# Patient Record
Sex: Female | Born: 1985 | Race: White | Hispanic: No | Marital: Single | Smoking: Current some day smoker
Health system: Southern US, Community
[De-identification: ages and names within clinical notes are randomized; demographics above are authoritative.]

## PROBLEM LIST (undated history)

## (undated) DIAGNOSIS — J45909 Unspecified asthma, uncomplicated: Secondary | ICD-10-CM

## (undated) DIAGNOSIS — R011 Cardiac murmur, unspecified: Secondary | ICD-10-CM

## (undated) HISTORY — PX: NO PAST SURGERIES: SHX2092

---
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
## 2007-06-07 ENCOUNTER — Emergency Department: Payer: Self-pay | Admitting: Emergency Medicine

## 2007-12-30 ENCOUNTER — Ambulatory Visit: Payer: Self-pay | Admitting: Family Medicine

## 2008-01-09 ENCOUNTER — Emergency Department: Payer: Self-pay | Admitting: Emergency Medicine

## 2008-05-23 ENCOUNTER — Emergency Department: Payer: Self-pay | Admitting: Internal Medicine

## 2009-03-11 ENCOUNTER — Emergency Department: Payer: Self-pay | Admitting: Unknown Physician Specialty

## 2010-01-29 ENCOUNTER — Emergency Department: Payer: Self-pay | Admitting: Internal Medicine

## 2010-02-27 ENCOUNTER — Emergency Department: Payer: Self-pay | Admitting: Emergency Medicine

## 2010-08-21 ENCOUNTER — Emergency Department: Payer: Self-pay | Admitting: *Deleted

## 2012-02-19 ENCOUNTER — Emergency Department: Payer: Self-pay | Admitting: Emergency Medicine

## 2012-03-26 ENCOUNTER — Emergency Department: Payer: Self-pay

## 2012-03-26 LAB — URINALYSIS, COMPLETE
Glucose,UR: NEGATIVE mg/dL (ref 0–75)
Ketone: NEGATIVE
Nitrite: POSITIVE

## 2012-05-05 ENCOUNTER — Emergency Department: Payer: Self-pay | Admitting: Emergency Medicine

## 2012-05-05 LAB — URINALYSIS, COMPLETE
Bilirubin,UR: NEGATIVE
Nitrite: NEGATIVE
Ph: 8 (ref 4.5–8.0)
Protein: NEGATIVE

## 2012-05-05 LAB — CBC
HCT: 32.5 % — ABNORMAL LOW (ref 35.0–47.0)
MCHC: 35.3 g/dL (ref 32.0–36.0)
MCV: 75 fL — ABNORMAL LOW (ref 80–100)
WBC: 11.6 10*3/uL — ABNORMAL HIGH (ref 3.6–11.0)

## 2012-05-05 LAB — COMPREHENSIVE METABOLIC PANEL
Albumin: 3.4 g/dL (ref 3.4–5.0)
Alkaline Phosphatase: 84 U/L (ref 50–136)
BUN: 6 mg/dL — ABNORMAL LOW (ref 7–18)
Bilirubin,Total: 0.8 mg/dL (ref 0.2–1.0)
Calcium, Total: 9.2 mg/dL (ref 8.5–10.1)
Co2: 27 mmol/L (ref 21–32)
EGFR (African American): >60
Potassium: 3.7 mmol/L (ref 3.5–5.1)
SGOT(AST): 25 U/L (ref 15–37)
Sodium: 136 mmol/L (ref 136–145)

## 2012-08-18 ENCOUNTER — Emergency Department: Payer: Self-pay | Admitting: Emergency Medicine

## 2012-08-18 LAB — COMPREHENSIVE METABOLIC PANEL
Alkaline Phosphatase: 103 U/L (ref 50–136)
Anion Gap: 4 — ABNORMAL LOW (ref 7–16)
Calcium, Total: 9.2 mg/dL (ref 8.5–10.1)
Chloride: 102 mmol/L (ref 98–107)
Co2: 28 mmol/L (ref 21–32)
Creatinine: 0.7 mg/dL (ref 0.60–1.30)
EGFR (Non-African Amer.): >60
Glucose: 92 mg/dL (ref 65–99)
Osmolality: 265 (ref 275–301)
SGOT(AST): 17 U/L (ref 15–37)
Sodium: 134 mmol/L — ABNORMAL LOW (ref 136–145)

## 2012-08-18 LAB — CBC
MCHC: 34.2 g/dL (ref 32.0–36.0)
Platelet: 117 10*3/uL — ABNORMAL LOW (ref 150–440)
RDW: 15.1 % — ABNORMAL HIGH (ref 11.5–14.5)
WBC: 11.4 10*3/uL — ABNORMAL HIGH (ref 3.6–11.0)

## 2012-09-11 ENCOUNTER — Encounter (HOSPITAL_COMMUNITY): Payer: Self-pay | Admitting: *Deleted

## 2012-09-11 ENCOUNTER — Inpatient Hospital Stay (HOSPITAL_COMMUNITY): Payer: Medicaid Other

## 2012-09-11 ENCOUNTER — Inpatient Hospital Stay: Admit: 2012-09-11 | Payer: Medicaid Other

## 2012-09-11 ENCOUNTER — Encounter (HOSPITAL_COMMUNITY): Payer: Self-pay | Admitting: Certified Registered"

## 2012-09-11 ENCOUNTER — Inpatient Hospital Stay (HOSPITAL_COMMUNITY): Payer: Medicaid Other | Admitting: Certified Registered"

## 2012-09-11 ENCOUNTER — Encounter (HOSPITAL_COMMUNITY)
Admission: EM | Disposition: A | Discharge status: Hospice | Payer: Self-pay | Source: Other Acute Inpatient Hospital | Attending: Neurosurgery

## 2012-09-11 ENCOUNTER — Emergency Department: Payer: Self-pay | Admitting: Internal Medicine

## 2012-09-11 ENCOUNTER — Inpatient Hospital Stay (HOSPITAL_COMMUNITY)
Admission: EM | Admit: 2012-09-11 | Discharge: 2012-10-27 | DRG: 003 | Disposition: A | Discharge status: Hospice | Payer: Medicaid Other | Source: Other Acute Inpatient Hospital | Attending: Neurosurgery | Admitting: Neurosurgery

## 2012-09-11 DIAGNOSIS — E876 Hypokalemia: Secondary | ICD-10-CM

## 2012-09-11 DIAGNOSIS — R000 Tachycardia, unspecified: Secondary | ICD-10-CM | POA: Diagnosis not present

## 2012-09-11 DIAGNOSIS — I82609 Acute embolism and thrombosis of unspecified veins of unspecified upper extremity: Secondary | ICD-10-CM | POA: Diagnosis not present

## 2012-09-11 DIAGNOSIS — J9600 Acute respiratory failure, unspecified whether with hypoxia or hypercapnia: Secondary | ICD-10-CM | POA: Diagnosis not present

## 2012-09-11 DIAGNOSIS — R4182 Altered mental status, unspecified: Secondary | ICD-10-CM

## 2012-09-11 DIAGNOSIS — Z930 Tracheostomy status: Secondary | ICD-10-CM

## 2012-09-11 DIAGNOSIS — E46 Unspecified protein-calorie malnutrition: Secondary | ICD-10-CM | POA: Diagnosis not present

## 2012-09-11 DIAGNOSIS — G911 Obstructive hydrocephalus: Secondary | ICD-10-CM | POA: Diagnosis not present

## 2012-09-11 DIAGNOSIS — Q249 Congenital malformation of heart, unspecified: Secondary | ICD-10-CM

## 2012-09-11 DIAGNOSIS — N390 Urinary tract infection, site not specified: Secondary | ICD-10-CM | POA: Diagnosis not present

## 2012-09-11 DIAGNOSIS — R509 Fever, unspecified: Secondary | ICD-10-CM | POA: Diagnosis not present

## 2012-09-11 DIAGNOSIS — R1310 Dysphagia, unspecified: Secondary | ICD-10-CM | POA: Diagnosis not present

## 2012-09-11 DIAGNOSIS — I619 Nontraumatic intracerebral hemorrhage, unspecified: Secondary | ICD-10-CM | POA: Diagnosis not present

## 2012-09-11 DIAGNOSIS — L8995 Pressure ulcer of unspecified site, unstageable: Secondary | ICD-10-CM | POA: Diagnosis not present

## 2012-09-11 DIAGNOSIS — R578 Other shock: Secondary | ICD-10-CM | POA: Diagnosis not present

## 2012-09-11 DIAGNOSIS — I609 Nontraumatic subarachnoid hemorrhage, unspecified: Principal | ICD-10-CM | POA: Diagnosis present

## 2012-09-11 DIAGNOSIS — L89109 Pressure ulcer of unspecified part of back, unspecified stage: Secondary | ICD-10-CM | POA: Diagnosis not present

## 2012-09-11 DIAGNOSIS — H5702 Anisocoria: Secondary | ICD-10-CM | POA: Diagnosis not present

## 2012-09-11 DIAGNOSIS — I615 Nontraumatic intracerebral hemorrhage, intraventricular: Secondary | ICD-10-CM

## 2012-09-11 DIAGNOSIS — J189 Pneumonia, unspecified organism: Secondary | ICD-10-CM | POA: Diagnosis not present

## 2012-09-11 DIAGNOSIS — Z515 Encounter for palliative care: Secondary | ICD-10-CM

## 2012-09-11 DIAGNOSIS — D649 Anemia, unspecified: Secondary | ICD-10-CM | POA: Diagnosis not present

## 2012-09-11 DIAGNOSIS — R32 Unspecified urinary incontinence: Secondary | ICD-10-CM | POA: Diagnosis not present

## 2012-09-11 DIAGNOSIS — I5032 Chronic diastolic (congestive) heart failure: Secondary | ICD-10-CM | POA: Diagnosis present

## 2012-09-11 DIAGNOSIS — J9811 Atelectasis: Secondary | ICD-10-CM

## 2012-09-11 DIAGNOSIS — G8190 Hemiplegia, unspecified affecting unspecified side: Secondary | ICD-10-CM | POA: Diagnosis present

## 2012-09-11 DIAGNOSIS — R627 Adult failure to thrive: Secondary | ICD-10-CM

## 2012-09-11 DIAGNOSIS — IMO0002 Reserved for concepts with insufficient information to code with codable children: Secondary | ICD-10-CM | POA: Diagnosis not present

## 2012-09-11 DIAGNOSIS — J95821 Acute postprocedural respiratory failure: Secondary | ICD-10-CM | POA: Diagnosis not present

## 2012-09-11 DIAGNOSIS — G936 Cerebral edema: Secondary | ICD-10-CM | POA: Diagnosis present

## 2012-09-11 DIAGNOSIS — J690 Pneumonitis due to inhalation of food and vomit: Secondary | ICD-10-CM | POA: Diagnosis present

## 2012-09-11 DIAGNOSIS — F17200 Nicotine dependence, unspecified, uncomplicated: Secondary | ICD-10-CM | POA: Diagnosis present

## 2012-09-11 DIAGNOSIS — T17308A Unspecified foreign body in larynx causing other injury, initial encounter: Secondary | ICD-10-CM | POA: Diagnosis not present

## 2012-09-11 DIAGNOSIS — J449 Chronic obstructive pulmonary disease, unspecified: Secondary | ICD-10-CM | POA: Diagnosis present

## 2012-09-11 DIAGNOSIS — R64 Cachexia: Secondary | ICD-10-CM | POA: Diagnosis not present

## 2012-09-11 DIAGNOSIS — Z66 Do not resuscitate: Secondary | ICD-10-CM | POA: Diagnosis not present

## 2012-09-11 DIAGNOSIS — R569 Unspecified convulsions: Secondary | ICD-10-CM | POA: Diagnosis not present

## 2012-09-11 DIAGNOSIS — J4489 Other specified chronic obstructive pulmonary disease: Secondary | ICD-10-CM | POA: Diagnosis present

## 2012-09-11 DIAGNOSIS — R011 Cardiac murmur, unspecified: Secondary | ICD-10-CM | POA: Diagnosis present

## 2012-09-11 DIAGNOSIS — D696 Thrombocytopenia, unspecified: Secondary | ICD-10-CM | POA: Diagnosis not present

## 2012-09-11 DIAGNOSIS — E236 Other disorders of pituitary gland: Secondary | ICD-10-CM | POA: Diagnosis not present

## 2012-09-11 DIAGNOSIS — I10 Essential (primary) hypertension: Secondary | ICD-10-CM | POA: Diagnosis present

## 2012-09-11 HISTORY — PX: RADIOLOGY WITH ANESTHESIA: SHX6223

## 2012-09-11 HISTORY — PX: CRANIOTOMY: SHX93

## 2012-09-11 HISTORY — DX: Unspecified asthma, uncomplicated: J45.909

## 2012-09-11 HISTORY — DX: Cardiac murmur, unspecified: R01.1

## 2012-09-11 LAB — URINALYSIS, COMPLETE
Blood: NEGATIVE
Glucose,UR: >=500 mg/dL (ref 0–75)
Ketone: NEGATIVE
Leukocyte Esterase: NEGATIVE
Nitrite: NEGATIVE
Ph: 8 (ref 4.5–8.0)
Protein: 30
RBC,UR: 1 /HPF (ref 0–5)
Specific Gravity: 1.012 (ref 1.003–1.030)
Transitional Epi: <1
WBC UR: 3 /HPF (ref 0–5)

## 2012-09-11 LAB — COMPREHENSIVE METABOLIC PANEL
AST: 22 U/L (ref 0–37)
Albumin: 2.9 g/dL — ABNORMAL LOW (ref 3.4–5.0)
Alkaline Phosphatase: 129 U/L (ref 50–136)
Anion Gap: 11 (ref 7–16)
Bilirubin,Total: 0.9 mg/dL (ref 0.2–1.0)
CO2: 26 mEq/L (ref 19–32)
Calcium, Total: 8.6 mg/dL (ref 8.5–10.1)
Calcium: 9.1 mg/dL (ref 8.4–10.5)
Co2: 21 mmol/L (ref 21–32)
Creatinine, Ser: 0.5 mg/dL (ref 0.50–1.10)
Creatinine: 0.8 mg/dL (ref 0.60–1.30)
EGFR (African American): >60
EGFR (Non-African Amer.): >60
GFR calc Af Amer: >90 mL/min (ref >90)
GFR calc non Af Amer: >90 mL/min (ref >90)
Glucose, Bld: 163 mg/dL — ABNORMAL HIGH (ref 70–99)
Potassium: 4 mmol/L (ref 3.5–5.1)
Sodium: 128 mmol/L — ABNORMAL LOW (ref 136–145)
Total Protein: 9.2 g/dL — ABNORMAL HIGH (ref 6.4–8.2)
Total Protein: 8.8 g/dL — ABNORMAL HIGH (ref 6.0–8.3)

## 2012-09-11 LAB — CBC
HCT: 30.4 % — ABNORMAL LOW (ref 35.0–47.0)
MCH: 24.5 pg — ABNORMAL LOW (ref 26.0–34.0)
MCHC: 33.5 g/dL (ref 32.0–36.0)
MCV: 73 fL — ABNORMAL LOW (ref 80–100)
MCV: 70 fL — ABNORMAL LOW (ref 78.0–100.0)
Platelet: 112 10*3/uL — ABNORMAL LOW (ref 150–440)
Platelets: 100 10*3/uL — ABNORMAL LOW (ref 150–400)
RDW: 16.1 % — ABNORMAL HIGH (ref 11.5–15.5)
WBC: 15.7 10*3/uL — ABNORMAL HIGH (ref 4.0–10.5)

## 2012-09-11 LAB — PROTIME-INR: Prothrombin Time: 14.6 seconds (ref 11.6–15.2)

## 2012-09-11 LAB — URINALYSIS, ROUTINE W REFLEX MICROSCOPIC
Nitrite: NEGATIVE
Specific Gravity, Urine: 1.01 (ref 1.005–1.030)
Urobilinogen, UA: 2 mg/dL — ABNORMAL HIGH (ref 0.0–1.0)

## 2012-09-11 LAB — URINE MICROSCOPIC-ADD ON

## 2012-09-11 LAB — PREGNANCY, URINE: Preg Test, Ur: NEGATIVE

## 2012-09-11 LAB — DRUG SCREEN, URINE
Amphetamines, Ur Screen: NEGATIVE (ref ?–1000)
Barbiturates, Ur Screen: NEGATIVE (ref ?–200)
Benzodiazepine, Ur Scrn: NEGATIVE (ref ?–200)
Methadone, Ur Screen: NEGATIVE (ref ?–300)
Opiate, Ur Screen: NEGATIVE (ref ?–300)
Phencyclidine (PCP) Ur S: NEGATIVE (ref ?–25)
Tricyclic, Ur Screen: NEGATIVE (ref ?–1000)

## 2012-09-11 LAB — ETHANOL
Ethanol %: <0.003 % (ref 0.000–0.080)
Ethanol: <3 mg/dL

## 2012-09-11 LAB — APTT: aPTT: 34 seconds (ref 24–37)

## 2012-09-11 SURGERY — CRANIOTOMY INTRACRANIAL ANEURYSM FOR CAROTID
Anesthesia: General | Site: Head | Laterality: Right | Wound class: Clean

## 2012-09-11 SURGERY — RADIOLOGY WITH ANESTHESIA
Anesthesia: General

## 2012-09-11 MED ORDER — THROMBIN 20000 UNITS EX KIT
PACK | CUTANEOUS | Status: DC | PRN
  Administered 2012-09-11: 20:00:00 via TOPICAL

## 2012-09-11 MED ORDER — MANNITOL 25 % IV SOLN
INTRAVENOUS | Status: DC | PRN
  Administered 2012-09-11 (×2): 25 g via INTRAVENOUS

## 2012-09-11 MED ORDER — DEXAMETHASONE SODIUM PHOSPHATE 10 MG/ML IJ SOLN
INTRAMUSCULAR | Status: AC
  Filled 2012-09-11: qty 1

## 2012-09-11 MED ORDER — IODIXANOL 320 MG/ML IV SOLN
100.0000 mL | Freq: Once | INTRAVENOUS | Status: AC | PRN
  Administered 2012-09-11: 25 mL via INTRAVENOUS

## 2012-09-11 MED ORDER — ACETAMINOPHEN 325 MG PO TABS
650.0000 mg | ORAL_TABLET | ORAL | Status: DC | PRN
  Administered 2012-09-15 – 2012-09-26 (×13): 650 mg via ORAL
  Filled 2012-09-11 (×15): qty 2

## 2012-09-11 MED ORDER — INDOCYANINE GREEN 25 MG IV SOLR
INTRAVENOUS | Status: DC | PRN
  Administered 2012-09-11 (×2): 12.5 mg via INTRAVENOUS

## 2012-09-11 MED ORDER — PROMETHAZINE HCL 25 MG/ML IJ SOLN: 12.5000 mg | INTRAMUSCULAR | Status: DC | PRN

## 2012-09-11 MED ORDER — NIMODIPINE 60 MG/20ML PO SOLN
60.0000 mg | ORAL | Status: DC
  Administered 2012-09-12 (×2): 60 mg
  Filled 2012-09-11 (×11): qty 20

## 2012-09-11 MED ORDER — MICROFIBRILLAR COLL HEMOSTAT EX PADS
MEDICATED_PAD | CUTANEOUS | Status: DC | PRN
  Administered 2012-09-11: 1 via TOPICAL

## 2012-09-11 MED ORDER — ONDANSETRON HCL 4 MG/2ML IJ SOLN
4.0000 mg | Freq: Four times a day (QID) | INTRAMUSCULAR | Status: DC | PRN
  Administered 2012-10-13: 4 mg via INTRAVENOUS
  Filled 2012-09-11: qty 2

## 2012-09-11 MED ORDER — SODIUM CHLORIDE 0.9 % IV SOLN
INTRAVENOUS | Status: DC
  Administered 2012-09-12 – 2012-09-16 (×2): via INTRAVENOUS

## 2012-09-11 MED ORDER — PROPOFOL 10 MG/ML IV BOLUS
INTRAVENOUS | Status: DC | PRN
  Administered 2012-09-11: 20 mg via INTRAVENOUS
  Administered 2012-09-11: 60 mg via INTRAVENOUS
  Administered 2012-09-11: 20 mg via INTRAVENOUS

## 2012-09-11 MED ORDER — MIDAZOLAM HCL 5 MG/5ML IJ SOLN
INTRAMUSCULAR | Status: DC | PRN
  Administered 2012-09-11: 2 mg via INTRAVENOUS

## 2012-09-11 MED ORDER — VERAPAMIL HCL 2.5 MG/ML IV SOLN
INTRAVENOUS | Status: AC
  Filled 2012-09-11: qty 4

## 2012-09-11 MED ORDER — DIPHENHYDRAMINE HCL 50 MG/ML IJ SOLN
25.0000 mg | Freq: Once | INTRAMUSCULAR | Status: AC
  Administered 2012-09-11: 25 mg via INTRAVENOUS
  Filled 2012-09-11: qty 1

## 2012-09-11 MED ORDER — SUCCINYLCHOLINE CHLORIDE 20 MG/ML IJ SOLN
INTRAMUSCULAR | Status: DC | PRN
  Administered 2012-09-11: 90 mg via INTRAVENOUS

## 2012-09-11 MED ORDER — FENTANYL CITRATE 0.05 MG/ML IJ SOLN
INTRAMUSCULAR | Status: DC | PRN
  Administered 2012-09-11 (×2): 50 ug via INTRAVENOUS
  Administered 2012-09-11 (×2): 100 ug via INTRAVENOUS
  Administered 2012-09-11: 50 ug via INTRAVENOUS

## 2012-09-11 MED ORDER — PHENYLEPHRINE HCL 10 MG/ML IJ SOLN
10.0000 mg | INTRAVENOUS | Status: DC | PRN
  Administered 2012-09-11: 50 ug/min via INTRAVENOUS

## 2012-09-11 MED ORDER — LIDOCAINE HCL (CARDIAC) 20 MG/ML IV SOLN
INTRAVENOUS | Status: DC | PRN
  Administered 2012-09-11: 90 mg via INTRAVENOUS

## 2012-09-11 MED ORDER — METHYLPREDNISOLONE SODIUM SUCC 125 MG IJ SOLR
125.0000 mg | Freq: Once | INTRAMUSCULAR | Status: AC
  Administered 2012-09-11: 125 mg via INTRAVENOUS
  Filled 2012-09-11: qty 2

## 2012-09-11 MED ORDER — SENNOSIDES-DOCUSATE SODIUM 8.6-50 MG PO TABS
1.0000 | ORAL_TABLET | Freq: Two times a day (BID) | ORAL | Status: DC
  Administered 2012-09-12 – 2012-10-03 (×7): 1 via ORAL
  Filled 2012-09-11 (×48): qty 1

## 2012-09-11 MED ORDER — THROMBIN 5000 UNITS EX SOLR
OROMUCOSAL | Status: DC | PRN
  Administered 2012-09-11: 21:00:00 via TOPICAL

## 2012-09-11 MED ORDER — CEFAZOLIN SODIUM-DEXTROSE 2-3 GM-% IV SOLR
INTRAVENOUS | Status: AC
  Administered 2012-09-11: 2 g via INTRAVENOUS
  Administered 2012-09-11: 1 g via INTRAVENOUS
  Filled 2012-09-11: qty 50

## 2012-09-11 MED ORDER — CEFAZOLIN SODIUM 1-5 GM-% IV SOLN
INTRAVENOUS | Status: AC
  Filled 2012-09-11: qty 50

## 2012-09-11 MED ORDER — HEMOSTATIC AGENTS (NO CHARGE) OPTIME
TOPICAL | Status: DC | PRN
  Administered 2012-09-11 (×2): 1 via TOPICAL

## 2012-09-11 MED ORDER — 0.9 % SODIUM CHLORIDE (POUR BTL) OPTIME
TOPICAL | Status: DC | PRN
  Administered 2012-09-11 (×2): 1000 mL

## 2012-09-11 MED ORDER — DEXAMETHASONE SODIUM PHOSPHATE 10 MG/ML IJ SOLN
6.0000 mg | Freq: Once | INTRAMUSCULAR | Status: AC
  Administered 2012-09-11: 6 mg via INTRAVENOUS

## 2012-09-11 MED ORDER — IOHEXOL 350 MG/ML SOLN
50.0000 mL | Freq: Once | INTRAVENOUS | Status: AC | PRN
  Administered 2012-09-11: 50 mL via INTRAVENOUS

## 2012-09-11 MED ORDER — ROCURONIUM BROMIDE 100 MG/10ML IV SOLN
INTRAVENOUS | Status: DC | PRN
  Administered 2012-09-11: 30 mg via INTRAVENOUS
  Administered 2012-09-11: 20 mg via INTRAVENOUS

## 2012-09-11 MED ORDER — ROCURONIUM BROMIDE 100 MG/10ML IV SOLN
INTRAVENOUS | Status: DC | PRN
  Administered 2012-09-11: 50 mg via INTRAVENOUS

## 2012-09-11 MED ORDER — SODIUM CHLORIDE 0.9 % IR SOLN
Status: DC | PRN
  Administered 2012-09-11: 20:00:00

## 2012-09-11 MED ORDER — SODIUM CHLORIDE 0.9 % IV SOLN
INTRAVENOUS | Status: DC | PRN
  Administered 2012-09-11 (×3): via INTRAVENOUS

## 2012-09-11 MED ORDER — ACETAMINOPHEN 650 MG RE SUPP
650.0000 mg | RECTAL | Status: DC | PRN
  Administered 2012-09-17 – 2012-10-18 (×4): 650 mg via RECTAL
  Filled 2012-09-11 (×5): qty 1

## 2012-09-11 MED ORDER — INDOCYANINE GREEN 25 MG IV SOLR
25.0000 mg | Freq: Once | INTRAVENOUS | Status: DC
  Filled 2012-09-11: qty 25

## 2012-09-11 MED ORDER — PAPAVERINE HCL 30 MG/ML IJ SOLN
60.0000 mg | Freq: Once | INTRAMUSCULAR | Status: DC
  Filled 2012-09-11: qty 2

## 2012-09-11 MED ORDER — LABETALOL HCL 5 MG/ML IV SOLN: 10.0000 mg | INTRAVENOUS | Status: DC | PRN

## 2012-09-11 MED ORDER — SODIUM CHLORIDE 0.9 % IV SOLN
INTRAVENOUS | Status: DC | PRN
  Administered 2012-09-11: 23:00:00 via INTRAVENOUS

## 2012-09-11 MED ORDER — FENTANYL CITRATE 0.05 MG/ML IJ SOLN
INTRAMUSCULAR | Status: DC | PRN
  Administered 2012-09-11: 100 ug via INTRAVENOUS
  Administered 2012-09-11: 50 ug via INTRAVENOUS

## 2012-09-11 MED ORDER — VECURONIUM BROMIDE 10 MG IV SOLR
INTRAVENOUS | Status: DC | PRN
  Administered 2012-09-11: 1 mg via INTRAVENOUS
  Administered 2012-09-11: 2 mg via INTRAVENOUS

## 2012-09-11 MED ORDER — IOHEXOL 300 MG/ML  SOLN
150.0000 mL | Freq: Once | INTRAMUSCULAR | Status: AC | PRN
  Administered 2012-09-11: 60 mL via INTRA_ARTERIAL

## 2012-09-11 MED ORDER — PROPOFOL 10 MG/ML IV BOLUS
INTRAVENOUS | Status: DC | PRN
  Administered 2012-09-11: 90 mg via INTRAVENOUS

## 2012-09-11 MED ORDER — PANTOPRAZOLE SODIUM 40 MG IV SOLR
40.0000 mg | Freq: Every day | INTRAVENOUS | Status: DC
  Administered 2012-09-12 – 2012-09-20 (×9): 40 mg via INTRAVENOUS
  Filled 2012-09-11 (×10): qty 40

## 2012-09-11 MED ORDER — PROMETHAZINE HCL 25 MG PO TABS: 12.5000 mg | ORAL_TABLET | ORAL | Status: DC | PRN

## 2012-09-11 MED ORDER — NIMODIPINE 30 MG PO CAPS
60.0000 mg | ORAL_CAPSULE | ORAL | Status: DC
  Filled 2012-09-11 (×11): qty 2

## 2012-09-11 MED ORDER — LACTATED RINGERS IV SOLN: INTRAVENOUS | Status: DC | PRN

## 2012-09-11 MED ORDER — MORPHINE SULFATE 2 MG/ML IJ SOLN
1.0000 mg | INTRAMUSCULAR | Status: DC | PRN
  Administered 2012-09-12: 1 mg via INTRAVENOUS
  Administered 2012-09-12: 2 mg via INTRAVENOUS
  Administered 2012-09-13: 1 mg via INTRAVENOUS
  Filled 2012-09-11 (×3): qty 1

## 2012-09-11 MED ORDER — SODIUM CHLORIDE 0.9 % IV SOLN
500.0000 mg | Freq: Two times a day (BID) | INTRAVENOUS | Status: DC
  Administered 2012-09-11 – 2012-09-27 (×33): 500 mg via INTRAVENOUS
  Filled 2012-09-11 (×34): qty 5

## 2012-09-11 MED ORDER — VECURONIUM BROMIDE 10 MG IV SOLR
INTRAVENOUS | Status: DC | PRN
  Administered 2012-09-11: 2 mg via INTRAVENOUS
  Administered 2012-09-11: 1 mg via INTRAVENOUS
  Administered 2012-09-11 (×2): 2 mg via INTRAVENOUS
  Administered 2012-09-11: 3 mg via INTRAVENOUS

## 2012-09-11 SURGICAL SUPPLY — 101 items
APL SKNCLS STERI-STRIP NONHPOA (GAUZE/BANDAGES/DRESSINGS)
BANDAGE GAUZE 4  KLING STR (GAUZE/BANDAGES/DRESSINGS) ×2 IMPLANT
BANDAGE GAUZE ELAST BULKY 4 IN (GAUZE/BANDAGES/DRESSINGS) ×2 IMPLANT
BENZOIN TINCTURE PRP APPL 2/3 (GAUZE/BANDAGES/DRESSINGS) IMPLANT
BIT DRILL WIRE PASS 1.3MM (BIT) IMPLANT
BLADE SAW GIGLI 16 STRL (MISCELLANEOUS) IMPLANT
BLADE SURG 11 STRL SS (BLADE) ×1 IMPLANT
BLADE SURG 15 STRL LF DISP TIS (BLADE) ×1 IMPLANT
BLADE SURG 15 STRL SS (BLADE) ×2
BLADE ULTRA TIP 2M (BLADE) IMPLANT
BRUSH SCRUB EZ 1% IODOPHOR (MISCELLANEOUS) ×1 IMPLANT
BRUSH SCRUB EZ PLAIN DRY (MISCELLANEOUS) ×2 IMPLANT
BUR ACORN 6.0 PRECISION (BURR) ×2 IMPLANT
BUR ADDG 1.1 (BURR) IMPLANT
BUR MATCHSTICK NEURO 3.0 LAGG (BURR) ×1 IMPLANT
BUR ROUND FLUTED 4 SOFT TCH (BURR) ×1 IMPLANT
BUR ROUTER D-58 CRANI (BURR) ×1 IMPLANT
CANISTER SUCTION 2500CC (MISCELLANEOUS) ×4 IMPLANT
CATH VENTRIC 35X38 W/TROCAR LG (CATHETERS) ×1 IMPLANT
CLIP ANEURY YASARGIL (Clip) ×2 IMPLANT
CLIP TI MEDIUM 6 (CLIP) IMPLANT
CLOTH BEACON ORANGE TIMEOUT ST (SAFETY) ×2 IMPLANT
CONT SPEC 4OZ CLIKSEAL STRL BL (MISCELLANEOUS) ×2 IMPLANT
CORDS BIPOLAR (ELECTRODE) ×2 IMPLANT
COVER MAYO STAND STRL (DRAPES) ×2 IMPLANT
DECANTER SPIKE VIAL GLASS SM (MISCELLANEOUS) ×2 IMPLANT
DRAIN SNY WOU 7FLT (WOUND CARE) IMPLANT
DRAPE MICROSCOPE LEICA (MISCELLANEOUS) ×2 IMPLANT
DRAPE NEUROLOGICAL W/INCISE (DRAPES) ×2 IMPLANT
DRAPE WARM FLUID 44X44 (DRAPE) ×2 IMPLANT
DRESSING TELFA 8X3 (GAUZE/BANDAGES/DRESSINGS) IMPLANT
DRILL WIRE PASS 1.3MM (BIT)
DRSG ADAPTIC 3X8 NADH LF (GAUZE/BANDAGES/DRESSINGS) ×2 IMPLANT
DURAMATRIX ONLAY 3X3 (Plate) ×1 IMPLANT
DURAPREP 6ML APPLICATOR 50/CS (WOUND CARE) ×2 IMPLANT
ELECT CAUTERY BLADE 6.4 (BLADE) ×2 IMPLANT
ELECT REM PT RETURN 9FT ADLT (ELECTROSURGICAL) ×2
ELECTRODE REM PT RTRN 9FT ADLT (ELECTROSURGICAL) ×1 IMPLANT
EVACUATOR SILICONE 100CC (DRAIN) IMPLANT
FORCEPS BIPOLAR SPETZLER 8 1.0 (NEUROSURGERY SUPPLIES) ×1 IMPLANT
GAUZE SPONGE 4X4 16PLY XRAY LF (GAUZE/BANDAGES/DRESSINGS) IMPLANT
GLOVE BIOGEL M 7.0 STRL (GLOVE) ×2 IMPLANT
GLOVE BIOGEL PI IND STRL 7.5 (GLOVE) ×1 IMPLANT
GLOVE BIOGEL PI INDICATOR 7.5 (GLOVE) ×2
GLOVE ECLIPSE 7.0 STRL STRAW (GLOVE) ×4 IMPLANT
GLOVE ECLIPSE 7.5 STRL STRAW (GLOVE) ×1 IMPLANT
GLOVE EXAM NITRILE LRG STRL (GLOVE) IMPLANT
GLOVE EXAM NITRILE MD LF STRL (GLOVE) IMPLANT
GLOVE EXAM NITRILE XL STR (GLOVE) IMPLANT
GLOVE EXAM NITRILE XS STR PU (GLOVE) IMPLANT
GOWN BRE IMP SLV AUR LG STRL (GOWN DISPOSABLE) ×5 IMPLANT
GOWN BRE IMP SLV AUR XL STRL (GOWN DISPOSABLE) ×1 IMPLANT
GOWN STRL REIN 2XL LVL4 (GOWN DISPOSABLE) IMPLANT
HEMOSTAT SURGICEL 2X14 (HEMOSTASIS) ×2 IMPLANT
HOOK DURA (MISCELLANEOUS) ×2 IMPLANT
KIT BASIN OR (CUSTOM PROCEDURE TRAY) ×2 IMPLANT
KIT DRAIN CSF ACCUDRAIN (MISCELLANEOUS) ×1 IMPLANT
KIT ROOM TURNOVER OR (KITS) ×2 IMPLANT
KNIFE ARACHNOID DISP AM-24-S (MISCELLANEOUS) ×1 IMPLANT
NDL BLUNT 18X1 FOR OR ONLY (NEEDLE) IMPLANT
NDL HYPO 25X1 1.5 SAFETY (NEEDLE) ×1 IMPLANT
NEEDLE BLUNT 18X1 FOR OR ONLY (NEEDLE) ×2 IMPLANT
NEEDLE HYPO 25X1 1.5 SAFETY (NEEDLE) ×4 IMPLANT
NS IRRIG 1000ML POUR BTL (IV SOLUTION) ×4 IMPLANT
PACK CRANIOTOMY (CUSTOM PROCEDURE TRAY) ×2 IMPLANT
PAD ARMBOARD 7.5X6 YLW CONV (MISCELLANEOUS) ×4 IMPLANT
PATTIES SURGICAL .25X.25 (GAUZE/BANDAGES/DRESSINGS) IMPLANT
PATTIES SURGICAL .5 X.5 (GAUZE/BANDAGES/DRESSINGS) IMPLANT
PATTIES SURGICAL .5 X3 (DISPOSABLE) IMPLANT
PATTIES SURGICAL 1/4 X 3 (GAUZE/BANDAGES/DRESSINGS) IMPLANT
PATTIES SURGICAL 1X1 (DISPOSABLE) IMPLANT
PIN MAYFIELD SKULL DISP (PIN) IMPLANT
PLATE 1.5  2HOLE LNG NEURO (Plate) ×2 IMPLANT
PLATE 1.5 2HOLE LNG NEURO (Plate) IMPLANT
PLATE 1.5 5HOLE XLONG Y (Plate) ×1 IMPLANT
RUBBERBAND STERILE (MISCELLANEOUS) ×4 IMPLANT
SCREW SELF DRILL HT 1.5/4MM (Screw) ×9 IMPLANT
SPONGE GAUZE 4X4 12PLY (GAUZE/BANDAGES/DRESSINGS) ×1 IMPLANT
SPONGE NEURO XRAY DETECT 1X3 (DISPOSABLE) IMPLANT
SPONGE SURGIFOAM ABS GEL 100 (HEMOSTASIS) ×2 IMPLANT
SPONGE SURGIFOAM ABS GEL 100C (HEMOSTASIS) IMPLANT
STAPLER VISISTAT 35W (STAPLE) ×3 IMPLANT
STOCKINETTE 6  STRL (DRAPES) ×1
STOCKINETTE 6 STRL (DRAPES) ×1 IMPLANT
SUT ETHILON 3 0 FSL (SUTURE) IMPLANT
SUT NURALON 4 0 TR CR/8 (SUTURE) ×5 IMPLANT
SUT VIC AB 0 CT1 18XCR BRD8 (SUTURE) IMPLANT
SUT VIC AB 0 CT1 8-18 (SUTURE) ×4
SUT VIC AB 2-0 CT2 18 VCP726D (SUTURE) ×4 IMPLANT
SUT VIC AB 3-0 SH 8-18 (SUTURE) ×2 IMPLANT
SYR 20ML ECCENTRIC (SYRINGE) ×2 IMPLANT
SYR 3ML LL SCALE MARK (SYRINGE) ×1 IMPLANT
SYR CONTROL 10ML LL (SYRINGE) ×2 IMPLANT
TAPE CLOTH 1X10 TAN NS (GAUZE/BANDAGES/DRESSINGS) ×1 IMPLANT
TIP NONSTICK .5MMX23CM (INSTRUMENTS) ×2
TIP NONSTICK .5X23 (INSTRUMENTS) IMPLANT
TOWEL OR 17X24 6PK STRL BLUE (TOWEL DISPOSABLE) ×2 IMPLANT
TOWEL OR 17X26 10 PK STRL BLUE (TOWEL DISPOSABLE) ×2 IMPLANT
TRAY FOLEY CATH 14FRSI W/METER (CATHETERS) IMPLANT
UNDERPAD 30X30 INCONTINENT (UNDERPADS AND DIAPERS) ×1 IMPLANT
WATER STERILE IRR 1000ML POUR (IV SOLUTION) ×2 IMPLANT

## 2012-09-11 NOTE — Anesthesia Procedure Notes (Signed)
Procedure Name: Intubation Date/Time: 09/11/2012 5:22 PM Performed by: Charm Barges, Ramsey Midgett R Pre-anesthesia Checklist: Patient identified, Emergency Drugs available, Suction available, Patient being monitored and Timeout performed Patient Re-evaluated:Patient Re-evaluated prior to inductionOxygen Delivery Method: Circle system utilized Preoxygenation: Pre-oxygenation with 100% oxygen Intubation Type: IV induction, Rapid sequence and Cricoid Pressure applied Laryngoscope Size: Mac and 3 Grade View: Grade I Tube type: Subglottic suction tube Number of attempts: 1 Airway Equipment and Method: Stylet Placement Confirmation: ETT inserted through vocal cords under direct vision,  positive ETCO2 and breath sounds checked- equal and bilateral Secured at: 22 cm Tube secured with: Tape Dental Injury: Teeth and Oropharynx as per pre-operative assessment

## 2012-09-11 NOTE — Anesthesia Postprocedure Evaluation (Signed)
  Anesthesia Post-op Note  Patient: Sally Nelson  Procedure(s) Performed: Procedure(s): RADIOLOGY WITH ANESTHESIA-CEREBRAL ANEURYSM COILING (N/A)  Patient Location: OR 34  Anesthesia Type:General  Level of Consciousness: unresponsive and Patient remains intubated per anesthesia plan  Airway and Oxygen Therapy: Patient remains intubated per anesthesia plan  Post-op Pain: none  Post-op Assessment: Post-op Vital signs reviewed, Patient's Cardiovascular Status Stable and Respiratory Function Stable  Post-op Vital Signs: Reviewed and stable  Complications: No apparent anesthesia complications

## 2012-09-11 NOTE — Transfer of Care (Signed)
Immediate Anesthesia Transfer of Care Note  Patient: Sally Nelson  Procedure(s) Performed: Procedure(s): RADIOLOGY WITH ANESTHESIA-CEREBRAL ANEURYSM COILING (N/A)  Patient Location: OR 34  Anesthesia Type:General  Level of Consciousness: unresponsive and Patient remains intubated per anesthesia plan  Airway & Oxygen Therapy: Patient remains intubated per anesthesia plan  Post-op Assessment: Post -op Vital signs reviewed and stable  Post vital signs: Reviewed and stable  Complications: No apparent anesthesia complications

## 2012-09-11 NOTE — Anesthesia Preprocedure Evaluation (Addendum)
Anesthesia Evaluation  Patient identified by MRN, date of birth, ID band Patient unresponsive    Reviewed: Allergy & Precautions, H&P , NPO status , Patient's Chart, lab work & pertinent test results, Unable to perform ROS - Chart review only  History of Anesthesia Complications Negative for: history of anesthetic complications  Airway Mallampati: II TM Distance: >3 FB Neck ROM: Full    Dental  (+) Teeth Intact   Pulmonary asthma , Current Smoker,    Pulmonary exam normal       Cardiovascular negative cardio ROS  Rhythm:Regular Rate:Normal + Systolic murmurs History of Thoracic Surgery as a child, unknown procedure.   Neuro/Psych negative psych ROS   GI/Hepatic negative GI ROS, Neg liver ROS,   Endo/Other  negative endocrine ROS  Renal/GU negative Renal ROS     Musculoskeletal   Abdominal   Peds  Hematology   Anesthesia Other Findings Hx of Heart surgery as an infant per father  Reproductive/Obstetrics                         Anesthesia Physical Anesthesia Plan  ASA: III and emergent  Anesthesia Plan: General   Post-op Pain Management:    Induction: Intravenous  Airway Management Planned: Oral ETT  Additional Equipment: Arterial line  Intra-op Plan:   Post-operative Plan: Possible Post-op intubation/ventilation  Informed Consent: I have reviewed the patients History and Physical, chart, labs and discussed the procedure including the risks, benefits and alternatives for the proposed anesthesia with the patient or authorized representative who has indicated his/her understanding and acceptance.     Plan Discussed with: CRNA, Anesthesiologist and Surgeon  Anesthesia Plan Comments:        Anesthesia Quick Evaluation

## 2012-09-11 NOTE — Progress Notes (Signed)
Patient ID: Sally Nelson, female   DOB: [birth year], 27 y.o.   MRN: [id number]  CC:  Altered mental status  HPI: Sally Nelson is a 27 y.o. female admitted to the NICU after being found by family this morning difficult to arouse, confused, and appeared weak on the left side. She was taken to [facility] hospital and transferred to [facility]. Per the pts father, she was normal last night, but was complaining of a HA and went to bed early.  PMH: Past Medical History  Diagnosis Date  . Heart murmur   . Asthma     PSH: Past Surgical History  Procedure Laterality Date  . No past surgeries      SH: History  Substance Use Topics  . Smoking status: Current Some Day Smoker    Types: Cigarettes  . Smokeless tobacco: Not on file  . Alcohol Use: Not on file    MEDS: Prior to Admission medications   Not on File  Father states she was prescribed a medication (poss for depression) but has not taken it.  ALLERGY: Allergies  Allergen Reactions  . Contrast Media [Iodinated Diagnostic Agents] Hives    NEUROLOGIC EXAM: Awake, intermittently responsive When responsive, oriented to person, year, place CN grossly intact Motor exam: Good strength RUE./RLE Moves LUE/LLE intermittently to command ? Slightly weaker than right  IMGAING: CT and CTA reviewed demonstrating diffuse basal SAH with R>L sylvian clot. Temporal horns visible. Possible small right MCA aneurysm seen, however CTA is of suboptimal quality.  IMPRESSION: - 27 y.o. 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  The pts CT findings and likely etiology were discussed with the pts father. The treatment plan above was reviewed including the risks and benefits of these procedures which include but are not limited to stroke, aneurysm rupture  leading to coma or death, infection. The possible need for ventriculostomy was also discussed. He understood our discussion and provided informed consent for the above procedures.

## 2012-09-11 NOTE — Anesthesia Procedure Notes (Signed)
Procedure Name: Intubation Date/Time: 09/11/2012 5:22 PM Performed by: Nicholos Johns Pre-anesthesia Checklist: Patient identified, Timeout performed, Emergency Drugs available, Suction available and Patient being monitored Patient Re-evaluated:Patient Re-evaluated prior to inductionOxygen Delivery Method: Circle system utilized Preoxygenation: Pre-oxygenation with 100% oxygen Intubation Type: IV induction Ventilation: Mask ventilation without difficulty Laryngoscope Size: Mac and 3 Grade View: Grade I Tube type: Subglottic suction tube Tube size: 7.0 mm Number of attempts: 1 Airway Equipment and Method: Stylet Placement Confirmation: ETT inserted through vocal cords under direct vision,  positive ETCO2 and breath sounds checked- equal and bilateral Secured at: 22 cm Tube secured with: Tape Dental Injury: Teeth and Oropharynx as per pre-operative assessment

## 2012-09-11 NOTE — Progress Notes (Signed)
Nursing 1430 Patient taken to CT angio per MD.  No known allergies per patient's father.  CT angio completed and contrast dye given by CT personnel.  Patient c/o facial itching while in CT.  Once back to room, RN noticed that patient had hives on her face and reported itching.  Dr. Phoebe Perch made aware.  Decadron 6mg  IVP and Benadryl 25mg  IVP given per MD order.  Will continue to assess.

## 2012-09-11 NOTE — H&P (Signed)
Subjective: Pt found to be hard to arouse this am , taken to [facility] ER, found to have SAH and pt tranfered to [facility].   Pt c/o HA.   Slurred speach  There are no active problems to display for this patient.  PMH  - HTN, COPD, ashma  No past surgical history on file.  No prescriptions prior to admission   Allergies not on file  History  Substance Use Topics  . Smoking status: Not on file  . Smokeless tobacco: Not on file  . Alcohol Use: Not on file    No family history on file.  Review of Systems +HA  - sluured speech  - otherwise unable to obtain  Objective: Vital signs in last 24 hours:   PE  - easily arouses  - OX2++  , FC all 4, slight left hemiparesis  CN II-XII intact except sl. Left facial weakness,  +neck stiffnes     Data Review CT  -cisternal  SAH, with more blood on the right , prominent ventricles \  Assessment/Plan: Pt with SAH , suspect aneurysm  - admit to ICU  - will get CTA and further plan after this  - close neuro checks  -    Clydene Fake, MD 09/11/2012 1:33 PM

## 2012-09-12 ENCOUNTER — Encounter (HOSPITAL_COMMUNITY)
Admission: EM | Disposition: A | Discharge status: Hospice | Payer: Self-pay | Source: Other Acute Inpatient Hospital | Attending: Neurosurgery

## 2012-09-12 ENCOUNTER — Inpatient Hospital Stay (HOSPITAL_COMMUNITY): Payer: Medicaid Other | Admitting: Anesthesiology

## 2012-09-12 ENCOUNTER — Inpatient Hospital Stay (HOSPITAL_COMMUNITY): Payer: Medicaid Other

## 2012-09-12 ENCOUNTER — Encounter (HOSPITAL_COMMUNITY): Payer: Self-pay | Admitting: Radiology

## 2012-09-12 ENCOUNTER — Encounter (HOSPITAL_COMMUNITY): Payer: Self-pay | Admitting: Anesthesiology

## 2012-09-12 DIAGNOSIS — J690 Pneumonitis due to inhalation of food and vomit: Secondary | ICD-10-CM | POA: Diagnosis present

## 2012-09-12 DIAGNOSIS — I609 Nontraumatic subarachnoid hemorrhage, unspecified: Secondary | ICD-10-CM | POA: Diagnosis present

## 2012-09-12 DIAGNOSIS — J9600 Acute respiratory failure, unspecified whether with hypoxia or hypercapnia: Secondary | ICD-10-CM | POA: Diagnosis not present

## 2012-09-12 HISTORY — PX: CRANIOTOMY: SHX93

## 2012-09-12 LAB — BLOOD GAS, ARTERIAL
Bicarbonate: 23 mEq/L (ref 20.0–24.0)
Drawn by: 252031
O2 Saturation: 99.5 %
PEEP: 5 cmH2O
RATE: 14 resp/min
pCO2 arterial: 35.9 mmHg (ref 35.0–45.0)
pH, Arterial: 7.423 (ref 7.350–7.450)
pO2, Arterial: 184 mmHg — ABNORMAL HIGH (ref 80.0–100.0)

## 2012-09-12 LAB — POCT I-STAT 4, (NA,K, GLUC, HGB,HCT)
Glucose, Bld: 158 mg/dL — ABNORMAL HIGH (ref 70–99)
HCT: 24 % — ABNORMAL LOW (ref 36.0–46.0)

## 2012-09-12 LAB — BASIC METABOLIC PANEL
BUN: 10 mg/dL (ref 6–23)
Chloride: 105 mEq/L (ref 96–112)
Creatinine, Ser: 0.59 mg/dL (ref 0.50–1.10)
GFR calc non Af Amer: >90 mL/min (ref >90)
Glucose, Bld: 187 mg/dL — ABNORMAL HIGH (ref 70–99)
Potassium: 4 mEq/L (ref 3.5–5.1)

## 2012-09-12 LAB — SODIUM: Sodium: 145 mEq/L (ref 135–145)

## 2012-09-12 SURGERY — CRANIOTOMY HEMATOMA EVACUATION SUBDURAL
Anesthesia: General | Site: Head | Laterality: Right | Wound class: Clean

## 2012-09-12 MED ORDER — BACITRACIN ZINC 500 UNIT/GM EX OINT
TOPICAL_OINTMENT | CUTANEOUS | Status: DC | PRN
  Administered 2012-09-12: 1 via TOPICAL

## 2012-09-12 MED ORDER — OXYCODONE HCL 5 MG/5ML PO SOLN: 5.0000 mg | Freq: Once | ORAL | Status: DC | PRN

## 2012-09-12 MED ORDER — PROPOFOL 10 MG/ML IV EMUL
5.0000 ug/kg/min | INTRAVENOUS | Status: DC
  Administered 2012-09-12: 5 ug/kg/min via INTRAVENOUS
  Filled 2012-09-12: qty 100

## 2012-09-12 MED ORDER — PROMETHAZINE HCL 25 MG/ML IJ SOLN: 6.2500 mg | INTRAMUSCULAR | Status: DC | PRN

## 2012-09-12 MED ORDER — PAPAVERINE HCL 30 MG/ML IJ SOLN
INTRAMUSCULAR | Status: DC | PRN
  Administered 2012-09-11: .5 mL via INTRAVENOUS

## 2012-09-12 MED ORDER — SODIUM CHLORIDE 4 MEQ/ML IV SOLN
30.0000 mL | INTRAVENOUS | Status: AC
  Administered 2012-09-12: 120 meq via INTRAVENOUS
  Filled 2012-09-12 (×2): qty 30

## 2012-09-12 MED ORDER — ROCURONIUM BROMIDE 100 MG/10ML IV SOLN
INTRAVENOUS | Status: DC | PRN
  Administered 2012-09-12: 30 mg via INTRAVENOUS
  Administered 2012-09-12: 20 mg via INTRAVENOUS

## 2012-09-12 MED ORDER — FENTANYL CITRATE 0.05 MG/ML IJ SOLN
INTRAMUSCULAR | Status: DC | PRN
  Administered 2012-09-12: 100 ug via INTRAVENOUS

## 2012-09-12 MED ORDER — SODIUM CHLORIDE 0.9 % IV SOLN
3.0000 g | Freq: Three times a day (TID) | INTRAVENOUS | Status: DC
  Filled 2012-09-12: qty 3

## 2012-09-12 MED ORDER — NIMODIPINE 30 MG PO CAPS
60.0000 mg | ORAL_CAPSULE | ORAL | Status: AC
  Filled 2012-09-12 (×119): qty 2

## 2012-09-12 MED ORDER — SODIUM CHLORIDE 0.9 % IV SOLN
3.0000 g | Freq: Four times a day (QID) | INTRAVENOUS | Status: DC
  Administered 2012-09-12 – 2012-09-21 (×37): 3 g via INTRAVENOUS
  Filled 2012-09-12 (×39): qty 3

## 2012-09-12 MED ORDER — HYDROMORPHONE HCL PF 1 MG/ML IJ SOLN: 0.2500 mg | INTRAMUSCULAR | Status: DC | PRN

## 2012-09-12 MED ORDER — SODIUM CHLORIDE 3 % IV SOLN
INTRAVENOUS | Status: DC
  Administered 2012-09-12: 75 mL/h via INTRAVENOUS
  Administered 2012-09-12: 20:00:00 via INTRAVENOUS
  Administered 2012-09-13: 75 mL/h via INTRAVENOUS
  Administered 2012-09-13 – 2012-09-14 (×3): via INTRAVENOUS
  Administered 2012-09-14: 75 mL/h via INTRAVENOUS
  Administered 2012-09-14 – 2012-09-18 (×10): via INTRAVENOUS
  Filled 2012-09-12 (×28): qty 500

## 2012-09-12 MED ORDER — FENTANYL CITRATE 0.05 MG/ML IJ SOLN
25.0000 ug | INTRAMUSCULAR | Status: DC | PRN
  Administered 2012-09-12 – 2012-09-13 (×6): 50 ug via INTRAVENOUS
  Administered 2012-09-13: 100 ug via INTRAVENOUS
  Administered 2012-09-14 (×3): 50 ug via INTRAVENOUS
  Administered 2012-09-14 (×3): 100 ug via INTRAVENOUS
  Administered 2012-09-15 (×3): 50 ug via INTRAVENOUS
  Administered 2012-09-15: 100 ug via INTRAVENOUS
  Administered 2012-09-16 (×2): 50 ug via INTRAVENOUS
  Administered 2012-09-18: 100 ug via INTRAVENOUS
  Administered 2012-09-18 (×4): 50 ug via INTRAVENOUS
  Administered 2012-09-19: 100 ug via INTRAVENOUS
  Administered 2012-09-19 (×2): 50 ug via INTRAVENOUS
  Administered 2012-09-19 (×2): 100 ug via INTRAVENOUS
  Administered 2012-09-21: 25 ug via INTRAVENOUS
  Administered 2012-09-24: 50 ug via INTRAVENOUS
  Administered 2012-09-25: 25 ug via INTRAVENOUS
  Administered 2012-09-26: 50 ug via INTRAVENOUS
  Administered 2012-09-26: 25 ug via INTRAVENOUS
  Filled 2012-09-12 (×38): qty 2

## 2012-09-12 MED ORDER — SODIUM CHLORIDE 0.9 % IV SOLN
INTRAVENOUS | Status: DC | PRN
  Administered 2012-09-12: 10:00:00 via INTRAVENOUS

## 2012-09-12 MED ORDER — PROPOFOL INFUSION 10 MG/ML OPTIME
INTRAVENOUS | Status: DC | PRN
  Administered 2012-09-12: 20 ug/kg/min via INTRAVENOUS

## 2012-09-12 MED ORDER — CHLORHEXIDINE GLUCONATE 0.12 % MT SOLN
15.0000 mL | Freq: Two times a day (BID) | OROMUCOSAL | Status: DC
  Administered 2012-09-12 – 2012-10-23 (×84): 15 mL via OROMUCOSAL
  Filled 2012-09-12 (×87): qty 15

## 2012-09-12 MED ORDER — THROMBIN 5000 UNITS EX SOLR
OROMUCOSAL | Status: DC | PRN
  Administered 2012-09-12: 11:00:00 via TOPICAL

## 2012-09-12 MED ORDER — BUPIVACAINE HCL (PF) 0.5 % IJ SOLN
INTRAMUSCULAR | Status: DC | PRN
  Administered 2012-09-12: 3 mL

## 2012-09-12 MED ORDER — SODIUM CHLORIDE 0.9 % IR SOLN
Status: DC | PRN
  Administered 2012-09-12: 11:00:00

## 2012-09-12 MED ORDER — BIOTENE DRY MOUTH MT LIQD
15.0000 mL | Freq: Four times a day (QID) | OROMUCOSAL | Status: DC
  Administered 2012-09-12 – 2012-10-15 (×130): 15 mL via OROMUCOSAL

## 2012-09-12 MED ORDER — LIDOCAINE-EPINEPHRINE 1 %-1:100000 IJ SOLN
INTRAMUSCULAR | Status: DC | PRN
  Administered 2012-09-12: 3 mL

## 2012-09-12 MED ORDER — PNEUMOCOCCAL VAC POLYVALENT 25 MCG/0.5ML IJ INJ
0.5000 mL | INJECTION | INTRAMUSCULAR | Status: AC
  Administered 2012-09-13: 0.5 mL via INTRAMUSCULAR
  Filled 2012-09-12: qty 0.5

## 2012-09-12 MED ORDER — INFLUENZA VAC SPLIT QUAD 0.5 ML IM SUSP
0.5000 mL | INTRAMUSCULAR | Status: AC
  Administered 2012-09-13: 0.5 mL via INTRAMUSCULAR
  Filled 2012-09-12: qty 0.5

## 2012-09-12 MED ORDER — SODIUM CHLORIDE 0.9 % IV SOLN
3.0000 g | Freq: Once | INTRAVENOUS | Status: AC
  Administered 2012-09-12: 3 g via INTRAVENOUS
  Filled 2012-09-12: qty 3

## 2012-09-12 MED ORDER — HEMOSTATIC AGENTS (NO CHARGE) OPTIME
TOPICAL | Status: DC | PRN
  Administered 2012-09-12: 1 via TOPICAL

## 2012-09-12 MED ORDER — PROPOFOL 10 MG/ML IV EMUL: 5.0000 ug/kg/min | INTRAVENOUS | Status: DC

## 2012-09-12 MED ORDER — MIDAZOLAM HCL 5 MG/5ML IJ SOLN
INTRAMUSCULAR | Status: DC | PRN
  Administered 2012-09-12: 2 mg via INTRAVENOUS

## 2012-09-12 MED ORDER — OXYCODONE HCL 5 MG PO TABS: 5.0000 mg | ORAL_TABLET | Freq: Once | ORAL | Status: DC | PRN

## 2012-09-12 MED ORDER — THROMBIN 20000 UNITS EX SOLR
CUTANEOUS | Status: DC | PRN
  Administered 2012-09-12: 11:00:00 via TOPICAL

## 2012-09-12 MED ORDER — NIMODIPINE 60 MG/20ML PO SOLN
60.0000 mg | ORAL | Status: AC
  Administered 2012-09-12 – 2012-10-02 (×119): 60 mg
  Filled 2012-09-12 (×119): qty 20

## 2012-09-12 MED ORDER — SODIUM CHLORIDE 0.9 % IV SOLN
10.0000 mg | INTRAVENOUS | Status: DC | PRN
  Administered 2012-09-12: 40 ug/min via INTRAVENOUS

## 2012-09-12 MED ORDER — 0.9 % SODIUM CHLORIDE (POUR BTL) OPTIME
TOPICAL | Status: DC | PRN
  Administered 2012-09-12 (×3): 1000 mL

## 2012-09-12 SURGICAL SUPPLY — 85 items
APL SKNCLS STERI-STRIP NONHPOA (GAUZE/BANDAGES/DRESSINGS)
BANDAGE GAUZE ELAST BULKY 4 IN (GAUZE/BANDAGES/DRESSINGS) IMPLANT
BENZOIN TINCTURE PRP APPL 2/3 (GAUZE/BANDAGES/DRESSINGS) IMPLANT
BLADE SURG ROTATE 9660 (MISCELLANEOUS) ×2 IMPLANT
BLADE ULTRA TIP 2M (BLADE) ×2 IMPLANT
BRUSH SCRUB EZ 1% IODOPHOR (MISCELLANEOUS) ×1 IMPLANT
BUR ACORN 6.0 PRECISION (BURR) ×1 IMPLANT
BUR ADDG 1.1 (BURR) IMPLANT
BUR MATCHSTICK NEURO 3.0 LAGG (BURR) IMPLANT
BUR ROUTER D-58 CRANI (BURR) ×1 IMPLANT
CANISTER SUCTION 2500CC (MISCELLANEOUS) ×2 IMPLANT
CLIP TI MEDIUM 6 (CLIP) IMPLANT
CLOTH BEACON ORANGE TIMEOUT ST (SAFETY) ×2 IMPLANT
CONT SPEC 4OZ CLIKSEAL STRL BL (MISCELLANEOUS) ×3 IMPLANT
CORDS BIPOLAR (ELECTRODE) ×2 IMPLANT
DRAIN SNY WOU 7FLT (WOUND CARE) IMPLANT
DRAPE NEUROLOGICAL W/INCISE (DRAPES) ×2 IMPLANT
DRAPE SURG 17X23 STRL (DRAPES) IMPLANT
DRAPE WARM FLUID 44X44 (DRAPE) ×2 IMPLANT
DRESSING TELFA 8X3 (GAUZE/BANDAGES/DRESSINGS) ×2 IMPLANT
DRSG OPSITE POSTOP 4X6 (GAUZE/BANDAGES/DRESSINGS) ×1 IMPLANT
DRSG OPSITE POSTOP 4X8 (GAUZE/BANDAGES/DRESSINGS) ×1 IMPLANT
DURAMATRIX ONLAY 3X3 (Plate) ×2 IMPLANT
DURAPREP 26ML APPLICATOR (WOUND CARE) ×1 IMPLANT
DURAPREP 6ML APPLICATOR 50/CS (WOUND CARE) ×2 IMPLANT
ELECT CAUTERY BLADE 6.4 (BLADE) ×2 IMPLANT
ELECT REM PT RETURN 9FT ADLT (ELECTROSURGICAL) ×2
ELECTRODE REM PT RTRN 9FT ADLT (ELECTROSURGICAL) ×1 IMPLANT
EVACUATOR 1/8 PVC DRAIN (DRAIN) IMPLANT
EVACUATOR SILICONE 100CC (DRAIN) IMPLANT
GAUZE SPONGE 4X4 16PLY XRAY LF (GAUZE/BANDAGES/DRESSINGS) IMPLANT
GLOVE BIO SURGEON STRL SZ8.5 (GLOVE) ×1 IMPLANT
GLOVE BIOGEL PI IND STRL 7.5 (GLOVE) ×1 IMPLANT
GLOVE BIOGEL PI IND STRL 8 (GLOVE) IMPLANT
GLOVE BIOGEL PI IND STRL 8.5 (GLOVE) IMPLANT
GLOVE BIOGEL PI INDICATOR 7.5 (GLOVE) ×1
GLOVE BIOGEL PI INDICATOR 8 (GLOVE) ×1
GLOVE BIOGEL PI INDICATOR 8.5 (GLOVE) ×2
GLOVE ECLIPSE 7.0 STRL STRAW (GLOVE) ×2 IMPLANT
GLOVE ECLIPSE 7.5 STRL STRAW (GLOVE) ×1 IMPLANT
GLOVE EXAM NITRILE LRG STRL (GLOVE) IMPLANT
GLOVE EXAM NITRILE MD LF STRL (GLOVE) IMPLANT
GLOVE EXAM NITRILE XL STR (GLOVE) IMPLANT
GLOVE EXAM NITRILE XS STR PU (GLOVE) IMPLANT
GLOVE SURG SS PI 8.0 STRL IVOR (GLOVE) ×3 IMPLANT
GOWN BRE IMP SLV AUR LG STRL (GOWN DISPOSABLE) ×4 IMPLANT
GOWN BRE IMP SLV AUR XL STRL (GOWN DISPOSABLE) IMPLANT
GOWN STRL REIN 2XL LVL4 (GOWN DISPOSABLE) ×1 IMPLANT
HEMOSTAT SURGICEL 2X14 (HEMOSTASIS) ×1 IMPLANT
KIT BASIN OR (CUSTOM PROCEDURE TRAY) ×2 IMPLANT
KIT ROOM TURNOVER OR (KITS) ×2 IMPLANT
MARKER SKIN DUAL TIP RULER LAB (MISCELLANEOUS) ×1 IMPLANT
NDL HYPO 25X1 1.5 SAFETY (NEEDLE) ×1 IMPLANT
NEEDLE HYPO 25X1 1.5 SAFETY (NEEDLE) ×4 IMPLANT
NS IRRIG 1000ML POUR BTL (IV SOLUTION) ×2 IMPLANT
PACK CRANIOTOMY (CUSTOM PROCEDURE TRAY) ×2 IMPLANT
PATTIES SURGICAL .5 X.5 (GAUZE/BANDAGES/DRESSINGS) ×1 IMPLANT
PATTIES SURGICAL .5 X3 (DISPOSABLE) ×1 IMPLANT
PATTIES SURGICAL 1X1 (DISPOSABLE) ×1 IMPLANT
SPONGE GAUZE 4X4 12PLY (GAUZE/BANDAGES/DRESSINGS) ×2 IMPLANT
SPONGE NEURO XRAY DETECT 1X3 (DISPOSABLE) ×1 IMPLANT
SPONGE SURGIFOAM ABS GEL 100 (HEMOSTASIS) ×2 IMPLANT
STAPLER SKIN PROX WIDE 3.9 (STAPLE) ×1 IMPLANT
STAPLER VISISTAT 35W (STAPLE) ×3 IMPLANT
SUT ETHILON 3 0 FSL (SUTURE) ×1 IMPLANT
SUT ETHILON 3 0 PS 1 (SUTURE) IMPLANT
SUT NURALON 4 0 TR CR/8 (SUTURE) ×5 IMPLANT
SUT PL GUT 3 0 FS 1 (SUTURE) IMPLANT
SUT SILK 3 0 SH 30 (SUTURE) ×1 IMPLANT
SUT STEEL 0 (SUTURE)
SUT STEEL 0 18XMFL TIE 17 (SUTURE) IMPLANT
SUT VIC AB 0 CT1 18XCR BRD8 (SUTURE) IMPLANT
SUT VIC AB 0 CT1 8-18 (SUTURE) ×4
SUT VIC AB 2-0 CP2 18 (SUTURE) ×2 IMPLANT
SUT VIC AB 2-0 CT2 18 VCP726D (SUTURE) ×4 IMPLANT
SUT VIC AB 3-0 SH 8-18 (SUTURE) ×5 IMPLANT
SYR 20ML ECCENTRIC (SYRINGE) ×2 IMPLANT
SYR CONTROL 10ML LL (SYRINGE) ×2 IMPLANT
TAPE CLOTH SURG 4X10 WHT LF (GAUZE/BANDAGES/DRESSINGS) ×1 IMPLANT
TOWEL OR 17X24 6PK STRL BLUE (TOWEL DISPOSABLE) ×2 IMPLANT
TOWEL OR 17X26 10 PK STRL BLUE (TOWEL DISPOSABLE) ×2 IMPLANT
TRAY FOLEY CATH 14FRSI W/METER (CATHETERS) ×1 IMPLANT
TUBE CONNECTING 12X1/4 (SUCTIONS) ×2 IMPLANT
UNDERPAD 30X30 INCONTINENT (UNDERPADS AND DIAPERS) ×1 IMPLANT
WATER STERILE IRR 1000ML POUR (IV SOLUTION) ×2 IMPLANT

## 2012-09-12 NOTE — OR Nursing (Signed)
Dr. Chestine Spore, radiologist, called back to verify placement of the central line. Communicated the information to Dr. Sampson Goon.

## 2012-09-12 NOTE — Progress Notes (Signed)
Called by RN this am for change in neurologic exam. Pt early this am was noted to have anisocoric pupils, with right 6mm and NR, left also ~59mm and NR. CTH was done.  EXAM: Temp:  [96.3 F (35.7 C)-98.4 F (36.9 C)] 98.4 F (36.9 C) (09/12 0400) Pulse Rate:  [60-92] 85 (09/12 0816) Resp:  [14-31] 17 (09/12 0816) BP: (112-143)/(51-92) 127/68 mmHg (09/12 0816) SpO2:  [95 %-100 %] 100 % (09/12 0816) Arterial Line BP: (111-162)/(56-89) 111/66 mmHg (09/12 0700) FiO2 (%):  [40 %] 40 % (09/12 0816) Weight:  [53.6 kg (118 lb 2.7 oz)] 53.6 kg (118 lb 2.7 oz) (09/11 1337)  Off sedation: Pupils 6mm NR OU (+) corneal, cough, gag Localizes with RUE, withdraws RLE, LUE/LLE  LABS: Lab Results  Component Value Date   CREATININE 0.59 09/12/2012   BUN 10 09/12/2012   NA 138 09/12/2012   K 4.0 09/12/2012   CL 105 09/12/2012   CO2 22 09/12/2012   Lab Results  Component Value Date   WBC 15.7* 09/11/2012   HGB 8.2* 09/11/2012   HCT 24.0* 09/11/2012   MCV 70.0* 09/11/2012   PLT 100* 09/11/2012    IMAGING: CTH demonstrates area of right perisylvian edema, possible infarction with effacement of right lat vent and subfalcine herniation with effacement of basal cisterns. Left lat vent may be trapped  IMPRESSION: - 27 y.o. female presented as H&H 3/4 s/p right pterional craniotomy for clipping of RMCA aneurysm with decline in exam sec to perioperative stroke/edema.  PLAN: - emergent craniectomy for decompression - 23.4% Na bolus now, 3% hypertonic saline gtt @75cc /hr  The change in neurologic exam as well as the plan above was discussed with the pts father who understood the situation. He provided consent for the above procedure.

## 2012-09-12 NOTE — Progress Notes (Signed)
UR completed 

## 2012-09-12 NOTE — Anesthesia Postprocedure Evaluation (Signed)
  Anesthesia Post-op Note  Patient: Sally Nelson  Procedure(s) Performed: Procedure(s): Right decompressive craniectomy and placement of boneflap in right lower quadrant (Right)  Patient Location: ICU  Anesthesia Type:General  Level of Consciousness: sedated and unresponsive  Airway and Oxygen Therapy: Patient remains intubated per anesthesia plan and Patient placed on Ventilator (see vital sign flow sheet for setting)  Post-op Pain: none  Post-op Assessment: Post-op Vital signs reviewed, Patient's Cardiovascular Status Stable and Respiratory Function Stable  Post-op Vital Signs: Reviewed and stable  Complications: No apparent anesthesia complications

## 2012-09-12 NOTE — Progress Notes (Signed)
eLink Physician-Brief Progress Note Patient Name: Sally Nelson DOB: 06-30-1985 MRN: [id number]  Date of Service  09/12/2012   HPI/Events of Note     eICU Interventions  Unasyn empiric for aspn pna   Intervention Category Intermediate Interventions: Diagnostic test evaluation  Juanjose Mojica V. 09/12/2012, 1:48 AM

## 2012-09-12 NOTE — Transfer of Care (Signed)
Immediate Anesthesia Transfer of Care Note  Patient: Sally Nelson  Procedure(s) Performed: Procedure(s): Right decompressive craniectomy and placement of boneflap in right lower quadrant (Right)  Patient Location: ICU  Anesthesia Type:General  Level of Consciousness: sedated, unresponsive and Patient remains intubated per anesthesia plan  Airway & Oxygen Therapy: Patient remains intubated per anesthesia plan and Patient placed on Ventilator (see vital sign flow sheet for setting)  Post-op Assessment: Post -op Vital signs reviewed and stable and Report given to [place], RN  Post vital signs: Reviewed and stable  Complications: No apparent anesthesia complications

## 2012-09-12 NOTE — Progress Notes (Signed)
IVC not draining. No fluid noted in tubing. MD notified. No new orders given. Will continue to monitor closely. [place], [place] M

## 2012-09-12 NOTE — Progress Notes (Signed)
PULMONARY  / CRITICAL CARE MEDICINE  Name: Sally Nelson MRN: [id number] DOB: [birth year]    ADMISSION DATE:  09/11/2012 CONSULTATION DATE:  09/11/2012  REFERRING MD :  Conchita Paris PRIMARY SERVICE: PCCM  CHIEF COMPLAINT:  Post op vent management  BRIEF PATIENT DESCRIPTION: 27 y/o female underwent a craniotomy, clipping R MCA aneurysm for a SAH on 9/12 and PCCM was consulted for post op vent management.  SIGNIFICANT EVENTS / STUDIES:  9/12 craniotomy, clipping R MCA aneurysm for a SAH Head CT 9/12 8:45 > interval development R SDH, effacement basilar cisterns, evolving R MCA CVA, midline shift 14mm  LINES / TUBES: 9/12 ETT >>  CULTURES: 9/12 respiratory culture >>  ANTIBIOTICS: 9/12 unasyn >>  HISTORY OF PRESENT ILLNESS:  27 y/o female underwent a craniotomy, clipping R MCA aneurysm for a SAH on 9/12 and PCCM was consulted for post op vent management.  History is obtained from chart review as the patient was intubated.  Apparently she complained on 9/10 of a headache and went to the [facility] ED on 9/11 where she was found to have an [facility] and was transferred to [facility].    SUBJECTIVE:  Progressive cerebral edema, blown R pupil around 3:00 Head CT 8:45 > interval development R SDH, effacement basilar cisterns, evolving R MCA CVA, midline shift 14mm ventric non-functioning   VITAL SIGNS: Temp:  [96.3 F (35.7 C)-98.4 F (36.9 C)] 98.4 F (36.9 C) (09/12 0400) Pulse Rate:  [60-92] 85 (09/12 0816) Resp:  [14-31] 17 (09/12 0816) BP: (112-143)/(51-92) 127/68 mmHg (09/12 0816) SpO2:  [95 %-100 %] 100 % (09/12 0816) Arterial Line BP: (111-162)/(56-89) 111/66 mmHg (09/12 0700) FiO2 (%):  [40 %] 40 % (09/12 0816) Weight:  [53.6 kg (118 lb 2.7 oz)] 53.6 kg (118 lb 2.7 oz) (09/11 1337) HEMODYNAMICS:   VENTILATOR SETTINGS: Vent Mode:  [-] PRVC FiO2 (%):  [40 %] 40 % Set Rate:  [14 bmp] 14 bmp Vt Set:  [500 mL] 500 mL PEEP:  [5 cmH20] 5 cmH20 INTAKE / OUTPUT: Intake/Output     09/11  0701 - 09/12 0700 09/12 0701 - 09/13 0700   I.V. (mL/kg) 4765.2 (88.9)    Blood 350    IV Piggyback 310    Total Intake(mL/kg) 5425.2 (101.2)    Urine (mL/kg/hr) 3330 100 (0.8)   Blood 350    Total Output 3680 100   Net +1745.2 -100          PHYSICAL EXAMINATION: Gen: sedated on vent HEENT: scalp dressing in place, drain in place, ETT in place PULM: few rhonchi, good air entry CV: RRR, loud diastolic/systolic murmur, no JVD AB: BS+, soft, nontender, no hsm Ext: warm, no edema, no clubbing, no cyanosis Derm: no rash or skin breakdown Neuro: comatose, R pupil blown, L 4mm and non-reactive   LABS:  CBC Recent Labs     09/11/12  1328  09/11/12  2230  WBC  15.7*   --   HGB  9.9*  8.2*  HCT  27.8*  24.0*  PLT  100*   --    Coag's Recent Labs     09/11/12  1328  APTT  34  INR  1.16   BMET Recent Labs     09/11/12  1328  09/11/12  2230  09/12/12  0500  NA  127*  134*  138  K  4.0  4.6  4.0  CL  90*   --   105  CO2  26   --  22  BUN  7   --   10  CREATININE  0.50   --   0.59  GLUCOSE  163*  158*  187*   Electrolytes Recent Labs     09/11/12  1328  09/12/12  0500  CALCIUM  9.1  8.7   Sepsis Markers No results found for this basename: LACTICACIDVEN, PROCALCITON, O2SATVEN,  in the last 72 hours ABG Recent Labs     09/12/12  0150  PHART  7.423  PCO2ART  35.9  PO2ART  184.0*   Liver Enzymes Recent Labs     09/11/12  1328  AST  22  ALT  10  ALKPHOS  99  BILITOT  0.9  ALBUMIN  3.1*   Cardiac Enzymes No results found for this basename: TROPONINI, PROBNP,  in the last 72 hours Glucose No results found for this basename: GLUCAP,  in the last 72 hours   CXR:  9/12 ETT in place, RLL infiltrate  ASSESSMENT / PLAN:  NEUROLOGIC A:  SAH, s/p R MCA clipping c/b R MCA territory CVA, edema and effacement P:   -to OR 9/12 for craniectomy -concentrated saline given 9/12, 3% saline to start 9/12 after OR  PULMONARY A: Post op respiratory failure  > good vent mechanics/oxygenation CAP vs aspiration pneumonia (favor aspiration) Asthma> not in exacerbation P:   -see ID section -full vent support, limiting factor will be mental status  CARDIOVASCULAR A: Loud heart murmur > 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 HD stable P:  -tele -clarify history with father/obtain records  RENAL A:  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  CRITICAL CARE: The patient is critically ill with multiple organ systems failure and requires high complexity decision making for assessment and support, frequent evaluation and titration of therapies, application of advanced monitoring technologies and extensive interpretation of multiple databases. Critical Care Time devoted to patient care services described in this note is 45 minutes.   Levy Pupa, MD, PhD 09/12/2012, 10:00 AM [facility] [phone] or if no answer [phone]

## 2012-09-12 NOTE — Progress Notes (Signed)
Patient's pupils have become unequal. Right pupil is a 4 and nonreactive. Left pupil is a 2 and sluggish. Flicker in all extremities to pain. Cough and gag present. MD notified. No new orders given. Will continue to monitor closely. Sally Nelson, [place] M

## 2012-09-12 NOTE — Addendum Note (Signed)
Addendum created 09/12/12 0100 by Nicholos Johns, CRNA   Modules edited: Anesthesia Events

## 2012-09-12 NOTE — Progress Notes (Signed)
27yo female admitted for right MCA aneurysm, now s/p clipping, to begin IV ABX for possible aspiration PNA d/t consolidation within right lung base on CXR.  Will start Unasyn 3g IV Q8H for CrCl ~80 ml/min and monitor.  Vernard Gambles, PharmD, BCPS 09/12/2012 1:54 AM

## 2012-09-12 NOTE — Progress Notes (Addendum)
SLP Cancellation Note  Patient Details Name: Sally Nelson MRN: [id number] DOB: June 18, 1985   Cancelled treatment:       Reason Eval/Treat Not Completed: Patient not medically ready. Still on ventilator, medical complications. Will follow chart for changes.    Adil Tugwell, Riley Nearing 09/12/2012, 8:16 AM

## 2012-09-12 NOTE — Preoperative (Signed)
Beta Blockers   Reason not to administer Beta Blockers:Not Applicable 

## 2012-09-12 NOTE — Progress Notes (Signed)
The IVC drain has not had output for last 2 hours. Notified MD who is currently in surgery and will come assess the drain once out of surgery. No new orders at this time.

## 2012-09-12 NOTE — Progress Notes (Signed)
INITIAL NUTRITION ASSESSMENT  DOCUMENTATION CODES Per approved criteria  -Not Applicable   INTERVENTION:  If unable to extubate patient today, recommend initiate TF via OGT with Vital AF 1.2 at 20 ml/h, increase by 10 ml every 4 hours to goal rate of 50 ml/h to provide 1440 kcals, 90 gm protein, 973 ml free water daily.  NUTRITION DIAGNOSIS: Inadequate oral intake related to inability to eat as evidenced by NPO status.   Goal: Intake to meet >90% of estimated nutrition needs.  Monitor:  TF initiation/tolerance/adequacy, weight trend, labs, vent status.  Reason for Assessment: VDRF  27 y.o. female  Admitting Dx: SAH (subarachnoid hemorrhage)  ASSESSMENT: Patient S/P craniotomy, clipping R MCA aneurysm for a SAH last night. Head CT this AM showed development of R SDH, evolving R MCA CVA, and increasing midline shift. Patient is currently in the OR for R decompressing craniectomy. Discussed patient in ICU rounds today.  Patient is currently intubated on ventilator support.  MV: 7.3 L/min Temp:Temp (24hrs), Avg:98.1 F (36.7 C), Min:96.3 F (35.7 C), Max:99.4 F (37.4 C)  Propofol: off  Height: Ht Readings from Last 1 Encounters:  09/11/12 5\' 1"  (1.549 m)    Weight: Wt Readings from Last 1 Encounters:  09/11/12 118 lb 2.7 oz (53.6 kg)    Ideal Body Weight: 47.7 kg  % Ideal Body Weight: 112%  Wt Readings from Last 10 Encounters:  09/11/12 118 lb 2.7 oz (53.6 kg)  09/11/12 118 lb 2.7 oz (53.6 kg)  09/11/12 118 lb 2.7 oz (53.6 kg)  09/11/12 118 lb 2.7 oz (53.6 kg)    Usual Body Weight: unknown  BMI:  Body mass index is 22.34 kg/(m^2).  Estimated Nutritional Needs: Kcal: 1450 Protein: 75-85 gm Fluid: 1.5-1.6 L  Skin: surgical incisions  Diet Order: NPO  EDUCATION NEEDS: -Education not appropriate at this time   Intake/Output Summary (Last 24 hours) at 09/12/12 1128 Last data filed at 09/12/12 1100  Gross per 24 hour  Intake 5625.17 ml  Output    3990 ml  Net 1635.17 ml    Last BM: none documented   Labs:   Recent Labs Lab 09/11/12 1328 09/11/12 2230 09/12/12 0500  NA 127* 134* 138  K 4.0 4.6 4.0  CL 90*  --  105  CO2 26  --  22  BUN 7  --  10  CREATININE 0.50  --  0.59  CALCIUM 9.1  --  8.7  GLUCOSE 163* 158* 187*    CBG (last 3)  No results found for this basename: GLUCAP,  in the last 72 hours  Scheduled Meds: . [MAR HOLD] ampicillin-sulbactam (UNASYN) IV  3 g Intravenous Q6H  . [facility] HOLD] antiseptic oral rinse  15 mL Mouth Rinse QID  . [facility] HOLD] chlorhexidine  15 mL Mouth Rinse BID  . [facility] HOLD] indocyanine green  25 mg Intravenous Once  . [MAR HOLD] levETIRAcetam  500 mg Intravenous Q12H  . [facility] HOLD] niMODipine  60 mg Oral Q4H   Or  . [MAR HOLD] NiMODipine  60 mg Per Tube Q4H  . [MAR HOLD] pantoprazole (PROTONIX) IV  40 mg Intravenous QHS  . [facility] HOLD] papaverine  60 mg Intravenous Once  . [facility] HOLD] senna-docusate  1 tablet Oral BID    Continuous Infusions: . sodium chloride 100 mL/hr at 09/12/12 0900  . [facility] HOLD] propofol 20 mcg/kg/min (09/12/12 0930)  . sodium chloride (hypertonic)      Past Medical History  Diagnosis Date  . Heart  murmur   . Asthma     Past Surgical History  Procedure Laterality Date  . No past surgeries      Joaquin Courts, RD, LDN, CNSC Pager [phone] After Hours Pager [phone]

## 2012-09-12 NOTE — Consult Note (Signed)
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  History is obtained from chart review as the patient was intubated.  Apparently she complained on 9/10 of a headache and went to the [facility] ED on 9/11 where she was found to have an [facility] and was transferred to [facility].     PAST MEDICAL HISTORY :  Past Medical History  Diagnosis Date  . Heart murmur   . Asthma    Past Surgical History  Procedure Laterality Date  . No past surgeries     Prior to Admission medications   Not on File   Allergies  Allergen Reactions  . Contrast Media [Iodinated Diagnostic Agents] Hives    FAMILY HISTORY:SOCIAL HISTORY:REVIEW OF SYSTEMS:  Cannot obtain due to intubation   SUBJECTIVE:   VITAL SIGNS: Temp:  [96.3 F (35.7 C)-98.1 F (36.7 C)] 98.1 F (36.7 C) (09/12 0100) Pulse Rate:  [69-92] 74 (09/12 0100) Resp:  [14-31] 14 (09/12 0100) BP: (118-143)/(77-92) 126/92 mmHg (09/12 0100) SpO2:  [95 %-100 %] 100 % (09/12 0100) Arterial Line BP: (152)/(88) 152/88 mmHg (09/12 0100) FiO2 (%):  [40 %] 40 % (09/12 0100) Weight:  [53.6 kg (118 lb 2.7 oz)] 53.6 kg (118 lb 2.7 oz) (09/11 1337) HEMODYNAMICS:   VENTILATOR SETTINGS: Vent Mode:  [-] PRVC FiO2 (%):  [40 %] 40 % Set Rate:  [14 bmp] 14 bmp Vt Set:   [500 mL] 500 mL PEEP:  [5 cmH20] 5 cmH20 INTAKE / OUTPUT: Intake/Output     09/11 0701 - 09/12 0700   I.V. (mL/kg) 4000 (74.6)   Blood 350   IV Piggyback 105   Total Intake(mL/kg) 4455 (83.1)   Urine (mL/kg/hr) 3050   Blood 350   Total Output 3400   Net +1055         PHYSICAL EXAMINATION: Gen: sedated on vent HEENT: scalp dressing in place, drain in place, ETT in place PULM: few rhonchi, good air entry CV: RRR, loud diastolic/systolic murmur, no JVD AB: BS+, soft, nontender, no hsm Ext: warm, no edema, no clubbing, no cyanosis Derm: no rash or skin breakdown Neuro: sedated, paralyzed on vent   LABS:  CBC Recent Labs     09/11/12  1328  WBC  15.7*  HGB  9.9*  HCT  27.8*  PLT  100*   Coag's Recent Labs     09/11/12  1328  APTT  34  INR  1.16   BMET Recent Labs     09/11/12  1328  NA  127*  K  4.0  CL  90*  CO2  26  BUN  7  CREATININE  0.50  GLUCOSE  163*   Electrolytes Recent Labs     09/11/12  1328  CALCIUM  9.1   Sepsis Markers  No results found for this basename: LACTICACIDVEN, PROCALCITON, O2SATVEN,  in the last 72 hours ABG No results found for this basename: PHART, PCO2ART, PO2ART,  in the last 72 hours Liver Enzymes Recent Labs     09/11/12  1328  AST  22  ALT  10  ALKPHOS  99  BILITOT  0.9  ALBUMIN  3.1*   Cardiac Enzymes No results found for this basename: TROPONINI, PROBNP,  in the last 72 hours Glucose No results found for this basename: GLUCAP,  in the last 72 hours   CXR: ETT in place, RLL consolidation  ASSESSMENT / PLAN:  NEUROLOGIC A:  SAH, s/p R MCA clipping P:   -per NSGY -fentanyl/propfol for vent synchrony  PULMONARY A: Post op respiratory failure > good vent mechanics/oxygenation CAP vs aspiration pneumonia (favor aspiration) Asthma> not in exacerbation P:   -see ID -full vent support -SBT/WUA when OK by NSGY  CARDIOVASCULAR A: Loud heart murmur > 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 HD stable P:   -tele -clarify history with father/obtain records  RENAL A:  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 CRITICAL CARE: The patient is critically ill with multiple organ systems failure and requires high complexity decision making for assessment and support, frequent evaluation and titration of therapies, application of advanced monitoring technologies and extensive interpretation of multiple databases. Critical Care Time devoted to patient care services described in this note is 45 minutes.   Fonnie Jarvis Pulmonary and Critical Care Medicine [facility] Pager: [phone]  09/12/2012, 1:28 AM

## 2012-09-12 NOTE — Anesthesia Postprocedure Evaluation (Signed)
  Anesthesia Post-op Note  Patient: Sally Nelson  Procedure(s) Performed: Procedure(s): CRANIOTOMY FOR CLIPPING OF  ANEURYSM  (Right)  Patient Location: NICU  Anesthesia Type:General  Level of Consciousness: sedated and unresponsive  Airway and Oxygen Therapy: Patient remains intubated per anesthesia plan and Patient placed on Ventilator (see vital sign flow sheet for setting)  Post-op Pain: none  Post-op Assessment: Post-op Vital signs reviewed  Post-op Vital Signs: Reviewed  Complications: No apparent anesthesia complications

## 2012-09-12 NOTE — OR Nursing (Signed)
Dr. Conchita Paris removed femoral line on right side with Mynx vascular closure device. Lot # [id number]

## 2012-09-12 NOTE — Anesthesia Preprocedure Evaluation (Addendum)
Anesthesia Evaluation  Patient identified by MRN, date of birth, ID band Patient unresponsive  General Assessment Comment:Sedated on vent  Reviewed: Allergy & Precautions, H&P , NPO status , Patient's Chart, lab work & pertinent test results  Airway       Dental no notable dental hx.    Pulmonary asthma ,  intubated   Pulmonary exam normal       Cardiovascular negative cardio ROS      Neuro/Psych S/p craniotomy for aneurysm negative psych ROS   GI/Hepatic negative GI ROS, Neg liver ROS,   Endo/Other  negative endocrine ROS  Renal/GU negative Renal ROS  negative genitourinary   Musculoskeletal   Abdominal   Peds  Hematology negative hematology ROS (+)   Anesthesia Other Findings   Reproductive/Obstetrics negative OB ROS                           Anesthesia Physical Anesthesia Plan  ASA: IV and emergent  Anesthesia Plan: General   Post-op Pain Management:    Induction: Intravenous  Airway Management Planned: Oral ETT  Additional Equipment: CVP  Intra-op Plan:   Post-operative Plan: Post-operative intubation/ventilation  Informed Consent: I have reviewed the patients History and Physical, chart, labs and discussed the procedure including the risks, benefits and alternatives for the proposed anesthesia with the patient or authorized representative who has indicated his/her understanding and acceptance.   Dental advisory given  Plan Discussed with: CRNA  Anesthesia Plan Comments:        Anesthesia Quick Evaluation

## 2012-09-12 NOTE — Transfer of Care (Signed)
Immediate Anesthesia Transfer of Care Note  Patient: Sally Nelson  Procedure(s) Performed: Procedure(s): CRANIOTOMY FOR CLIPPING OF  ANEURYSM  (Right)  Patient Location: PACU and ICU  Anesthesia Type:General  Level of Consciousness: Patient remains intubated per anesthesia plan  Airway & Oxygen Therapy: Patient remains intubated per anesthesia plan and Patient placed on Ventilator (see vital sign flow sheet for setting)  Post-op Assessment: Report given to PACU RN and Post -op Vital signs reviewed and stable  Post vital signs: Reviewed and stable  Complications: No apparent anesthesia complications

## 2012-09-12 NOTE — Op Note (Signed)
PREOP DIAGNOSIS: Intracranial Hypertension  POSTOP DIAGNOSIS: Same  PROCEDURE: 1. Right decompressive hemicraniectomy 2. Implantation of bone flap in abdomen  SURGEON: Dr. Lisbeth Renshaw, MD  ASSISTANT: Dr. Maeola Harman, MD  ANESTHESIA: General Endotracheal  EBL: 150cc  SPECIMENS: None  DRAINS: None  COMPLICATIONS: None immediate  CONDITION: Hemodynamically stable to NICU  HISTORY: Sally Nelson is a 27 y.o. 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 Overnight, neurologic change was noted and CT scan was obtained demonstrating a right hemispheric edema and possible infarction with subfalcine and transtentorial herniation. The patient was therefore emergently scheduled for decompressive hemicraniectomy.  PROCEDURE IN DETAIL: After informed consent was obtained and witnessed from the patients father, the patient was brought to the operating room. After induction of general anesthesia, the patient was positioned on the operative table in the supine position with a right shoulder roll. All pressure points were meticulously padded. Skin incision was then marked out and prepped and draped in the usual sterile fashion on both the right scalp and the right lower quadrant.  After time-out was conducted, the previous incision was opened with scissors and the one-piece myocutaneous flap was retracted anteriorly. This incision was then teed posteriorly and Bovie electrocautery was used to dissect down through the periosteum and self-retaining retractor was placed. The previous bone flap was then removed and a high-speed drill was used to extend the craniectomy over the posterior temporal lobe and parietal lobe. The previous dural incision was then opened and the dural incision was extended posteriorly. Several relaxing incisions were made inferiorly and superiorly. The underlying brain was noted to be edematous but pulsatile. Subtemporal decompression  was then completed using a rongeur. Good hemostasis was then obtained using a combination of bipolar electrocautery and passive hemostatics.  Incision was then made on the abdomen in the right lower quadrant. Dissection was carried down to the fascial layer and a subcutaneous pocket was created. The removed bone flap pieces were then placed in the subcutaneous space pocket. The wound is irrigated with copious amounts of him back saline, and the wound closed in layers using interrupted 30 and 0 Vicryl stitches. Skin was then closed using standard surgical skin staples. In a similar fashion, the cranial incision was closed in layers using interrupted 01 3-0 Vicryl stitches and the skin with staples. At the end of the case all sponge needle instrument and cottonoid counts were correct.  The previously placed right femoral sheath was then prepped, and the sheath was removed using a 5 French Mynx closure device with good hemostasis.  The patient was then taken to the neurointensive care unit in stable hemodynamic condition.

## 2012-09-12 NOTE — Anesthesia Preprocedure Evaluation (Signed)
Anesthesia Evaluation    Airway       Dental   Pulmonary asthma ,          Cardiovascular     Neuro/Psych    GI/Hepatic   Endo/Other    Renal/GU      Musculoskeletal   Abdominal   Peds  Hematology   Anesthesia Other Findings Pt intubated when I assumed care.Cerebral aneurysm.  Reproductive/Obstetrics                           Anesthesia Physical Anesthesia Plan  ASA: IV and emergent  Anesthesia Plan: General   Post-op Pain Management:    Induction: Intravenous  Airway Management Planned: Oral ETT  Additional Equipment:   Intra-op Plan:   Post-operative Plan: Post-operative intubation/ventilation  Informed Consent:   History available from chart only  Plan Discussed with: CRNA, Anesthesiologist and Surgeon  Anesthesia Plan Comments:         Anesthesia Quick Evaluation

## 2012-09-12 NOTE — Progress Notes (Signed)
MD assessed the IVC drain and since pt neuro status has improved he doesn't feel the need to make any changes or new CT scan at this time. If neuro status declines then notify MD

## 2012-09-12 NOTE — Procedures (Signed)
PREOP DIAGNOSIS: Right MCA Aneurysm  POSTOP DIAGNOSIS: Same  PROCEDURE: 1. Right pterional craniotomy for clipping of complex right MCA aneurysm 2. Use of operating microscope for microdissection 3. Use of intraoperative ICG videoangiography   SURGEON: Dr. Lisbeth Renshaw, MD  ASSISTANT: Dr. Colon Branch, MD  ANESTHESIA: General Endotracheal  EBL: 350cc  SPECIMENS: None  DRAINS: right frontal External ventricular drain  COMPLICATIONS: None immediate  CONDITION: Hemodynamically stable to ICU  HISTORY: Sally Nelson is a 27 y.o. female who presented to the emergency department as a transfer after waking up at home obtunded. She was brought to the hospital by family and transferred to [facility] where she was found to have subarachnoid hemorrhage. Diagnostics to cholangiogram was therefore done which demonstrated a right middle cerebral artery aneurysm not amenable to coil embolization. She was therefore a candidate for surgical clipping.  PROCEDURE IN DETAIL: After informed consent was obtained and witnessed, the patient was brought to the operating room. After induction of general anesthesia, the patient was positioned on the operative table in the supine position. All pressure points were meticulously padded. Skin incision was then marked out and prepped and draped in the usual sterile fashion.  After time-out was conducted, skin incision was made sharply and Bovie electrocautery was used to dissect the subcutaneous tissue and galea. Raney clips were then used to secure hemostasis on the skin edges. The superficial temporal artery was dissected free and retracted with the skin flap. Bovie electrocautery was used to dissect through the pericranium as well as the temporalis fascia and muscle. A combination of electrocautery and periosteal elevators was used to elevate a standard one-piece myocutaneous frontotemporal flap. Fishhooks were then used for retraction. Bur holes  were then created in the pterion, above the root of the zygoma, and the superior temporal line. These are then connected with the craniotome and a standard pterional craniotomy flap was elevated. Hemostasis was achieved on the bone edges, and a high-speed drill was used to drill down the lesser wing of the sphenoid.  The dura was then opened in cruciate fashion and good hemostasis was achieved on the dural edges. At this point the microscope was draped and brought into the field and the remainder of the case was done under the microscope using microdissection.  The superficial sylvian fissure was opened sharply and a significant amount of sylvian clot was identified with difficulty in identifying the sylvian fissure and the MCA vessels do to significant adhesions. We were able to identify the distal M2 segments and these are trace to identify the proximal M2 segment. As we were dissecting around the M2 to identify the aneurysm, we encountered brisk arterial bleeding which we believe to be aneurysmal in nature. A straight temporary clip was therefore selected and the M2 branch was temporarily occluded. Anesthesia service was asked to administer a bolus of propofol to reduce the CMRO2. We then quickly continued with the dissection of the middle cerebral artery and the aneurysm neck was identified. Bipolar electrocautery was used to coagulate the pia on the temporal surface and suction was used to dissect the aneurysm away from the temporal lobe. At this point we identify the source of the prior bleeding to be a small arterial segment from the frontal surface and this was coagulated and bleeding was controlled. Temporary clip was then removed.  We then proceeded with dissection of the neck and dome of the aneurysm. Intraoperative ICG was used to identify flow in the parent and distal middle cerebral  artery as well as the aneurysm, without significant flow seen in a branch which was inferior and lateral to the  aneurysm.  This correlated with the angiographic findings. During dissection of the aneurysm, a small hole was created in the aneurysm dome and therefore temporary clip was again placed on the parent vessel. A curved standard Aesculap titanium clip was then selected, and placed across the neck of the aneurysm. The temporary clip was then removed. ICG video angiogram was again conducted which demonstrated good filling of the parent middle she will artery as well as the distal M2 segment. There did appear to be some filling of the aneurysm on the video angiogram and therefore TB syringe was used to puncture the dome of the aneurysm. No active bleeding was identified and therefore the aneurysm appeared completely secured.  At this point the wound is irrigated with copious amounts of normal saline irrigation. Good hemostasis was confirmed on the brain surface. The dura was then closed using a combination of interrupted and continuous 4-0 Nurolon stitches. A small piece of DuraGen was then placed over the dural surface suture line. Muscle was then closed using interrupted 0 Vicryl stitches, and the galea was closed using interrupted 3-0 Vicryl sutures. The skin was closed using standard surgical skin staples. Sterile dressing was then applied after the [place] head holder was removed. The patient was then transferred to the stretcher and taken to the ICU in stable hemodynamic condition.  At the end of the case all sponge, needle, and instrument counts were correct.

## 2012-09-12 NOTE — Progress Notes (Signed)
**Note De-Identified Sally Nelson Obfuscation** Rt note: sputum collected and sent to lab;  Transport for STAT head CT; tolerated well

## 2012-09-12 NOTE — Progress Notes (Signed)
During nurse change of shift while assessing the patient together noticed that now both pupils are unequal Rt pupil 7mm and the Left pupil 6 mm NR. IVC drain is still not draining. Notifed MD oncall who currently is in surgery and was told to let primary physician address with rounding. No new orders at this time.

## 2012-09-13 ENCOUNTER — Inpatient Hospital Stay (HOSPITAL_COMMUNITY): Payer: Medicaid Other

## 2012-09-13 DIAGNOSIS — J690 Pneumonitis due to inhalation of food and vomit: Secondary | ICD-10-CM

## 2012-09-13 DIAGNOSIS — I609 Nontraumatic subarachnoid hemorrhage, unspecified: Principal | ICD-10-CM

## 2012-09-13 DIAGNOSIS — J95821 Acute postprocedural respiratory failure: Secondary | ICD-10-CM

## 2012-09-13 LAB — BASIC METABOLIC PANEL
BUN: 8 mg/dL (ref 6–23)
CO2: 23 mEq/L (ref 19–32)
Chloride: 114 mEq/L — ABNORMAL HIGH (ref 96–112)
GFR calc non Af Amer: >90 mL/min (ref >90)
Glucose, Bld: 138 mg/dL — ABNORMAL HIGH (ref 70–99)
Potassium: 3.4 mEq/L — ABNORMAL LOW (ref 3.5–5.1)

## 2012-09-13 LAB — SODIUM: Sodium: 146 mEq/L — ABNORMAL HIGH (ref 135–145)

## 2012-09-13 LAB — CBC
HCT: 24 % — ABNORMAL LOW (ref 36.0–46.0)
Hemoglobin: 8.3 g/dL — ABNORMAL LOW (ref 12.0–15.0)
MCV: 71.9 fL — ABNORMAL LOW (ref 78.0–100.0)
RBC: 3.34 MIL/uL — ABNORMAL LOW (ref 3.87–5.11)
WBC: 16.1 10*3/uL — ABNORMAL HIGH (ref 4.0–10.5)

## 2012-09-13 LAB — GLUCOSE, CAPILLARY
Glucose-Capillary: 120 mg/dL — ABNORMAL HIGH (ref 70–99)
Glucose-Capillary: 140 mg/dL — ABNORMAL HIGH (ref 70–99)

## 2012-09-13 MED ORDER — VITAL AF 1.2 CAL PO LIQD
1000.0000 mL | ORAL | Status: DC
  Filled 2012-09-13 (×2): qty 1000

## 2012-09-13 MED ORDER — POTASSIUM PHOSPHATE DIBASIC 3 MMOLE/ML IV SOLN
40.0000 meq | Freq: Once | INTRAVENOUS | Status: AC
  Administered 2012-09-13: 40 meq via INTRAVENOUS
  Filled 2012-09-13: qty 9.09

## 2012-09-13 MED ORDER — VITAL AF 1.2 CAL PO LIQD
1000.0000 mL | ORAL | Status: DC
  Administered 2012-09-13 – 2012-09-22 (×10): 1000 mL
  Filled 2012-09-13 (×15): qty 1000

## 2012-09-13 MED ORDER — ARTIFICIAL TEARS OP OINT
TOPICAL_OINTMENT | OPHTHALMIC | Status: DC | PRN
  Filled 2012-09-13: qty 3.5

## 2012-09-13 NOTE — Progress Notes (Signed)
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  Follows some commands on right per RN  VITAL SIGNS: Temp:  [97.2 F (36.2 C)-98.9 F (37.2 C)] 97.9 F (36.6 C) (09/13 1000) Pulse Rate:  [62-93] 64 (09/13 1000) Resp:  [14-16] 14 (09/13 1000) BP: (109-124)/(60-79) 115/63 mmHg (09/13 1000) SpO2:  [99 %-100 %] 100 % (09/13 1000) Arterial Line BP: (132-152)/(56-74) 147/63 mmHg (09/13 1000) FiO2 (%):  [40 %] 40 % (09/13 0817) HEMODYNAMICS:   VENTILATOR SETTINGS: Vent Mode:  [-] PRVC FiO2 (%):  [40 %] 40 % Set Rate:  [14 bmp] 14 bmp Vt Set:  [500 mL] 500 mL PEEP:  [5 cmH20] 5 cmH20 Plateau Pressure:  [23 cmH20-25 cmH20] 25 cmH20 INTAKE / OUTPUT: Intake/Output     09/12 0701 - 09/13 0700 09/13 0701 - 09/14 0700   I.V. (mL/kg) 2475 (46.2) 225 (4.2)   Blood     NG/GT  150   IV Piggyback 610    Total Intake(mL/kg) 3085 (57.6) 375 (7)   Urine (mL/kg/hr) 2835 (2.2)    Drains 24 (0)    Blood 150 (0.1)    Total Output 3009 0   Net +76 +375          PHYSICAL EXAMINATION: Gen: sedated on vent HEENT: scalp dressing in place, drain in place, ETT in place PULM: resps even non labored on vent, breathes over, coarse, good air entry CV: RRR,  loud diastolic/systolic murmur, no JVD AB: BS+, soft, nontender, no hsm Ext: warm, no edema, no clubbing, no cyanosis Derm: no rash or skin breakdown Neuro: minimally responsive, has followed some commands on Rt per RN, L 4-7 mm and brisk  LABS:  CBC Recent Labs     09/11/12  1328  09/11/12  2230  09/13/12  0500  WBC  15.7*   --   16.1*  HGB  9.9*  8.2*  8.3*  HCT  27.8*  24.0*  24.0*  PLT  100*   --   109*   Coag's Recent Labs     09/11/12  1328  APTT  34  INR  1.16   BMET Recent Labs     09/11/12  1328  09/11/12  2230  09/12/12  0500   09/13/12  0145  09/13/12  0500  09/13/12  0800  NA  127*  134*  138   < >  146*  147*  146*  K  4.0  4.6  4.0   --    --   3.4*   --   CL  90*   --   105   --    --   114*   --   CO2  26   --  22   --    --   23   --   BUN  7   --   10   --    --   8   --   CREATININE  0.50   --   0.59   --    --   0.43*   --   GLUCOSE  163*  158*  187*   --    --   138*   --    < > = values in this interval not displayed.   Electrolytes Recent Labs     09/11/12  1328  09/12/12  0500  09/13/12  0500  CALCIUM  9.1  8.7  8.6  MG   --    --   2.2  PHOS   --    --   2.1*   Sepsis Markers No results found for this basename: LACTICACIDVEN, PROCALCITON, O2SATVEN,  in the last 72 hours ABG Recent Labs     09/12/12  0150  PHART  7.423  PCO2ART  35.9  PO2ART  184.0*   Liver Enzymes Recent Labs     09/11/12  1328  AST  22  ALT  10  ALKPHOS  99  BILITOT  0.9  ALBUMIN  3.1*   Cardiac Enzymes No results found for this basename: TROPONINI, PROBNP,  in the last 72 hours Glucose No results found for this basename: GLUCAP,  in the last 72 hours  Ct Angio Head W/cm &/or Wo Cm  09/11/2012   CLINICAL DATA:  27 year old female with subarachnoid hemorrhage.  EXAM: CT ANGIOGRAPHY HEAD  TECHNIQUE: Multidetector CT imaging of the head was performed using the standard protocol during bolus administration of intravenous contrast. Multiplanar CT  image reconstructions including MIPs were obtained to evaluate the vascular anatomy.  CONTRAST:  50mL OMNIPAQUE IOHEXOL 350 MG/ML SOLN  COMPARISON:  [facility] non contrast head CT 09/11/2012, and Brain MRI 05/08/2006.  FINDINGS: Visualized paranasal sinuses and mastoids are clear. No acute osseous abnormality identified. Mildly dysconjugate gaze. Visualized scalp soft tissues are within normal limits.  Subarachnoid hemorrhage re- identified in the basilar cisterns and right sylvian fissure. This might be accompanied by right operculum parenchymal hemorrhage (series 3, image 11) which is increased. Small volume intraventricular hemorrhage. Stable mild to moderate ventriculomegaly. Leftward midline shift now of 5 mm, previously 2-3 mm). Right hemisphere cerebral edema. No definite cortically based infarct.  VASCULAR FINDINGS:  Limited anatomic detail of intracranial arteries, suspect due to suboptimal intravascular contrast bolus timing.  Codominant distal vertebral arteries are patent. Tortuous vertebrobasilar junction. Tortuous basilar artery is patent without stenosis.  Normal right PCA origin. Fetal type left PCA origin. Bilateral PCA branches are within normal limits.  Tortuous distal cervical right ICA. Moderately to severe tortuous bilateral cavernous ICA segments. No ICA siphon stenosis identified.  Normal left posterior communicating artery origin. No right ICA siphon aneurysm identified.  Carotid termini are patent. MCA and ACA origins are patent. Tortuous right ICA terminus and right ACA A1 segment. The left ACA A1 is non dominant. Detail of the anterior communicating artery is limited. No definite anterior communicating artery aneurysm. No definite ACA aneurysm.  Left MCA M1 segments and branches are within normal limits.  Right MCA M1 segment is patent, less distinct than the left M1 segment. No M1 segment aneurysm is identified. The right MCA M2 branches appear to remain patent. In  the sylvian fissure on the right there is a  globular area of enhancement measuring 3-4 mm diameter (series 8 image 25, and series 91478, image 30). This is suspicious for a right M2 segment saccular aneurysm, but vessel detail is suboptimal. Other right MCA branches appear to be grossly normal. Review of the MIP images confirms the above findings.  IMPRESSION: 1. Mildly increased volume of intracranial hemorrhage along the right sylvian fissure (series 3, image 11) may represent increased subarachnoid verses small intra-axial hematoma. Stable subarachnoid hemorrhage elsewhere. Stable mild to moderate ventriculomegaly.  2. Mildly increased leftward midline shift, now 5 millimeters.  3. Suboptimal intracranial artery anatomic detail. Age advanced tortuosity of the ICA siphons and the vertebrobasilar system. No ICA aneurysm identified. 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  Study discussed by telephone with Dr. Fayrene [facility] on 09/11/2012 at 15:34 .   Electronically Signed   By: Augusto Gamble M.D.   On: 09/11/2012 15:34   Ct Head Wo Contrast  09/12/2012   **ADDENDUM** CREATED: 09/12/2012 09:23:51  Critical Value/emergent results were called by telephone at the time of interpretation on 09/12/2012 at 0925 hours to Dr. Conchita Paris, who verbally acknowledged these results.  **END ADDENDUM** SIGNED BY: Dineen Kid. Chestine Spore, M.D.  09/12/2012   *RADIOLOGY REPORT*  Clinical Data: Neuro changes.  Postop craniotomy.  Subarachnoid hemorrhage.  CT HEAD WITHOUT CONTRAST  Technique:  Contiguous axial images were obtained from the base of the skull through the vertex without contrast.  Comparison: CT 09/11/2012  Findings: Right frontal craniotomy.  Aneurysm clip in the right MCA bifurcation region.  Subarachnoid hemorrhage again noted in the right sylvian fissure, similar to the preop  study.  10 mm right frontal subdural hemorrhage which is gas bubbles has developed since the prior study and extends into the interhemispheric fissure.  Right frontal ventricular shunt catheter in the right frontal horn with decompression of the right ventricle.  Left ventricle remains mildly dilated.  There is a mild amount of intraventricular hemorrhage. Increased intracranial pressure with effacement the basilar cisterns.  Low density throughout the right MCA distribution has progressed and is most compatible with acute infarct.  Some of this could be reversible edema.  Continued follow-up suggested.  There is increased mass effect and midline shift which now measures 14 mm toward the left.  IMPRESSION: Interval clipping of right MCA aneurysm.  Subarachnoid hemorrhage is unchanged.  Interval development of right frontal subdural hemorrhage measuring 10 mm.  Increased intracranial pressure with effacement of the basilar cisterns.  Evolving large area of infarction right MCA territory.  Increased midline shift which now measures 14 mm to the left.   Original Report Authenticated By: Janeece Riggers, M.D.   Dg Chest Port 1 View  09/13/2012   *RADIOLOGY REPORT*  Clinical Data: Evaluate endotracheal tube and central line  PORTABLE CHEST - 1 VIEW  Comparison: 09/12/2012  Findings: Central line, NG tube, and endotracheal tube stable. Left lung clear.  Right middle lobe consolidation slightly worse when compared to prior study.  IMPRESSION: Lines and tubes unchanged.  Slight increase in the severity of right middle lobe consolidation.   Original Report Authenticated By: Esperanza Heir, M.D.   Dg Chest Portable 1 View  09/12/2012   *RADIOLOGY REPORT*  Clinical Data: Central line insertion  PORTABLE CHEST - 1 VIEW  Comparison: 09/12/2012  Findings: Endotracheal tube in good position.  Left subclavian central venous catheter tip in  the SVC.  No pneumothorax.  NG tube in the stomach  Right lower lobe infiltrate unchanged.   Possible aspiration pneumonia.  IMPRESSION: Satisfactory central venous catheter placement.  Right lower lobe infiltrate is unchanged.  I personally called report to the operating room and gave the findings to Leonides Grills RN   Original Report Authenticated By: Janeece Riggers, M.D.   Dg Chest Port 1 View  09/12/2012   *RADIOLOGY REPORT*  Clinical Data: Evaluate endotracheal tube placement  PORTABLE CHEST - 1 VIEW  Comparison: 09/11/2012.  Findings: The endotracheal tube tip is approximately 4 cm above the carina.  Heart size is mildly enlarged.  Right basilar airspace opacities have progressed from previous exam.  IMPRESSION:  1.  Satisfactory position of ET tube with tip 4 cm above the carina. 2.  Progressive airspace consolidation within the right base.   Original Report Authenticated By: Signa Kell, M.D.   Dg Chest Port 1 View  09/11/2012   *RADIOLOGY REPORT*  Clinical Data: Subarachnoid hemorrhage  PORTABLE CHEST - 1 VIEW  Comparison: None.  Findings:  Enlarged cardiac silhouette and mediastinal contours.  Mild pulmonary venous congestion without frank evidence of edema.  Ill- defined right basilar heterogeneous airspace opacities.  No definite pleural effusion or pneumothorax.  No acute osseous abnormality.  IMPRESSION: 1.  Right basilar heterogeneous air space opacities worrisome for infection and/or aspiration. 2.  Cardiomegaly and findings of pulmonary venous congestion.   Original Report Authenticated By: Tacey Ruiz, MD   Dg Abd Portable 1v  09/12/2012   CLINICAL DATA:  OG tube placement.  EXAM: PORTABLE ABDOMEN - 1 VIEW  COMPARISON:  None.  FINDINGS: Enteric tube is coiled in the left upper quadrant consistent with location in the upper stomach. Nonobstructive bowel gas pattern.  IMPRESSION: Enteric tube is coiled in the left upper quadrant consistent with location in the upper stomach.   Electronically Signed   By: Burman Nieves   On: 09/12/2012 04:04     ASSESSMENT /  PLAN:  NEUROLOGIC A:  SAH, s/p R MCA clipping c/b R MCA territory CVA, edema and effacement P:   -to OR 9/12 for craniectomy -3% saline started 9/12 -nsgy following ventric  PULMONARY A: Post op respiratory failure > good vent mechanics/oxygenation CAP vs aspiration pneumonia (favor aspiration) Asthma> not in exacerbation P:   -Trial PS wean if tol - limiting factor will be mental status  CARDIOVASCULAR A: Loud heart murmur > 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 HD stable P:  -tele -clarify history with father/obtain records -consider echo   RENAL A:  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  CRITICAL CARE: The patient is critically ill with multiple organ systems failure and requires high complexity decision making for assessment and support, frequent evaluation and titration of therapies, application of advanced monitoring technologies and extensive interpretation of multiple databases. Critical Care Time devoted to patient care services described in this note is 32 minutes.     *  Care during the described time interval was provided by me and/or other providers on the critical care team. I have reviewed this patient's available data, including medical history, events of note, physical examination and test results as part of my evaluation.   Cyril Mourning MD. Tonny Bollman. [facility] Pager [phone] If  no response call [phone]

## 2012-09-13 NOTE — Progress Notes (Signed)
NUTRITION FOLLOW UP  Intervention:    1. Initiate Vital AF 1.2 @ 20 ml/hr via OG tube and increase by 10 ml every 4 hours to goal rate of 50 ml/hr.   At goal rate, tube feeding regimen will provide 1440 kcal, 90 grams of protein, and 973 ml of H2O.    Nutrition Dx:   Inadequate oral intake related to inability to eat as evidenced by NPO status; ongoing.    Goal:  Intake to meet >90% of estimated nutrition needs, not yet met.   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 Asked by MD to assist with TF.  Patient is currently intubated on ventilator support.  MV: 7 L/min Temp:Temp (24hrs), Avg:98 F (36.7 C), Min:97.2 F (36.2 C), Max:98.9 F (37.2 C)  Propofol: off  Height: Ht Readings from Last 1 Encounters:  09/11/12 5\' 1"  (1.549 m)    Weight Status:   Wt Readings from Last 1 Encounters:  09/11/12 118 lb 2.7 oz (53.6 kg)    Re-estimated needs:  Kcal: 1450  Protein: 75-85 gm  Fluid: 1.5-1.6 L  Skin: head and abd incision  Diet Order: NPO   Intake/Output Summary (Last 24 hours) at 09/13/12 1446 Last data filed at 09/13/12 1400  Gross per 24 hour  Intake   2660 ml  Output   3010 ml  Net   -350 ml    Last BM: PTA   Labs:   Recent Labs Lab 09/11/12 1328 09/11/12 2230 09/12/12 0500  09/13/12 0145 09/13/12 0500 09/13/12 0800 09/13/12 1400  NA 127* 134* 138  < > 146* 147* 146* 147*  K 4.0 4.6 4.0  --   --  3.4*  --   --   CL 90*  --  105  --   --  114*  --   --   CO2 26  --  22  --   --  23  --   --   BUN 7  --  10  --   --  8  --   --   CREATININE 0.50  --  0.59  --   --  0.43*  --   --   CALCIUM 9.1  --  8.7  --   --  8.6  --   --   MG  --   --   --   --   --  2.2  --   --   PHOS  --   --   --   --   --  2.1*  --   --   GLUCOSE 163* 158* 187*  --   --  138*  --   --   < > = values in this interval not displayed.  CBG (last 3)  No  results found for this basename: GLUCAP,  in the last 72 hours  Scheduled Meds: . ampicillin-sulbactam (UNASYN) IV  3 g Intravenous Q6H  . antiseptic oral rinse  15 mL Mouth Rinse QID  . chlorhexidine  15 mL Mouth Rinse BID  . feeding supplement (VITAL AF 1.2 CAL)  1,000 mL Per Tube Q24H  . indocyanine green  25 mg Intravenous Once  . levETIRAcetam  500 mg Intravenous Q12H  . NiMODipine  60 mg Per Tube Q4H   Or  . niMODipine  60 mg Oral Q4H  . pantoprazole (PROTONIX) IV  40 mg Intravenous QHS  .  papaverine  60 mg Intravenous Once  . potassium phosphate IVPB (mEq)  40 mEq Intravenous Once  . senna-docusate  1 tablet Oral BID    Continuous Infusions: . sodium chloride Stopped (09/12/12 1230)  . sodium chloride (hypertonic) 75 mL/hr at 09/13/12 [address], LDN, CNSC [phone] Pager [phone] After Hours Pager

## 2012-09-13 NOTE — Progress Notes (Signed)
RT note: PS wean trial done with no spontaneous effort @ this time, RN made aware @ bedside, plan to attempt again in am 9/14.

## 2012-09-13 NOTE — Progress Notes (Signed)
Subjective: Patient reports intubated.  Objective: Vital signs in last 24 hours: Temp:  [97.2 F (36.2 C)-98.9 F (37.2 C)] 97.9 F (36.6 C) (09/13 1000) Pulse Rate:  [62-93] 64 (09/13 1000) Resp:  [14-16] 14 (09/13 1000) BP: (109-124)/(60-79) 115/63 mmHg (09/13 1000) SpO2:  [99 %-100 %] 100 % (09/13 1000) Arterial Line BP: (132-152)/(56-74) 147/63 mmHg (09/13 1000) FiO2 (%):  [40 %] 40 % (09/13 0817)  Intake/Output from previous day: 09/12 0701 - 09/13 0700 In: 3085 [I.V.:2475; IV Piggyback:610] Out: 3009 [Urine:2835; Drains:24; Blood:150] Intake/Output this shift: Total I/O In: 375 [I.V.:225; NG/GT:150] Out: 0   Physical Exam: Patient briskly following commands on right side, minimal movement on left.  Left pupil 6 mm and reactive.  IVC not draining.  Lab Results:  Recent Labs  09/11/12 1328 09/11/12 2230 09/13/12 0500  WBC 15.7*  --  16.1*  HGB 9.9* 8.2* 8.3*  HCT 27.8* 24.0* 24.0*  PLT 100*  --  109*   BMET  Recent Labs  09/12/12 0500  09/13/12 0500 09/13/12 0800  NA 138  < > 147* 146*  K 4.0  --  3.4*  --   CL 105  --  114*  --   CO2 22  --  23  --   GLUCOSE 187*  --  138*  --   BUN 10  --  8  --   CREATININE 0.59  --  0.43*  --   CALCIUM 8.7  --  8.6  --   < > = values in this interval not displayed.  Studies/Results: Ct Angio Head W/cm &/or Wo Cm  09/11/2012   CLINICAL DATA:  27 year old female with subarachnoid hemorrhage.  EXAM: CT ANGIOGRAPHY HEAD  TECHNIQUE: Multidetector CT imaging of the head was performed using the standard protocol during bolus administration of intravenous contrast. Multiplanar CT image reconstructions including MIPs were obtained to evaluate the vascular anatomy.  CONTRAST:  50mL OMNIPAQUE IOHEXOL 350 MG/ML SOLN  COMPARISON:  [facility] non contrast head CT 09/11/2012, and Brain MRI 05/08/2006.  FINDINGS: Visualized paranasal sinuses and mastoids are clear. No acute osseous abnormality identified.  Mildly dysconjugate gaze. Visualized scalp soft tissues are within normal limits.  Subarachnoid hemorrhage re- identified in the basilar cisterns and right sylvian fissure. This might be accompanied by right operculum parenchymal hemorrhage (series 3, image 11) which is increased. Small volume intraventricular hemorrhage. Stable mild to moderate ventriculomegaly. Leftward midline shift now of 5 mm, previously 2-3 mm). Right hemisphere cerebral edema. No definite cortically based infarct.  VASCULAR FINDINGS:  Limited anatomic detail of intracranial arteries, suspect due to suboptimal intravascular contrast bolus timing.  Codominant distal vertebral arteries are patent. Tortuous vertebrobasilar junction. Tortuous basilar artery is patent without stenosis.  Normal right PCA origin. Fetal type left PCA origin. Bilateral PCA branches are within normal limits.  Tortuous distal cervical right ICA. Moderately to severe tortuous bilateral cavernous ICA segments. No ICA siphon stenosis identified.  Normal left posterior communicating artery origin. No right ICA siphon aneurysm identified.  Carotid termini are patent. MCA and ACA origins are patent. Tortuous right ICA terminus and right ACA A1 segment. The left ACA A1 is non dominant. Detail of the anterior communicating artery is limited. No definite anterior communicating artery aneurysm. No definite ACA aneurysm.  Left MCA M1 segments and branches are within normal limits.  Right MCA M1 segment is patent, less distinct than the left M1 segment. No M1 segment aneurysm is identified. The right MCA M2 branches  appear to remain patent. In the sylvian fissure on the right there is a globular area of enhancement measuring 3-4 mm diameter (series 8 image 25, and series 40981, image 30). This is suspicious for a right M2 segment saccular aneurysm, but vessel detail is suboptimal. Other right MCA branches appear to be grossly normal. Review of the MIP images confirms the above  findings.  IMPRESSION: 1. Mildly increased volume of intracranial hemorrhage along the right sylvian fissure (series 3, image 11) may represent increased subarachnoid verses small intra-axial hematoma. Stable subarachnoid hemorrhage elsewhere. Stable mild to moderate ventriculomegaly.  2. Mildly increased leftward midline shift, now 5 millimeters.  3. Suboptimal intracranial artery anatomic detail. Age advanced tortuosity of the ICA siphons and the vertebrobasilar system. No ICA aneurysm identified. 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  Study discussed by telephone with Dr. Fayrene Fearing [facility] on 09/11/2012 at 15:34 .   Electronically Signed   By: Augusto Gamble M.D.   On: 09/11/2012 15:34   Ct Head Wo Contrast  09/12/2012   **ADDENDUM** CREATED: 09/12/2012 09:23:51  Critical Value/emergent results were called by telephone at the time of interpretation on 09/12/2012 at 0925 hours to Dr. Conchita Paris, who verbally acknowledged these results.  **END ADDENDUM** SIGNED BY: Dineen Kid. Chestine Spore, M.D.  09/12/2012   *RADIOLOGY REPORT*  Clinical Data: Neuro changes.  Postop craniotomy.  Subarachnoid hemorrhage.  CT HEAD WITHOUT CONTRAST  Technique:  Contiguous axial images were obtained from the base of the skull through the vertex without contrast.  Comparison: CT 09/11/2012  Findings: Right frontal craniotomy.  Aneurysm clip in the right MCA bifurcation region.  Subarachnoid hemorrhage again noted in the right sylvian fissure, similar to the preop study.  10 mm right frontal subdural hemorrhage which is gas bubbles has developed since the prior study and extends into the interhemispheric fissure.  Right frontal ventricular shunt catheter in the right frontal horn with decompression of the right ventricle.  Left ventricle remains mildly dilated.  There is a mild amount of  intraventricular hemorrhage. Increased intracranial pressure with effacement the basilar cisterns.  Low density throughout the right MCA distribution has progressed and is most compatible with acute infarct.  Some of this could be reversible edema.  Continued follow-up suggested.  There is increased mass effect and midline shift which now measures 14 mm toward the left.  IMPRESSION: Interval clipping of right MCA aneurysm.  Subarachnoid hemorrhage is unchanged.  Interval development of right frontal subdural hemorrhage measuring 10 mm.  Increased intracranial pressure with effacement of the basilar cisterns.  Evolving large area of infarction right MCA territory.  Increased midline shift which now measures 14 mm to the left.   Original Report Authenticated By: Janeece Riggers, M.D.   Dg Chest Port 1 View  09/13/2012   *RADIOLOGY REPORT*  Clinical Data: Evaluate endotracheal tube and central line  PORTABLE CHEST - 1 VIEW  Comparison: 09/12/2012  Findings: Central line, NG tube, and endotracheal tube stable. Left lung clear.  Right middle lobe consolidation slightly worse when compared to prior study.  IMPRESSION: Lines and tubes unchanged.  Slight increase in the severity of right middle lobe consolidation.   Original Report Authenticated By: Esperanza Heir, M.D.   Dg Chest Portable 1 View  09/12/2012   *RADIOLOGY REPORT*  Clinical Data: Central line insertion  PORTABLE CHEST - 1 VIEW  Comparison: 09/12/2012  Findings: Endotracheal tube in good position.  Left subclavian central venous catheter tip in the SVC.  No pneumothorax.  NG tube in the stomach  Right lower lobe infiltrate unchanged.  Possible aspiration pneumonia.  IMPRESSION: Satisfactory central venous catheter placement.  Right lower lobe infiltrate is unchanged.  I personally called report to the operating room and gave the findings to Leonides Grills RN   Original Report Authenticated By: Janeece Riggers, M.D.   Dg Chest Port 1 View  09/12/2012    *RADIOLOGY REPORT*  Clinical Data: Evaluate endotracheal tube placement  PORTABLE CHEST - 1 VIEW  Comparison: 09/11/2012.  Findings: The endotracheal tube tip is approximately 4 cm above the carina.  Heart size is mildly enlarged.  Right basilar airspace opacities have progressed from previous exam.  IMPRESSION:  1.  Satisfactory position of ET tube with tip 4 cm above the carina. 2.  Progressive airspace consolidation within the right base.   Original Report Authenticated By: Signa Kell, M.D.   Dg Chest Port 1 View  09/11/2012   *RADIOLOGY REPORT*  Clinical Data: Subarachnoid hemorrhage  PORTABLE CHEST - 1 VIEW  Comparison: None.  Findings:  Enlarged cardiac silhouette and mediastinal contours.  Mild pulmonary venous congestion without frank evidence of edema.  Ill- defined right basilar heterogeneous airspace opacities.  No definite pleural effusion or pneumothorax.  No acute osseous abnormality.  IMPRESSION: 1.  Right basilar heterogeneous air space opacities worrisome for infection and/or aspiration. 2.  Cardiomegaly and findings of pulmonary venous congestion.   Original Report Authenticated By: Tacey Ruiz, MD   Dg Abd Portable 1v  09/12/2012   CLINICAL DATA:  OG tube placement.  EXAM: PORTABLE ABDOMEN - 1 VIEW  COMPARISON:  None.  FINDINGS: Enteric tube is coiled in the left upper quadrant consistent with location in the upper stomach. Nonobstructive bowel gas pattern.  IMPRESSION: Enteric tube is coiled in the left upper quadrant consistent with location in the upper stomach.   Electronically Signed   By: Burman Nieves   On: 09/12/2012 04:04    Assessment/Plan: Improving neurologic exam.  Continue full support.  Minimal vent weaning today.  Low yield to replace IVC, so will leave as is.    LOS: 2 days    Dorian Heckle, MD 09/13/2012, 10:45 AM

## 2012-09-14 ENCOUNTER — Inpatient Hospital Stay (HOSPITAL_COMMUNITY): Payer: Medicaid Other

## 2012-09-14 LAB — CBC
HCT: 24.3 % — ABNORMAL LOW (ref 36.0–46.0)
MCV: 72.8 fL — ABNORMAL LOW (ref 78.0–100.0)
Platelets: 119 10*3/uL — ABNORMAL LOW (ref 150–400)
RBC: 3.34 MIL/uL — ABNORMAL LOW (ref 3.87–5.11)
WBC: 14.9 10*3/uL — ABNORMAL HIGH (ref 4.0–10.5)

## 2012-09-14 LAB — GLUCOSE, CAPILLARY
Glucose-Capillary: 115 mg/dL — ABNORMAL HIGH (ref 70–99)
Glucose-Capillary: 127 mg/dL — ABNORMAL HIGH (ref 70–99)

## 2012-09-14 LAB — BASIC METABOLIC PANEL
BUN: 8 mg/dL (ref 6–23)
CO2: 24 mEq/L (ref 19–32)
Calcium: 8.5 mg/dL (ref 8.4–10.5)
Chloride: 117 mEq/L — ABNORMAL HIGH (ref 96–112)
Creatinine, Ser: 0.44 mg/dL — ABNORMAL LOW (ref 0.50–1.10)
GFR calc Af Amer: >90 mL/min (ref >90)
GFR calc non Af Amer: >90 mL/min (ref >90)
Glucose, Bld: 122 mg/dL — ABNORMAL HIGH (ref 70–99)
Potassium: 3.2 mEq/L — ABNORMAL LOW (ref 3.5–5.1)
Sodium: 148 mEq/L — ABNORMAL HIGH (ref 135–145)

## 2012-09-14 LAB — SODIUM
Sodium: 147 mEq/L — ABNORMAL HIGH (ref 135–145)
Sodium: 150 mEq/L — ABNORMAL HIGH (ref 135–145)

## 2012-09-14 LAB — CULTURE, RESPIRATORY W GRAM STAIN

## 2012-09-14 MED ORDER — POTASSIUM CHLORIDE 20 MEQ/15ML (10%) PO LIQD
40.0000 meq | Freq: Once | ORAL | Status: AC
  Administered 2012-09-14: 40 meq
  Filled 2012-09-14: qty 30

## 2012-09-14 NOTE — Progress Notes (Signed)
RT Note: Pulled ALINE at this time following MD order. Site looks good. No swelling noted. RT held site with gauze at least 5 min to be sure no hematoma formed. RT will continue to assist as needed.  Ellen Henri RRT RCP

## 2012-09-14 NOTE — Progress Notes (Signed)
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 Following commands.  Tol PS 8/5.  Afebrile Good UO  VITAL SIGNS: Temp:  [97.4 F (36.3 C)-98.8 F (37.1 C)] 97.8 F (36.6 C) (09/14 0800) Pulse Rate:  [58-105] 105 (09/14 0830) Resp:  [14-19] 15 (09/14 0830) BP: (106-156)/(52-75) 119/63 mmHg (09/14 0830) SpO2:  [100 %] 100 % (09/14 0830) Arterial Line BP: (129-162)/(50-72) 162/63 mmHg (09/14 0830) FiO2 (%):  [30 %-40 %] 30 % (09/14 0830) Weight:  [117 lb 1 oz (53.1 kg)] 117 lb 1 oz (53.1 kg) (09/14 0500) HEMODYNAMICS:   VENTILATOR SETTINGS: Vent Mode:  [-] PSV;CPAP FiO2 (%):  [30 %-40 %] 30 % Set Rate:  [14 bmp] 14 bmp Vt Set:  [500 mL] 500 mL PEEP:  [5 cmH20] 5 cmH20 Pressure Support:  [5 cmH20-8 cmH20] 8 cmH20 Plateau Pressure:  [20 cmH20-23 cmH20] 22 cmH20 INTAKE / OUTPUT: Intake/Output     09/13 0701 - 09/14 0700 09/14 0701 - 09/15 0700   I.V. (mL/kg) 1725 (32.5)    NG/GT 685    IV Piggyback 1000    Total Intake(mL/kg) 3410 (64.2)    Urine (mL/kg/hr) 3890 (3.1)    Drains     Blood     Total Output 3890     Net -480            PHYSICAL EXAMINATION: Gen: young female, NAD on vent  HEENT: scalp dressing in place, drain  in place, ETT in place PULM: resps even non labored on PS 8/5, coarse, good air entry CV: RRR, loud diastolic/systolic murmur, no JVD AB: BS+, soft, nontender, no hsm Ext: warm, no edema, no clubbing, no cyanosis Derm: no rash or skin breakdown Neuro: awake, alert, appropriate, follows commands, waves when I enter room, minimal mvmt LUE, weak LLE, moves R side well.   LABS:  CBC Recent Labs     09/11/12  1328  09/11/12  2230  09/13/12  0500  09/14/12  0445  WBC  15.7*   --   16.1*  14.9*  HGB  9.9*  8.2*  8.3*  8.2*  HCT  27.8*  24.0*  24.0*  24.3*  PLT  100*   --   109*  119*   Coag's Recent Labs     09/11/12  1328  APTT  34  INR  1.16   BMET Recent Labs     09/12/12  0500   09/13/12  0500   09/13/12  1955  09/14/12  0100  09/14/12  0445  NA  138   < >  147*   < >  147*  147*  148*  K  4.0   --   3.4*   --    --    --  3.2*  CL  105   --   114*   --    --    --   117*  CO2  22   --   23   --    --    --   24  BUN  10   --   8   --    --    --   8  CREATININE  0.59   --   0.43*   --    --    --   0.44*  GLUCOSE  187*   --   138*   --    --    --   122*   < > = values in this interval not displayed.   Electrolytes Recent Labs     09/12/12  0500  09/13/12  0500  09/14/12  0445  CALCIUM  8.7  8.6  8.5  MG   --   2.2   --   PHOS   --   2.1*   --    Sepsis Markers No results found for this basename: LACTICACIDVEN, PROCALCITON, O2SATVEN,  in the last 72 hours ABG Recent Labs     09/12/12  0150  PHART  7.423  PCO2ART  35.9  PO2ART  184.0*   Liver Enzymes Recent Labs     09/11/12  1328  AST  22  ALT  10  ALKPHOS  99  BILITOT  0.9  ALBUMIN  3.1*   Cardiac Enzymes No results found for this basename: TROPONINI, PROBNP,  in the last 72 hours Glucose Recent Labs     09/13/12  1631  09/13/12  2021  09/14/12  0013  09/14/12  0406  GLUCAP  120*  140*  113*  115*    Ct Head Wo Contrast  09/12/2012   **ADDENDUM** CREATED: 09/12/2012 09:23:51   Critical Value/emergent results were called by telephone at the time of interpretation on 09/12/2012 at 0925 hours to Dr. Conchita Paris, who verbally acknowledged these results.  **END ADDENDUM** SIGNED BY: Dineen Kid. Chestine Spore, M.D.  09/12/2012   *RADIOLOGY REPORT*  Clinical Data: Neuro changes.  Postop craniotomy.  Subarachnoid hemorrhage.  CT HEAD WITHOUT CONTRAST  Technique:  Contiguous axial images were obtained from the base of the skull through the vertex without contrast.  Comparison: CT 09/11/2012  Findings: Right frontal craniotomy.  Aneurysm clip in the right MCA bifurcation region.  Subarachnoid hemorrhage again noted in the right sylvian fissure, similar to the preop study.  10 mm right frontal subdural hemorrhage which is gas bubbles has developed since the prior study and extends into the interhemispheric fissure.  Right frontal ventricular shunt catheter in the right frontal horn with decompression of the right ventricle.  Left ventricle remains mildly dilated.  There is a mild amount of intraventricular hemorrhage. Increased intracranial pressure with effacement the basilar cisterns.  Low density throughout the right MCA distribution has progressed and is most compatible with acute infarct.  Some of this could be reversible edema.  Continued follow-up suggested.  There is increased mass effect and midline shift which now measures 14 mm toward the left.  IMPRESSION: Interval clipping of right MCA aneurysm.  Subarachnoid hemorrhage is unchanged.  Interval development of right frontal subdural hemorrhage measuring 10 mm.  Increased intracranial pressure with effacement of the basilar cisterns.  Evolving large area of infarction right MCA territory.  Increased midline shift which now measures 14 mm to the left.   Original  Report Authenticated By: Janeece Riggers, M.D.   Dg Chest Portable 1 View  09/14/2012   *RADIOLOGY REPORT*  Clinical Data: Follow up respiratory failure  PORTABLE CHEST - 1 VIEW  Comparison:  Prior chest x-ray 09/13/2012  Findings: The patient remains intubated.  The tip the endotracheal tube is 5.3 cm above the carina.  Left subclavian approach central venous catheter unchanged position with the tip in the mid SVC. Nasogastric tube is noted.  A portion is visualized coiled in the gastric fundus.  The tip lies off the field of view.  Persistent right middle lobe airspace infiltrate.  Cardiac and mediastinal contours remain unchanged.  No new airspace consolidation, pleural effusion or pneumothorax.  IMPRESSION:  1.  Stable and satisfactory support apparatus. 2.  Persistent but perhaps slightly improved right middle lobe infiltrate.   Original Report Authenticated By: Malachy Moan, M.D.   Dg Chest Port 1 View  09/13/2012   *RADIOLOGY REPORT*  Clinical Data: Evaluate endotracheal tube and central line  PORTABLE CHEST - 1 VIEW  Comparison: 09/12/2012  Findings: Central line, NG tube, and endotracheal tube stable. Left lung clear.  Right middle lobe consolidation slightly worse when compared to prior study.  IMPRESSION: Lines and tubes unchanged.  Slight increase in the severity of right middle lobe consolidation.   Original Report Authenticated By: Esperanza Heir, M.D.   Dg Chest Portable 1 View  09/12/2012   *RADIOLOGY REPORT*  Clinical Data: Central line insertion  PORTABLE CHEST - 1 VIEW  Comparison: 09/12/2012  Findings: Endotracheal tube in good position.  Left subclavian central venous catheter tip in the SVC.  No pneumothorax.  NG tube in the stomach  Right lower lobe infiltrate unchanged.  Possible aspiration pneumonia.  IMPRESSION: Satisfactory central venous catheter placement.  Right lower lobe infiltrate is unchanged.  I personally called report to the operating room and gave the findings to Leonides Grills RN   Original Report Authenticated By: Janeece Riggers, M.D.     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 P:   -as per neuro section, cont 3% saline -f/u chem   GASTROINTESTINAL A:  No acute issues P:   -tol TF  -Pantoprazole for stress ulcer prophylaxis  HEMATOLOGIC A:  Anemia P:  -monitor h/h -transfusion threshold < 7gm/dl  INFECTIOUS A:  Aspiration pneumonia P:   -unasyn -f/u cultures  ENDOCRINE A:  No acute issues P:   -monitor glucose   CRITICAL CARE: The patient is critically ill with multiple organ systems failure and requires high complexity decision making for assessment and support, frequent evaluation and titration of therapies, application of advanced monitoring technologies and extensive interpretation of multiple databases. Critical Care Time devoted to patient care services described in this note is 35 minutes.    *Care during the described time interval was provided by me and/or other providers on the  critical care team. I have reviewed this patient's available data, including medical history, events of note, physical examination and test results as part of my evaluation.  Cyril Mourning MD. Tonny Bollman. [facility] Pager [phone] If no response call [phone]

## 2012-09-14 NOTE — Progress Notes (Signed)
Subjective: Patient reports following commands on vent.  Objective: Vital signs in last 24 hours: Temp:  [97.4 F (36.3 C)-98.8 F (37.1 C)] 98.8 F (37.1 C) (09/14 0400) Pulse Rate:  [58-90] 68 (09/14 0700) Resp:  [14-19] 14 (09/14 0700) BP: (106-156)/(52-75) 116/59 mmHg (09/14 0700) SpO2:  [100 %] 100 % (09/14 0700) Arterial Line BP: (135-162)/(50-71) 162/59 mmHg (09/14 0700) FiO2 (%):  [30 %-40 %] 30 % (09/14 0330) Weight:  [53.1 kg (117 lb 1 oz)] 53.1 kg (117 lb 1 oz) (09/14 0500)  Intake/Output from previous day: 09/13 0701 - 09/14 0700 In: 3410 [I.V.:1725; NG/GT:685; IV Piggyback:1000] Out: 3890 [Urine:3890] Intake/Output this shift:    Physical Exam: More awake, following commands briskly on right.  Some spontaneous movement left leg, none in arm.  Pupils are both small and reactive.  Tracks with eyes.  IVC not working.  Lab Results:  Recent Labs  09/13/12 0500 09/14/12 0445  WBC 16.1* 14.9*  HGB 8.3* 8.2*  HCT 24.0* 24.3*  PLT 109* 119*   BMET  Recent Labs  09/13/12 0500  09/14/12 0100 09/14/12 0445  NA 147*  < > 147* 148*  K 3.4*  --   --  3.2*  CL 114*  --   --  117*  CO2 23  --   --  24  GLUCOSE 138*  --   --  122*  BUN 8  --   --  8  CREATININE 0.43*  --   --  0.44*  CALCIUM 8.6  --   --  8.5  < > = values in this interval not displayed.  Studies/Results: Ct Head Wo Contrast  09/12/2012   **ADDENDUM** CREATED: 09/12/2012 09:23:51  Critical Value/emergent results were called by telephone at the time of interpretation on 09/12/2012 at 0925 hours to Dr. Conchita Paris, who verbally acknowledged these results.  **END ADDENDUM** SIGNED BY: Dineen Kid. Chestine Spore, M.D.  09/12/2012   *RADIOLOGY REPORT*  Clinical Data: Neuro changes.  Postop craniotomy.  Subarachnoid hemorrhage.  CT HEAD WITHOUT CONTRAST  Technique:  Contiguous axial images were obtained from the base of the skull through the vertex without contrast.  Comparison: CT 09/11/2012  Findings: Right frontal  craniotomy.  Aneurysm clip in the right MCA bifurcation region.  Subarachnoid hemorrhage again noted in the right sylvian fissure, similar to the preop study.  10 mm right frontal subdural hemorrhage which is gas bubbles has developed since the prior study and extends into the interhemispheric fissure.  Right frontal ventricular shunt catheter in the right frontal horn with decompression of the right ventricle.  Left ventricle remains mildly dilated.  There is a mild amount of intraventricular hemorrhage. Increased intracranial pressure with effacement the basilar cisterns.  Low density throughout the right MCA distribution has progressed and is most compatible with acute infarct.  Some of this could be reversible edema.  Continued follow-up suggested.  There is increased mass effect and midline shift which now measures 14 mm toward the left.  IMPRESSION: Interval clipping of right MCA aneurysm.  Subarachnoid hemorrhage is unchanged.  Interval development of right frontal subdural hemorrhage measuring 10 mm.  Increased intracranial pressure with effacement of the basilar cisterns.  Evolving large area of infarction right MCA territory.  Increased midline shift which now measures 14 mm to the left.   Original Report Authenticated By: Janeece Riggers, M.D.   Dg Chest Portable 1 View  09/14/2012   *RADIOLOGY REPORT*  Clinical Data: Follow up respiratory failure  PORTABLE CHEST - 1  VIEW  Comparison: Prior chest x-ray 09/13/2012  Findings: The patient remains intubated.  The tip the endotracheal tube is 5.3 cm above the carina.  Left subclavian approach central venous catheter unchanged position with the tip in the mid SVC. Nasogastric tube is noted.  A portion is visualized coiled in the gastric fundus.  The tip lies off the field of view.  Persistent right middle lobe airspace infiltrate.  Cardiac and mediastinal contours remain unchanged.  No new airspace consolidation, pleural effusion or pneumothorax.  IMPRESSION:   1.  Stable and satisfactory support apparatus. 2.  Persistent but perhaps slightly improved right middle lobe infiltrate.   Original Report Authenticated By: Malachy Moan, M.D.   Dg Chest Port 1 View  09/13/2012   *RADIOLOGY REPORT*  Clinical Data: Evaluate endotracheal tube and central line  PORTABLE CHEST - 1 VIEW  Comparison: 09/12/2012  Findings: Central line, NG tube, and endotracheal tube stable. Left lung clear.  Right middle lobe consolidation slightly worse when compared to prior study.  IMPRESSION: Lines and tubes unchanged.  Slight increase in the severity of right middle lobe consolidation.   Original Report Authenticated By: Esperanza Heir, M.D.   Dg Chest Portable 1 View  09/12/2012   *RADIOLOGY REPORT*  Clinical Data: Central line insertion  PORTABLE CHEST - 1 VIEW  Comparison: 09/12/2012  Findings: Endotracheal tube in good position.  Left subclavian central venous catheter tip in the SVC.  No pneumothorax.  NG tube in the stomach  Right lower lobe infiltrate unchanged.  Possible aspiration pneumonia.  IMPRESSION: Satisfactory central venous catheter placement.  Right lower lobe infiltrate is unchanged.  I personally called report to the operating room and gave the findings to Leonides Grills RN   Original Report Authenticated By: Janeece Riggers, M.D.    Assessment/Plan: Improving neuro exam.  Continue 3% NaCl at 75 cc / hour.  Na currently 147.  Neurologic exam improving.  Will hold on vent wean today.  ECHO pending to assess valvular heart disease and murmur.  Possible embolic source of anatomically unusual aneurysm?  Has SCD's, will hold on lovenox for time being.  If stable, may elect for repeat Head CT in am and remove IVC if no longer needed or out of position.  Otherwise, may irrigate to restore function.  Patient still not stable medically to lay flat for CT.    LOS: 3 days    Dorian Heckle, MD 09/14/2012, 7:45 AM

## 2012-09-15 ENCOUNTER — Inpatient Hospital Stay (HOSPITAL_COMMUNITY): Payer: Medicaid Other

## 2012-09-15 DIAGNOSIS — I10 Essential (primary) hypertension: Secondary | ICD-10-CM | POA: Diagnosis present

## 2012-09-15 DIAGNOSIS — E876 Hypokalemia: Secondary | ICD-10-CM | POA: Diagnosis not present

## 2012-09-15 LAB — GLUCOSE, CAPILLARY
Glucose-Capillary: 104 mg/dL — ABNORMAL HIGH (ref 70–99)
Glucose-Capillary: 104 mg/dL — ABNORMAL HIGH (ref 70–99)
Glucose-Capillary: 120 mg/dL — ABNORMAL HIGH (ref 70–99)
Glucose-Capillary: 121 mg/dL — ABNORMAL HIGH (ref 70–99)

## 2012-09-15 LAB — SODIUM
Sodium: 149 mEq/L — ABNORMAL HIGH (ref 135–145)
Sodium: 152 mEq/L — ABNORMAL HIGH (ref 135–145)

## 2012-09-15 LAB — TYPE AND SCREEN
ABO/RH(D): A POS
Antibody Screen: NEGATIVE
Unit division: 0
Unit division: 0

## 2012-09-15 LAB — CBC
MCH: 24.7 pg — ABNORMAL LOW (ref 26.0–34.0)
MCHC: 33.5 g/dL (ref 30.0–36.0)
MCV: 73.8 fL — ABNORMAL LOW (ref 78.0–100.0)
Platelets: 143 10*3/uL — ABNORMAL LOW (ref 150–400)
RBC: 3.44 MIL/uL — ABNORMAL LOW (ref 3.87–5.11)

## 2012-09-15 LAB — BASIC METABOLIC PANEL
BUN: 9 mg/dL (ref 6–23)
CO2: 26 mEq/L (ref 19–32)
Calcium: 9.4 mg/dL (ref 8.4–10.5)
Creatinine, Ser: 0.45 mg/dL — ABNORMAL LOW (ref 0.50–1.10)
Glucose, Bld: 128 mg/dL — ABNORMAL HIGH (ref 70–99)

## 2012-09-15 LAB — CLOSTRIDIUM DIFFICILE BY PCR: Toxigenic C. Difficile by PCR: NEGATIVE

## 2012-09-15 MED ORDER — SIMVASTATIN 80 MG PO TABS
80.0000 mg | ORAL_TABLET | Freq: Every day | ORAL | Status: DC
  Administered 2012-09-15 – 2012-10-15 (×30): 80 mg via ORAL
  Filled 2012-09-15 (×33): qty 1

## 2012-09-15 MED ORDER — POTASSIUM PHOSPHATE DIBASIC 3 MMOLE/ML IV SOLN
30.0000 mmol | Freq: Once | INTRAVENOUS | Status: AC
  Administered 2012-09-15: 30 mmol via INTRAVENOUS
  Filled 2012-09-15: qty 10

## 2012-09-15 MED FILL — Ondansetron HCl Inj 4 MG/2ML (2 MG/ML): INTRAMUSCULAR | Qty: 2 | Status: AC

## 2012-09-15 NOTE — Progress Notes (Signed)
  Echocardiogra 2D Echocardiogram has been performed.  Cathie Beams 09/15/2012, 1:53 PM

## 2012-09-15 NOTE — Progress Notes (Signed)
ANTIBIOTIC CONSULT NOTE - FOLLOW UP  Pharmacy Consult for Unasyn Indication: Aspiration PNA  Allergies  Allergen Reactions  . Contrast Media [Iodinated Diagnostic Agents] Hives    Patient Measurements: Height: 5\' 1"  (154.9 cm) Weight: 117 lb 15.1 oz (53.5 kg) IBW/kg (Calculated) : 47.8 Adjusted Body Weight:   Vital Signs: Temp: 98.8 F (37.1 C) (09/15 0800) Temp src: Axillary (09/15 0800) BP: 130/77 mmHg (09/15 1000) Pulse Rate: 95 (09/15 1000) Intake/Output from previous day: 09/14 0701 - 09/15 0700 In: 3480 [I.V.:1725; NG/GT:1150; IV Piggyback:605] Out: 4675 [Urine:4675] Intake/Output from this shift: Total I/O In: 375 [I.V.:225; NG/GT:150] Out: 575 [Urine:575]  Labs:  Recent Labs  09/13/12 0500 09/14/12 0445 09/15/12 0430  WBC 16.1* 14.9* 14.2*  HGB 8.3* 8.2* 8.5*  PLT 109* 119* 143*  CREATININE 0.43* 0.44* 0.45*   Estimated Creatinine Clearance: 81.8 ml/min (by C-G formula based on Cr of 0.45). No results found for this basename: VANCOTROUGH, Leodis Binet, VANCORANDOM, GENTTROUGH, GENTPEAK, GENTRANDOM, TOBRATROUGH, TOBRAPEAK, TOBRARND, AMIKACINPEAK, AMIKACINTROU, AMIKACIN,  in the last 72 hours   Microbiology: Recent Results (from the past 720 hour(s))  MRSA PCR SCREENING     Status: None   Collection Time    09/11/12  1:32 PM      Result Value Range Status   MRSA by PCR NEGATIVE  NEGATIVE Final   Comment:            The GeneXpert MRSA Assay (FDA     approved for NASAL specimens     only), is one component of a     comprehensive MRSA colonization     surveillance program. It is not     intended to diagnose MRSA     infection nor to guide or     monitor treatment for     MRSA infections.  CULTURE, BLOOD (ROUTINE X 2)     Status: None   Collection Time    09/12/12  3:48 AM      Result Value Range Status   Specimen Description BLOOD RIGHT ANTECUBITAL   Final   Special Requests BOTTLES DRAWN AEROBIC ONLY 5CC   Final   Culture  Setup Time     Final   Value: 09/12/2012 11:37     Performed at [facility]   Culture     Final   Value:        BLOOD CULTURE RECEIVED NO GROWTH TO DATE CULTURE WILL BE HELD FOR 5 DAYS BEFORE ISSUING A FINAL NEGATIVE REPORT     Performed at [facility]   Report Status PENDING   Incomplete  CULTURE, BLOOD (ROUTINE X 2)     Status: None   Collection Time    09/12/12  4:00 AM      Result Value Range Status   Specimen Description BLOOD RIGHT HAND   Final   Special Requests BOTTLES DRAWN AEROBIC ONLY 5CC   Final   Culture  Setup Time     Final   Value: 09/12/2012 11:35     Performed at [facility]   Culture     Final   Value:        BLOOD CULTURE RECEIVED NO GROWTH TO DATE CULTURE WILL BE HELD FOR 5 DAYS BEFORE ISSUING A FINAL NEGATIVE REPORT     Performed at [facility]   Report Status PENDING   Incomplete  CULTURE, RESPIRATORY (NON-EXPECTORATED)     Status: None   Collection Time    09/12/12  8:15 AM  Result Value Range Status   Specimen Description TRACHEAL ASPIRATE   Final   Special Requests NONE   Final   Gram Stain     Final   Value: NO WBC SEEN     NO SQUAMOUS EPITHELIAL CELLS SEEN     RARE GRAM NEGATIVE COCCI     Performed at [facility]   Culture     Final   Value: Non-Pathogenic Oropharyngeal-type Flora Isolated.     Performed at [facility]   Report Status 09/14/2012 FINAL   Final  CLOSTRIDIUM DIFFICILE BY PCR     Status: None   Collection Time    09/14/12 10:52 PM      Result Value Range Status   C difficile by pcr NEGATIVE  NEGATIVE Final    Anti-infectives   Start     Dose/Rate Route Frequency Ordered Stop   09/12/12 1053  bacitracin 50,000 Units in sodium chloride irrigation 0.9 % 500 mL irrigation  Status:  Discontinued       As needed 09/12/12 1054 09/12/12 1212   09/12/12 0800  Ampicillin-Sulbactam (UNASYN) 3 g in sodium chloride 0.9 % 100 mL IVPB  Status:  Discontinued     3 g 100 mL/hr over 60 Minutes Intravenous Every 8  hours 09/12/12 0152 09/12/12 0746   09/12/12 0800  Ampicillin-Sulbactam (UNASYN) 3 g in sodium chloride 0.9 % 100 mL IVPB     3 g 100 mL/hr over 60 Minutes Intravenous 4 times per day 09/12/12 0746     09/12/12 0200  Ampicillin-Sulbactam (UNASYN) 3 g in sodium chloride 0.9 % 100 mL IVPB     3 g 100 mL/hr over 60 Minutes Intravenous  Once 09/12/12 0152 09/12/12 0336   09/11/12 2308  ceFAZolin (ANCEF) 1-5 GM-% IVPB    Comments:  Aydelette, Jamie   : cabinet override      09/11/12 2308 09/12/12 1114   09/11/12 1945  bacitracin 50,000 Units in sodium chloride irrigation 0.9 % 500 mL irrigation  Status:  Discontinued       As needed 09/11/12 2050 09/12/12 0030   09/11/12 1923  ceFAZolin (ANCEF) 2-3 GM-% IVPB SOLR    Comments:  Toney Sang   : cabinet override      09/11/12 1923 09/11/12 2310      Assessment: 26yof on Unasyn Day 4 for suspected aspiration PNA. Patient is currently afebrile, WBC slowly trending down and cultures have reported no significant results (Cdiff negative). Patient's renal function has remained stable. Patient is also receiving 3% saline - Na is remaining stable ~150.   Plan:  1. Continue Unasyn 3g IV q6h 2. Follow-up renal function, cultures, clinical course and LOT 3. Continue to monitor Na levels while on 3% saline  Cleon Dew [phone] 09/15/2012,11:02 AM

## 2012-09-15 NOTE — Progress Notes (Signed)
[facility] ADULT ICU REPLACEMENT PROTOCOL FOR AM LAB REPLACEMENT ONLY  The patient does apply for the [facility] Adult ICU Electrolyte Replacment Protocol based on the criteria listed below:   1. Is GFR >/= 40 ml/min? yes  Patient's GFR today is >90 2. Is urine output >/= 0.5 ml/kg/hr for the last 6 hours? yes Patient's UOP is 5.79 ml/kg/hr 3. Is BUN < 60 mg/dL? yes  Patient's BUN today is 9 4. Abnormal electrolyte(s): K+ 3.3 5. Ordered repletion with: see order 6. If a panic level lab has been reported, has the CCM MD in charge been notified? yes.   Physician:  Dr. Higinio Plan, Frona Yost A 09/15/2012 5:55 AM

## 2012-09-15 NOTE — Progress Notes (Signed)
UR completed.   Sukhdeep Wieting, RN BSN [facility] CCM  [phone] 

## 2012-09-15 NOTE — Progress Notes (Signed)
PULMONARY  / CRITICAL CARE MEDICINE  Name: Sally Nelson MRN: [id number] DOB: 12/16/1985    ADMISSION DATE:  09/11/2012 CONSULTATION DATE:  09/11/2012  REFERRING MD :  Conchita Paris PRIMARY SERVICE: PCCM  CHIEF COMPLAINT:  Post op vent management  BRIEF PATIENT DESCRIPTION: 27 y/o female underwent a craniotomy, clipping R MCA aneurysm for a SAH on 9/12 and PCCM was consulted for post op vent management.  SIGNIFICANT EVENTS / STUDIES:  9/12 craniotomy, clipping R MCA aneurysm for a SAH Head CT 9/12 8:45 > interval development R SDH, effacement basilar cisterns, evolving R MCA CVA, midline shift 14mm --> 3% saline  LINES / TUBES: 9/12 ETT >> 9/12 Lt Milligan >>  CULTURES: 9/12 respiratory culture >>ng 9/12 bld >> ng  ANTIBIOTICS: 9/12 unasyn >>  SUBJECTIVE:  Mental status much improved. Following commands.  No events overnight.  VITAL SIGNS: Temp:  [97.1 F (36.2 C)-99.4 F (37.4 C)] 98.8 F (37.1 C) (09/15 0800) Pulse Rate:  [71-99] 95 (09/15 1000) Resp:  [14-20] 14 (09/15 1000) BP: (108-136)/(49-81) 130/77 mmHg (09/15 1000) SpO2:  [97 %-100 %] 100 % (09/15 1000) Arterial Line BP: (144-170)/(52-79) 165/70 mmHg (09/14 2000) FiO2 (%):  [30 %] 30 % (09/15 0800) Weight:  [53.5 kg (117 lb 15.1 oz)] 53.5 kg (117 lb 15.1 oz) (09/15 0500) HEMODYNAMICS:   VENTILATOR SETTINGS: Vent Mode:  [-] PRVC FiO2 (%):  [30 %] 30 % Set Rate:  [14 bmp] 14 bmp Vt Set:  [500 mL] 500 mL PEEP:  [5 cmH20] 5 cmH20 Pressure Support:  [8 cmH20-10 cmH20] 10 cmH20 Plateau Pressure:  [17 cmH20-20 cmH20] 17 cmH20 INTAKE / OUTPUT: Intake/Output     09/14 0701 - 09/15 0700 09/15 0701 - 09/16 0700   I.V. (mL/kg) 1725 (32.2) 225 (4.2)   NG/GT 1150 150   IV Piggyback 605    Total Intake(mL/kg) 3480 (65) 375 (7)   Urine (mL/kg/hr) 4675 (3.6) 575 (2.6)   Total Output 4675 575   Net -1195 -200        Stool Occurrence 3 x     PHYSICAL EXAMINATION: Gen: young female, NAD on vent  HEENT: scalp dressing  in place, drain in place, ETT in place PULM: resps even non labored on PS 8/5, coarse, good air entry CV: RRR, loud diastolic/systolic murmur, no JVD AB: BS+, soft, nontender, no hsm Ext: warm, no edema, no clubbing, no cyanosis Derm: no rash or skin breakdown Neuro: awake, alert, appropriate, follows commands, waves when I enter room, minimal mvmt LUE, weak LLE, moves R side well.   LABS:  CBC Recent Labs     09/13/12  0500  09/14/12  0445  09/15/12  0430  WBC  16.1*  14.9*  14.2*  HGB  8.3*  8.2*  8.5*  HCT  24.0*  24.3*  25.4*  PLT  109*  119*  143*   Coag's No results found for this basename: APTT, INR,  in the last 72 hours BMET Recent Labs     09/13/12  0500   09/14/12  0445   09/15/12  0230  09/15/12  0430  09/15/12  0800  NA  147*   < >  148*   < >  149*  150*  151*  K  3.4*   --   3.2*   --    --   3.3*   --   CL  114*   --   117*   --    --   114*   --  CO2  23   --   24   --    --   26   --   BUN  8   --   8   --    --   9   --   CREATININE  0.43*   --   0.44*   --    --   0.45*   --   GLUCOSE  138*   --   122*   --    --   128*   --    < > = values in this interval not displayed.   Electrolytes Recent Labs     09/13/12  0500  09/14/12  0445  09/15/12  0430  CALCIUM  8.6  8.5  9.4  MG  2.2   --    --   PHOS  2.1*   --    --    Sepsis Markers No results found for this basename: LACTICACIDVEN, PROCALCITON, O2SATVEN,  in the last 72 hours ABG No results found for this basename: PHART, PCO2ART, PO2ART,  in the last 72 hours Liver Enzymes No results found for this basename: AST, ALT, ALKPHOS, BILITOT, ALBUMIN,  in the last 72 hours Cardiac Enzymes No results found for this basename: TROPONINI, PROBNP,  in the last 72 hours Glucose Recent Labs     09/14/12  1217  09/14/12  1616  09/14/12  2000  09/15/12  0012  09/15/12  0354  09/15/12  0752  GLUCAP  124*  99  104*  120*  121*  119*   Dg Chest Port 1 View  09/15/2012   *RADIOLOGY REPORT*   Clinical Data: Respiratory failure.  Ventilator.  PORTABLE CHEST - 1 VIEW  Comparison: 09/14/2012  Findings: Endotracheal tube, NG tube, and left central line appear unchanged.  Chronic prominence of the main pulmonary artery.  Significant improvement in the right middle lobe pneumonia.  Slight atelectasis at the left lung base medially.  No acute osseous abnormality.  IMPRESSION:  1.  Improving right middle lobe pneumonia. 2.  Slight to left base atelectasis, unchanged.   Original Report Authenticated By: Francene Boyers, M.D.   Dg Chest Portable 1 View  09/14/2012   *RADIOLOGY REPORT*  Clinical Data: Follow up respiratory failure  PORTABLE CHEST - 1 VIEW  Comparison: Prior chest x-ray 09/13/2012  Findings: The patient remains intubated.  The tip the endotracheal tube is 5.3 cm above the carina.  Left subclavian approach central venous catheter unchanged position with the tip in the mid SVC. Nasogastric tube is noted.  A portion is visualized coiled in the gastric fundus.  The tip lies off the field of view.  Persistent right middle lobe airspace infiltrate.  Cardiac and mediastinal contours remain unchanged.  No new airspace consolidation, pleural effusion or pneumothorax.  IMPRESSION:  1.  Stable and satisfactory support apparatus. 2.  Persistent but perhaps slightly improved right middle lobe infiltrate.   Original Report Authenticated By: Malachy Moan, M.D.   ASSESSMENT / PLAN:  NEUROLOGIC A:  SAH, s/p R MCA clipping c/b R MCA territory CVA, edema and effacement 9/14 mental status much improved P:   - Cont 3% saline per nsgy. - Nsgy following ventric and post op. - Cont keppra  - CT as ordered per NS.  PULMONARY A: Post op respiratory failure > good vent mechanics/oxygenation CAP vs aspiration pneumonia (favor aspiration) -improving on CXR Asthma> not in exacerbation P:   - PS wean as tol -  will need clearance from NS prior to serious consideration of extubation.  CARDIOVASCULAR A:  Loud heart murmur > 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 ? PDA vs VSD HD stable.  HTN. P:  - Tele. - BP control as ordered. - 2D echo pending.  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  Hypokalemia and hyponatremia. P:   - As per neuro section, cont 3% saline - F/u chem  - Replace K and Phos.  GASTROINTESTINAL A:  No acute issues P:   - Tol TF. - Pantoprazole for stress ulcer prophylaxis.  HEMATOLOGIC A:  Anemia P:  - Monitor h/h - Transfusion threshold < 7gm/dl  INFECTIOUS A:  Aspiration pneumonia P:   - Unasyn as ordered. - F/u cultures NTD.  ENDOCRINE A:  No acute issues P:   - Monitor glucose.  CRITICAL CARE: The patient is critically ill with multiple organ systems failure and requires high complexity decision making for assessment and support, frequent evaluation and titration of therapies, application of advanced monitoring technologies and extensive interpretation of multiple databases. Critical Care Time devoted to patient care services described in this note is 35 minutes.   *Care during the described time interval was provided by me and/or other providers on the critical care team. I have reviewed this patient's available data, including medical history, events of note, physical examination and test results as part of my evaluation.  Alyson Reedy, M.D. [facility] Pulmonary/Critical Care Medicine. Pager: [phone]. After hours pager: [phone].

## 2012-09-15 NOTE — Progress Notes (Signed)
Transcranial Doppler  Date POD PCO2 HCT BP  MCA ACA PCA OPHT SIPH VERT Basilar  09/15/12 MS    131/78 Right  Left   59  69   -68  -74   33  20   14  14    -24  46   -26  *     *         Right  Left                                            Right  Left                                             Right  Left                                             Right  Left                                            Right  Left                                            Right  Left                                        MCA = Middle Cerebral Artery      OPHT = Opthalmic Artery     BASILAR = Basilar Artery   ACA = Anterior Cerebral Artery     SIPH = Carotid Siphon PCA = Posterior Cerebral Artery   VERT = Verterbral Artery                   Normal MCA = 62+\-12 ACA = 50+\-12 PCA = 42+\-23   * Unable to insonate.  09/15/2012 3:30 PM Gertie Fey, RVT, RDCS, RDMS

## 2012-09-15 NOTE — Progress Notes (Signed)
Pt seen and examined. No issues overnight. Pt remains intubated, able to tolerate SBT yesterday.   EXAM: Temp:  [97.1 F (36.2 C)-99.4 F (37.4 C)] 98.8 F (37.1 C) (09/15 0800) Pulse Rate:  [71-99] 97 (09/15 0800) Resp:  [14-20] 14 (09/15 0800) BP: (108-141)/(43-117) 125/65 mmHg (09/15 0800) SpO2:  [97 %-100 %] 100 % (09/15 0800) Arterial Line BP: (144-170)/(52-79) 165/70 mmHg (09/14 2000) FiO2 (%):  [30 %] 30 % (09/15 0800) Weight:  [53.5 kg (117 lb 15.1 oz)] 53.5 kg (117 lb 15.1 oz) (09/15 0500)  Intake/Output     09/14 0701 - 09/15 0700 09/15 0701 - 09/16 0700   I.V. (mL/kg) 1725 (32.2) 75 (1.4)   NG/GT 1150 50   IV Piggyback 605    Total Intake(mL/kg) 3480 (65) 125 (2.3)   Urine (mL/kg/hr) 4675 (3.6) 250 (2.1)   Total Output 4675 250   Net -1195 -125        Stool Occurrence 3 x     EVD @ 10, 0cc output  Awake, alert Intubated, breathing over vent Pupils reactive OU Briskly Follows commands RUE, RLE Weakly w/d LUE, withdraws LLE Wound c/d/i  LABS: Lab Results  Component Value Date   CREATININE 0.45* 09/15/2012   BUN 9 09/15/2012   NA 151* 09/15/2012   K 3.3* 09/15/2012   CL 114* 09/15/2012   CO2 26 09/15/2012   Lab Results  Component Value Date   WBC 14.2* 09/15/2012   HGB 8.5* 09/15/2012   HCT 25.4* 09/15/2012   MCV 73.8* 09/15/2012   PLT 143* 09/15/2012    IMAGING: No new imaging overnight  IMPRESSION: - 27 y.o. female post-hemorrhage d# 4, s/p clipping RMCA aneurysm with subsequent edema requiring craniectomy - Significantly improved neurologic exam  PLAN: - Cont close neurologic observation - Non-contrast CTH today to assess EVD position/brain edema - Continue Nimotop, add Zocor and MWF TCD for spasm - No issues with BP, can allow SBP to rise up to - Can repeat SBT, but would not attempt extubation until after CT done - Cont electrolyte replacement, TF. Cont 3% Na at current rate, monitor Na with goal remaining 150-14mEq. - Cont Unasyn for  pneumonia, Cdiff PCR pending.

## 2012-09-16 ENCOUNTER — Inpatient Hospital Stay (HOSPITAL_COMMUNITY): Payer: Medicaid Other

## 2012-09-16 ENCOUNTER — Encounter (HOSPITAL_COMMUNITY): Payer: Self-pay | Admitting: Neurosurgery

## 2012-09-16 LAB — CBC
MCH: 24.7 pg — ABNORMAL LOW (ref 26.0–34.0)
MCHC: 33.5 g/dL (ref 30.0–36.0)
Platelets: 137 10*3/uL — ABNORMAL LOW (ref 150–400)

## 2012-09-16 LAB — BLOOD GAS, ARTERIAL
Bicarbonate: 26 mEq/L — ABNORMAL HIGH (ref 20.0–24.0)
PEEP: 5 cmH2O
Patient temperature: 98.6
pH, Arterial: 7.445 (ref 7.350–7.450)
pO2, Arterial: 162 mmHg — ABNORMAL HIGH (ref 80.0–100.0)

## 2012-09-16 LAB — BASIC METABOLIC PANEL
BUN: 12 mg/dL (ref 6–23)
Calcium: 9.2 mg/dL (ref 8.4–10.5)
GFR calc non Af Amer: >90 mL/min (ref >90)
Glucose, Bld: 136 mg/dL — ABNORMAL HIGH (ref 70–99)

## 2012-09-16 LAB — GLUCOSE, CAPILLARY
Glucose-Capillary: 117 mg/dL — ABNORMAL HIGH (ref 70–99)
Glucose-Capillary: 109 mg/dL — ABNORMAL HIGH (ref 70–99)
Glucose-Capillary: 129 mg/dL — ABNORMAL HIGH (ref 70–99)
Glucose-Capillary: 113 mg/dL — ABNORMAL HIGH (ref 70–99)

## 2012-09-16 LAB — MAGNESIUM: Magnesium: 2 mg/dL (ref 1.5–2.5)

## 2012-09-16 LAB — SODIUM: Sodium: 153 mEq/L — ABNORMAL HIGH (ref 135–145)

## 2012-09-16 MED ORDER — POTASSIUM CHLORIDE 20 MEQ/15ML (10%) PO LIQD
40.0000 meq | Freq: Three times a day (TID) | ORAL | Status: AC
  Administered 2012-09-16 (×3): 40 meq
  Filled 2012-09-16 (×3): qty 30

## 2012-09-16 MED ORDER — POTASSIUM CHLORIDE 20 MEQ/15ML (10%) PO LIQD
ORAL | Status: AC
  Filled 2012-09-16: qty 15

## 2012-09-16 MED ORDER — HEPARIN SODIUM (PORCINE) 5000 UNIT/ML IJ SOLN
5000.0000 [IU] | Freq: Three times a day (TID) | INTRAMUSCULAR | Status: DC
  Administered 2012-09-16 – 2012-10-08 (×64): 5000 [IU] via SUBCUTANEOUS
  Filled 2012-09-16 (×72): qty 1

## 2012-09-16 NOTE — Progress Notes (Signed)
NUTRITION FOLLOW UP  Intervention:    1. Initiate Vital AF 1.2 @ 20 ml/hr via OG tube and increase by 10 ml every 4 hours to goal rate of 50 ml/hr.   At goal rate, tube feeding regimen will provide 1440 kcal, 90 grams of protein, and 973 ml of H2O.    Nutrition Dx:   Inadequate oral intake related to inability to eat as evidenced by NPO status; ongoing.    Goal:  Intake to meet >90% of estimated nutrition needs, not yet met.   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  Patient is currently intubated on ventilator support.  MV: 6.5 L/min Temp:Temp (24hrs), Avg:98.8 F (37.1 C), Min:97.4 F (36.3 C), Max:100.6 F (38.1 C) Potassium low and being repleted. Pt on 3% IVF with close monitoring of Na.   Patient has OG tube in place. Vital AF 1.2 is infusing @ 50 ml/hr.  Tube feeding regimen currently providing 1440 kcal, 90 grams protein, and 973 ml H2O.   Residuals: 0      Height: Ht Readings from Last 1 Encounters:  09/11/12 5\' 1"  (1.549 m)    Weight Status:   Wt Readings from Last 1 Encounters:  09/16/12 121 lb 4.1 oz (55 kg)  Admission weight: 118 lb 9/11  Re-estimated needs:  Kcal: 1374 Protein: 82-100 grams  Fluid: >1.5 L/day  Skin: head and abd incision  Diet Order: NPO   Intake/Output Summary (Last 24 hours) at 09/16/12 0846 Last data filed at 09/16/12 0500  Gross per 24 hour  Intake   4235 ml  Output   3082 ml  Net   1153 ml    Last BM: via rectal pouch which was inserted 9/14 Intake not well documented. It appears that pt has had about 800 ml out x 38 hrs.   Labs:   Recent Labs Lab 09/13/12 0145 09/13/12 0500  09/14/12 0445  09/15/12 0430  09/15/12 2000 09/16/12 0200 09/16/12 0509  NA 146* 147*  < > 148*  < > 150*  < > 152* 151* 152*  K  --  3.4*  --  3.2*  --  3.3*  --   --  3.3*  --   CL  --  114*  --  117*  --  114*  --    --  115*  --   CO2  --  23  --  24  --  26  --   --  25  --   BUN  --  8  --  8  --  9  --   --  12  --   CREATININE  --  0.43*  --  0.44*  --  0.45*  --   --  0.48*  --   CALCIUM  --  8.6  --  8.5  --  9.4  --   --  9.2  --   MG  --  2.2  --   --   --   --   --   --  2.0  --   PHOS  --  2.1*  --   --   --   --   --   --  3.7  --   GLUCOSE  --  138*  --  122*  --  128*  --   --  136*  --   < > = values  in this interval not displayed.  CBG (last 3)   Recent Labs  09/15/12 2345 09/16/12 0341 09/16/12 0815  GLUCAP 124* 117* 109*    Scheduled Meds: . ampicillin-sulbactam (UNASYN) IV  3 g Intravenous Q6H  . antiseptic oral rinse  15 mL Mouth Rinse QID  . chlorhexidine  15 mL Mouth Rinse BID  . feeding supplement (VITAL AF 1.2 CAL)  1,000 mL Per Tube Q24H  . indocyanine green  25 mg Intravenous Once  . levETIRAcetam  500 mg Intravenous Q12H  . NiMODipine  60 mg Per Tube Q4H   Or  . niMODipine  60 mg Oral Q4H  . pantoprazole (PROTONIX) IV  40 mg Intravenous QHS  . papaverine  60 mg Intravenous Once  . senna-docusate  1 tablet Oral BID  . simvastatin  80 mg Oral QHS    Continuous Infusions: . sodium chloride 20 mL/hr at 09/16/12 0031  . sodium chloride (hypertonic) 75 mL/hr at 09/16/12 0341    Kendell Bane RD, LDN, CNSC [phone] Pager [phone] After Hours Pager

## 2012-09-16 NOTE — Progress Notes (Signed)
Pt seen and examined. No issues overnight. Pt remains intubated, tolerated short SBT trial yesterday.  EXAM: Temp:  [97.4 F (36.3 C)-100.6 F (38.1 C)] 98.5 F (36.9 C) (09/16 0816) Pulse Rate:  [70-106] 102 (09/16 1000) Resp:  [14-18] 18 (09/16 1000) BP: (115-135)/(54-90) 128/70 mmHg (09/16 1000) SpO2:  [99 %-100 %] 100 % (09/16 1000) FiO2 (%):  [30 %] 30 % (09/16 0900) Weight:  [55 kg (121 lb 4.1 oz)] 55 kg (121 lb 4.1 oz) (09/16 0459)  Intake/Output     09/15 0701 - 09/16 0700 09/16 0701 - 09/17 0700   I.V. (mL/kg) 2260 (41.1) 285 (5.2)   NG/GT 1200 150   IV Piggyback 1285    Total Intake(mL/kg) 4745 (86.3) 435 (7.9)   Urine (mL/kg/hr) 3330 (2.5) 550 (2.5)   Drains 2 (0) 5 (0)   Total Output 3332 555   Net +1413 -120         EVD - 5cc output this am  Somnolent but easily arousable. Pupils reactive Intubated, breathing over vent Follows commands RUE/RLE Withdraws LLE, weakly w/d LUE Wound c/d/i  LABS: Lab Results  Component Value Date   CREATININE 0.48* 09/16/2012   BUN 12 09/16/2012   NA 152* 09/16/2012   K 3.3* 09/16/2012   CL 115* 09/16/2012   CO2 25 09/16/2012   Lab Results  Component Value Date   WBC 13.6* 09/16/2012   HGB 8.2* 09/16/2012   HCT 24.5* 09/16/2012   MCV 73.8* 09/16/2012   PLT 137* 09/16/2012    IMAGING: TCD yesterday reviewed, not indicative of developing spasm CTH yesterday reviewed, evolution of right MCA infarct, improvement in MLS, effacement of right lat vent, collapsed around EVD cath tip  IMPRESSION: - 27 y.o. female SAH d# 5 , s/p RMCA aneurysm clipping and subsequent hemicraniectomy - Neurologically stable - VDRF  PLAN: - Cont spasm prophylaxis and MWF TCD - Cont 3% Na with goal 150-155. Can begin to wean tomorrow. - No SBP issues, can allow to autoregulate - Cont vent mgmt per CCM, cont Unasyn - Cont TF and electrolyte replacement per protocol - GI prophylaxis, can start DVT prophylaxis - heparin SQ 5000U Q8hrs.

## 2012-09-16 NOTE — Progress Notes (Signed)
eLink Physician-Brief Progress Note Patient Name: Sally Nelson DOB: [birth year] MRN: [id number]  Date of Service  09/16/2012   HPI/Events of Note   Left arm risk  eICU Interventions  reastrain   Intervention Category Minor Interventions: Clinical assessment - ordering diagnostic tests  Nelda Bucks. 09/16/2012, 7:59 PM

## 2012-09-16 NOTE — Progress Notes (Signed)
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 Following commands.  No events overnight.  VITAL SIGNS: Temp:  [97.4 F (36.3 C)-100.6 F (38.1 C)] 98.5 F (36.9 C) (09/16 0816) Pulse Rate:  [70-106] 102 (09/16 1000) Resp:  [14-18] 18 (09/16 1000) BP: (115-135)/(54-90) 128/70 mmHg (09/16 1000) SpO2:  [99 %-100 %] 100 % (09/16 1000) FiO2 (%):  [30 %] 30 % (09/16 0900) Weight:  [55 kg (121 lb 4.1 oz)] 55 kg (121 lb 4.1 oz) (09/16 0459) HEMODYNAMICS:   VENTILATOR SETTINGS: Vent Mode:  [-] CPAP;PSV FiO2 (%):  [30 %] 30 % Set Rate:  [14 bmp] 14 bmp Vt Set:  [500 mL] 500 mL PEEP:  [5 cmH20] 5 cmH20 Pressure Support:  [10 cmH20] 10 cmH20 Plateau Pressure:  [16 cmH20-20 cmH20] 19 cmH20 INTAKE / OUTPUT: Intake/Output     09/15 0701 - 09/16 0700 09/16 0701 - 09/17 0700   I.V. (mL/kg) 2260 (41.1) 285 (5.2)   NG/GT 1200 150   IV Piggyback 1285    Total Intake(mL/kg) 4745 (86.3) 435 (7.9)   Urine (mL/kg/hr) 3330 (2.5) 550 (2.6)   Drains 2 (0) 5 (0)   Total Output 3332 555   Net +1413 -120         PHYSICAL EXAMINATION: Gen: young female, NAD on vent  HEENT: scalp dressing in place, drain in place, ETT in place PULM: resps even non  labored on PS 8/5, coarse, good air entry CV: RRR, loud diastolic/systolic murmur, no JVD AB: BS+, soft, nontender, no hsm Ext: warm, no edema, no clubbing, no cyanosis Derm: no rash or skin breakdown Neuro: awake, alert, appropriate, follows commands, waves when I enter room, minimal mvmt LUE, weak LLE, moves R side well.   LABS:  CBC Recent Labs     09/14/12  0445  09/15/12  0430  09/16/12  0200  WBC  14.9*  14.2*  13.6*  HGB  8.2*  8.5*  8.2*  HCT  24.3*  25.4*  24.5*  PLT  119*  143*  137*   Coag's No results found for this basename: APTT, INR,  in the last 72 hours BMET Recent Labs     09/14/12  0445   09/15/12  0430   09/15/12  2000  09/16/12  0200  09/16/12  0509  NA  148*   < >  150*   < >  152*  151*  152*  K  3.2*   --   3.3*   --    --   3.3*   --   CL  117*   --   114*   --    --   115*   --   CO2  24   --  26   --    --   25   --   BUN  8   --   9   --    --   12   --   CREATININE  0.44*   --   0.45*   --    --   0.48*   --   GLUCOSE  122*   --   128*   --    --   136*   --    < > = values in this interval not displayed.   Electrolytes Recent Labs     09/14/12  0445  09/15/12  0430  09/16/12  0200  CALCIUM  8.5  9.4  9.2  MG   --    --   2.0  PHOS   --    --   3.7   Sepsis Markers No results found for this basename: LACTICACIDVEN, PROCALCITON, O2SATVEN,  in the last 72 hours ABG Recent Labs     09/16/12  0443  PHART  7.445  PCO2ART  38.5  PO2ART  162.0*   Liver Enzymes No results found for this basename: AST, ALT, ALKPHOS, BILITOT, ALBUMIN,  in the last 72 hours Cardiac Enzymes No results found for this basename: TROPONINI, PROBNP,  in the last 72 hours Glucose Recent Labs     09/15/12  1158  09/15/12  1546  09/15/12  1943  09/15/12  2345  09/16/12  0341  09/16/12  0815  GLUCAP  104*  104*  112*  124*  117*  109*   Ct Head Wo Contrast  09/15/2012   *RADIOLOGY REPORT*  Clinical Data: Right MCA aneurysm.  Follow-up infarct and  hemorrhage  CT HEAD WITHOUT CONTRAST  Technique:  Contiguous axial images were obtained from the base of the skull through the vertex without contrast.  Comparison: CT 09/12/2012  Findings: Interval craniectomy on the right.  Large area of acute infarct in the right MCA territory involving the frontal temporal and parietal lobes.  Progressive low density edema is seen in this area.  The basal ganglia is spared. Brain extends through the craniectomy defect due to edema.  There is improvement of midline shift which now measures 6 mm.  This is attributed to craniectomy.  Aneurysm clip remains in the region of the right MCA bifurcation.  High density hemorrhage in the right sylvian fissure unchanged. Right-sided subdural hematoma has improved.  There remains a small amount of right frontal and interhemispheric subdural hemorrhage. There is a small amount of hemorrhage in the left occipital horn, unchanged.  Right frontal ventricular shunt catheter in the right frontal horn unchanged.  Ventricles are not dilated.  No new area of acute infarction is identified.  IMPRESSION: Large area of right MCA infarction with diffuse cerebral edema. Midline shift has improved and now measures 6 mm following craniectomy.  Subarachnoid and subdural hemorrhage and intraventricular hemorrhage is stable.   Original Report Authenticated By: Janeece Riggers, M.D.   Dg Chest Port 1 View  09/16/2012   *RADIOLOGY REPORT*  Clinical Data: Evaluate endotracheal tube positioning  PORTABLE CHEST - 1 VIEW  Comparison: 09/15/2012; 09/14/2012; 09/13/2012  Findings: Grossly unchanged enlarged cardiac silhouette and mediastinal contours.  Stable position of support apparatus.  No pneumothorax.  Improved aeration of the right infrahilar lung.  No new focal airspace opacities.  No evidence of edema.  No definite pleural effusion.  Unchanged bones.  IMPRESSION: 1.  Stable positioning of support apparatus.  No pneumothorax. 2.  Improved aeration of right  infrahilar opacities suggests resolving atelectasis and/or infection.  3.  Cardiomegaly without evidence of edema.   Original Report Authenticated By: Tacey Ruiz, MD   Dg Chest Port 1 View  09/15/2012   *RADIOLOGY REPORT*  Clinical Data: Respiratory failure.  Ventilator.  PORTABLE CHEST - 1 VIEW  Comparison: 09/14/2012  Findings: Endotracheal tube, NG tube, and left central line appear unchanged.  Chronic prominence of the main pulmonary artery.  Significant improvement in the right middle lobe pneumonia.  Slight atelectasis at the left lung base medially.  No acute osseous abnormality.  IMPRESSION:  1.  Improving right middle lobe pneumonia. 2.  Slight to left base atelectasis, unchanged.   Original Report Authenticated By: Francene Boyers, M.D.   ASSESSMENT / PLAN:  NEUROLOGIC A:  SAH, s/p R MCA clipping c/b R MCA territory CVA, edema and effacement 9/14 mental status much improved.  Weaning relatively ok. Spoke with father extensively today, informed him that the patient will likely need trach but will need to speak with NS prior to proceeding further with either extubation or trach/peg to determine prognosis.  Father is ok with whatever recommendations we make. P:   - Cont 3% saline per nsgy. - Nsgy following ventric and post op. - Cont keppra  - CT as ordered per NS and noted. - Begin PS trials, no extubation due to mental status.  PULMONARY A: Post op respiratory failure > good vent mechanics/oxygenation CAP vs aspiration pneumonia (favor aspiration) -improving on CXR Asthma> not in exacerbation P:   - PS wean as tol - will need clearance from NS prior to serious consideration of extubation.  CARDIOVASCULAR A: Loud heart murmur > 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 ? PDA vs VSD HD stable.  HTN. P:  - Tele. - BP control as ordered. - Echo with grade 2 diastolic dysfunction, thickening and EF of 60-65%.  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   Hypokalemia and hyponatremia. P:   - As per neuro section, cont 3% saline - F/u chem  - Replace K.  GASTROINTESTINAL A:  No acute issues P:   - Tol TF. - Pantoprazole for stress ulcer prophylaxis.  HEMATOLOGIC A:  Anemia P:  - Monitor h/h - Transfusion threshold < 7gm/dl  INFECTIOUS A:  Aspiration pneumonia P:   - Unasyn as ordered. - F/u cultures NTD.  ENDOCRINE A:  No acute issues P:   - Monitor glucose.  CRITICAL CARE: The patient is critically ill with multiple organ systems failure and requires high complexity decision making for assessment and support, frequent evaluation and titration of therapies, application of advanced monitoring technologies and extensive interpretation of multiple databases. Critical Care Time devoted to patient care services described in this note is 35 minutes.   *Care during the described time interval was provided by me and/or other providers on the critical care team. I have reviewed this patient's available data, including medical history, events of note, physical examination and test results as part of my evaluation.  Alyson Reedy, M.D. [facility] Pulmonary/Critical Care Medicine. Pager: [phone]. After hours pager: [phone].

## 2012-09-17 ENCOUNTER — Inpatient Hospital Stay (HOSPITAL_COMMUNITY): Payer: Medicaid Other

## 2012-09-17 LAB — CBC
HCT: 24.6 % — ABNORMAL LOW (ref 36.0–46.0)
MCHC: 33.3 g/dL (ref 30.0–36.0)
MCV: 75.5 fL — ABNORMAL LOW (ref 78.0–100.0)
RDW: 18.8 % — ABNORMAL HIGH (ref 11.5–15.5)

## 2012-09-17 LAB — BLOOD GAS, ARTERIAL
PEEP: 5 cmH2O
RATE: 14 resp/min
pCO2 arterial: 41.3 mmHg (ref 35.0–45.0)
pO2, Arterial: 147 mmHg — ABNORMAL HIGH (ref 80.0–100.0)

## 2012-09-17 LAB — BASIC METABOLIC PANEL
BUN: 15 mg/dL (ref 6–23)
Creatinine, Ser: 0.55 mg/dL (ref 0.50–1.10)
GFR calc Af Amer: >90 mL/min (ref >90)
GFR calc non Af Amer: >90 mL/min (ref >90)

## 2012-09-17 LAB — GLUCOSE, CAPILLARY
Glucose-Capillary: 136 mg/dL — ABNORMAL HIGH (ref 70–99)
Glucose-Capillary: 122 mg/dL — ABNORMAL HIGH (ref 70–99)
Glucose-Capillary: 112 mg/dL — ABNORMAL HIGH (ref 70–99)

## 2012-09-17 LAB — SODIUM
Sodium: 151 mEq/L — ABNORMAL HIGH (ref 135–145)
Sodium: 155 mEq/L — ABNORMAL HIGH (ref 135–145)

## 2012-09-17 MED ORDER — MIDAZOLAM HCL 2 MG/2ML IJ SOLN
2.0000 mg | Freq: Once | INTRAMUSCULAR | Status: AC
  Administered 2012-09-17: 2 mg via INTRAVENOUS

## 2012-09-17 MED ORDER — MIDAZOLAM HCL 2 MG/2ML IJ SOLN
INTRAMUSCULAR | Status: AC
  Filled 2012-09-17: qty 2

## 2012-09-17 MED ORDER — FENTANYL CITRATE 0.05 MG/ML IJ SOLN
50.0000 ug | Freq: Once | INTRAMUSCULAR | Status: AC
  Administered 2012-09-17: 50 ug via INTRAVENOUS

## 2012-09-17 NOTE — Progress Notes (Signed)
Spoke with Dr. Conchita Paris about pt's drain status.  Emptied at 1400.  Drain seemingly clogged after this.  Called M.D. And he said to wait until 3pm and if it is still not draining to call and get another head CT.  Waiting on portable head CT at this time.  OK'd by Dr. Conchita Paris.  Pt. Now following commands again and arousable.  Drain put out 7cc this hour.  Spoke with Dr. Conchita Paris again around 1600 and he is glad pt. Has improved some.

## 2012-09-17 NOTE — Progress Notes (Addendum)
Pt seen and examined post- left frontal EVD placement. RN noted pt to be [facility] after EVD placement. No hemodynamic issues since placement.  Temp:  [98.6 F (37 C)-101.3 F (38.5 C)] 101.3 F (38.5 C) (09/17 1558) Pulse Rate:  [78-123] 100 (09/17 1700) Resp:  [14-22] 15 (09/17 1700) BP: (109-164)/(52-94) 121/77 mmHg (09/17 1700) SpO2:  [100 %] 100 % (09/17 1700) FiO2 (%):  [30 %] 30 % (09/17 1500) Weight:  [56.7 kg (125 lb)] 56.7 kg (125 lb) (09/17 0500)  Intake/Output     09/16 0701 - 09/17 0700 09/17 0701 - 09/18 0700   I.V. (mL/kg) 2280 (40.2) 550 (9.7)   Other 170    NG/GT 1200 405.8   IV Piggyback 610 205   Total Intake(mL/kg) 4260 (75.1) 1160.8 (20.5)   Urine (mL/kg/hr) 4000 (2.9) 415 (0.7)   Drains 5 (0) 34 (0.1)   Stool 1100 (0.8)    Total Output 5105 449   Net -845 +711.8         EVD @ 15, drained 7cc then 1cc since placement  Arousable to noxious stimuli Pupils ~38mm, sluggishly reactive Intermittently FC RUE  IMAGING: Portable CTH reviewed demonstrating good catheter position in L lat ventricle, tip is in ventricular clot. Improvement in HCP.  IMPRESSION: - 27 y.o. woman s/p RMCA aneurysm clipping with IVH post right frontal EVD removal and subsequent L EVD placement - Slightly improved after left EVD  PLAN: - Cont EVD open at 15 - Repeat pan-culture for fever.

## 2012-09-17 NOTE — Progress Notes (Signed)
Pt.'s HR went down to 44 in CT scan.  HR came back up fairly quickly.  Verbally told Dr. Conchita Paris about this event.  No other episodes of this at this time.  Will continue to monitor.

## 2012-09-17 NOTE — Progress Notes (Signed)
Pt seen and examined. No issues overnight. Pt remains intubated, did better with SBT yesterday.  EXAM: Temp:  [98.6 F (37 C)-100.2 F (37.9 C)] 98.6 F (37 C) (09/17 0800) Pulse Rate:  [79-121] 100 (09/17 0800) Resp:  [14-19] 17 (09/17 0800) BP: (115-144)/(55-89) 136/84 mmHg (09/17 0800) SpO2:  [100 %] 100 % (09/17 0800) FiO2 (%):  [30 %] 30 % (09/17 0800) Weight:  [56.7 kg (125 lb)] 56.7 kg (125 lb) (09/17 0500)  Intake/Output     09/16 0701 - 09/17 0700 09/17 0701 - 09/18 0700   I.V. (mL/kg) 2280 (40.2) 95 (1.7)   Other 170    NG/GT 1200 80   IV Piggyback 610    Total Intake(mL/kg) 4260 (75.1) 175 (3.1)   Urine (mL/kg/hr) 4000 (2.9) 275 (1.5)   Drains 5 (0)    Stool 1100 (0.8)    Total Output 5105 275   Net -845 -100         EVD - No output x24hrs  Awake, alert Pupils reactive Intubated, breathing over vent Follows commands RUE/RLE Follows commands LLE, w/d LUE Wound c/d/i  LABS: Lab Results  Component Value Date   CREATININE 0.55 09/17/2012   BUN 15 09/17/2012   NA 151* 09/17/2012   K 4.3 09/17/2012   CL 120* 09/17/2012   CO2 27 09/17/2012   Lab Results  Component Value Date   WBC 13.9* 09/17/2012   HGB 8.2* 09/17/2012   HCT 24.6* 09/17/2012   MCV 75.5* 09/17/2012   PLT 153 09/17/2012    IMAGING: No new imaging yesterday  IMPRESSION: - 27 y.o. female SAH d# 6 , s/p RMCA aneurysm clipping and subsequent hemicraniectomy - Neurologically stable/slightly improved from yesterday - VDRF  PLAN: - Cont spasm prophylaxis and MWF TCD - Now 5d post MCA stroke, near end of peak time for edema. Decrease 3%Na to 40cc/hr - EVD d/c'ed this am - No SBP issues, can allow to autoregulate - Cont vent mgmt per CCM, cont Unasyn. Can cont to attempt SBT, should tolerate extubation from neurologic standpoint. - Cont TF and electrolyte replacement per protocol - GI / DVT prophylaxis

## 2012-09-17 NOTE — Procedures (Signed)
PREOP DX: 1. Intraventricular hemorrhage, 2. Hydrocephalus  POSTOP DX: Same  PROCEDURE: Left frontal ventriculostomy   SURGEON: Dr. Lisbeth Renshaw, MD  ANESTHESIA: IV Sedation with Local  EBL: Minimal  SPECIMENS: None  COMPLICATIONS: None  CONDITION: Hemodynamically stable  INDICATIONS: [name] is a 27 y.o. female who was admitted to the intensive care unit with subarachnoid hemorrhage to do a right middle cerebral artery aneurysm.   She was found to have a decrease in mental status and CT scan demonstrated ventricular hemorrhage with hydrocephalus after removal of a previously inserted the ventricular catheter.  PROCEDURE IN DETAIL: After consent was obtained from the patient's family, skin of the left frontal scalp was clipped, prepped and draped in the usual sterile fashion.  Scalp was then infiltrated with local anesthetic with epinephrine.  Skin incision was made sharply, and twist drill burr hole was made.  The dura was then incised, and the ventricular catheter was passed in one attempt into the left lateral ventricle.  Good CSF flow was obtained.  The catheter was then tunneled subcutaneously and connected to a drainage system and the skin incision closed.  The drain was then secured in place.  FINDINGS: 1. Opening pressure >15cmH2O 2. Clear CSF

## 2012-09-17 NOTE — Progress Notes (Signed)
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 Following commands.  No events overnight.  VITAL SIGNS: Temp:  [98.6 F (37 C)-100.2 F (37.9 C)] 98.6 F (37 C) (09/17 0800) Pulse Rate:  [79-121] 94 (09/17 1100) Resp:  [14-22] 22 (09/17 1100) BP: (109-146)/(52-89) 146/87 mmHg (09/17 1100) SpO2:  [100 %] 100 % (09/17 1100) FiO2 (%):  [30 %] 30 % (09/17 0800) Weight:  [56.7 kg (125 lb)] 56.7 kg (125 lb) (09/17 0500) HEMODYNAMICS:   VENTILATOR SETTINGS: Vent Mode:  [-] PSV;CPAP FiO2 (%):  [30 %] 30 % Set Rate:  [14 bmp] 14 bmp Vt Set:  [500 mL] 500 mL PEEP:  [5 cmH20] 5 cmH20 Pressure Support:  [5 cmH20] 5 cmH20 Plateau Pressure:  [18 cmH20-22 cmH20] 19 cmH20 INTAKE / OUTPUT: Intake/Output     09/16 0701 - 09/17 0700 09/17 0701 - 09/18 0700   I.V. (mL/kg) 2280 (40.2) 310 (5.5)   Other 170    NG/GT 1200 230   IV Piggyback 610    Total Intake(mL/kg) 4260 (75.1) 540 (9.5)   Urine (mL/kg/hr) 4000 (2.9) 275 (1)   Drains 5 (0)    Stool 1100 (0.8)    Total Output 5105 275   Net -845 +265         PHYSICAL EXAMINATION: Gen: young female, NAD on vent  HEENT: scalp dressing in place, drain in place, ETT in place PULM: resps even non  labored on PS 8/5, coarse, good air entry CV: RRR, loud diastolic/systolic murmur, no JVD AB: BS+, soft, nontender, no hsm Ext: warm, no edema, no clubbing, no cyanosis Derm: no rash or skin breakdown Neuro: awake, alert, appropriate, follows commands, waves when I enter room, minimal mvmt LUE, weak LLE, moves R side well.   LABS:  CBC Recent Labs     09/15/12  0430  09/16/12  0200  09/17/12  0200  WBC  14.2*  13.6*  13.9*  HGB  8.5*  8.2*  8.2*  HCT  25.4*  24.5*  24.6*  PLT  143*  137*  153   Coag's No results found for this basename: APTT, INR,  in the last 72 hours BMET Recent Labs     09/15/12  0430   09/16/12  0200   09/16/12  2000  09/17/12  0200  09/17/12  0830  NA  150*   < >  151*   < >  153*  154*  151*  K  3.3*   --   3.3*   --    --   4.3   --   CL  114*   --   115*   --    --   120*   --  CO2  26   --   25   --    --   27   --   BUN  9   --   12   --    --   15   --   CREATININE  0.45*   --   0.48*   --    --   0.55   --   GLUCOSE  128*   --   136*   --    --   140*   --    < > = values in this interval not displayed.   Electrolytes Recent Labs     09/15/12  0430  09/16/12  0200  09/17/12  0200  CALCIUM  9.4  9.2  10.0  MG   --   2.0  2.3  PHOS   --   3.7  3.3   Sepsis Markers No results found for this basename: LACTICACIDVEN, PROCALCITON, O2SATVEN,  in the last 72 hours ABG Recent Labs     09/16/12  0443  09/17/12  0357  PHART  7.445  7.414  PCO2ART  38.5  41.3  PO2ART  162.0*  147.0*   Liver Enzymes No results found for this basename: AST, ALT, ALKPHOS, BILITOT, ALBUMIN,  in the last 72 hours Cardiac Enzymes No results found for this basename: TROPONINI, PROBNP,  in the last 72 hours Glucose Recent Labs     09/16/12  1205  09/16/12  1601  09/16/12  2021  09/17/12  0025  09/17/12  0355  09/17/12  0827  GLUCAP  129*  103*  113*  108*  104*  136*   Dg Chest Port 1 View  09/17/2012   *RADIOLOGY REPORT*  Clinical Data: Evaluate  endotracheal tube placement  PORTABLE CHEST - 1 VIEW  Comparison: Portable chest x-ray of 09/16/2012  Findings: The tip of the endotracheal tube is approximately 4.1 cm above the carina.  The lungs appear well aerated.  No infiltrate or effusion is seen.  A left central venous line is noted with the tip overlying the mid upper SVC.  IMPRESSION:  1.  Tip of endotracheal tube approximately 1.1 cm above the carina. 2.  The lungs are clear. 3.  Left central venous line tip overlies the mid upper SVC.   Original Report Authenticated By: Dwyane Dee, M.D.   Dg Chest Port 1 View  09/16/2012   *RADIOLOGY REPORT*  Clinical Data: Evaluate endotracheal tube positioning  PORTABLE CHEST - 1 VIEW  Comparison: 09/15/2012; 09/14/2012; 09/13/2012  Findings: Grossly unchanged enlarged cardiac silhouette and mediastinal contours.  Stable position of support apparatus.  No pneumothorax.  Improved aeration of the right infrahilar lung.  No new focal airspace opacities.  No evidence of edema.  No definite pleural effusion.  Unchanged bones.  IMPRESSION: 1.  Stable positioning of support apparatus.  No pneumothorax. 2.  Improved aeration of right infrahilar opacities suggests resolving atelectasis and/or infection.  3.  Cardiomegaly without evidence of edema.   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  Weaning relatively ok. Spoke with father extensively today, informed him that the patient will likely need trach but will need to speak with NS prior to proceeding further with either extubation or trach/peg to determine prognosis.  Father is ok with whatever recommendations we make. P:   -  Cont 3% saline per nsgy. - Nsgy following ventric and post op. - Cont keppra  - CT as ordered per NS and noted. - Begin PS trials, no extubation due to mental status.  PULMONARY A: Post  op respiratory failure > good vent mechanics/oxygenation CAP vs aspiration pneumonia (favor aspiration) -improving on CXR Asthma> not in exacerbation P:   - PS wean as tol - will need clearance from NS prior to serious consideration of extubation.  CARDIOVASCULAR A: Loud heart murmur > 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 ? PDA vs VSD HD stable.  HTN. P:  - Tele. - BP control as ordered. - Echo with grade 2 diastolic dysfunction, thickening and EF of 60-65%.  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  Hypokalemia and hyponatremia. P:   - As per neuro section, cont 3% saline - F/u chem  - Replace K.  GASTROINTESTINAL A:  No acute issues P:   - Tol TF. - Pantoprazole for stress ulcer prophylaxis.  HEMATOLOGIC A:  Anemia P:  - Monitor h/h - Transfusion threshold < 7gm/dl  INFECTIOUS A:  Aspiration pneumonia P:   - Unasyn as ordered. - F/u cultures NTD.  ENDOCRINE A:  No acute issues P:   - Monitor glucose.  CRITICAL CARE: The patient is critically ill with multiple organ systems failure and requires high complexity decision making for assessment and support, frequent evaluation and titration of therapies, application of advanced monitoring technologies and extensive interpretation of multiple databases. Critical Care Time devoted to patient care services described in this note is 35 minutes.   *Care during the described time interval was provided by me and/or other providers on the critical care team. I have reviewed this patient's available data, including medical history, events of note, physical examination and test results as part of my evaluation.  Alyson Reedy, M.D. [facility] Pulmonary/Critical Care Medicine. Pager: [phone]. After hours pager: [phone].

## 2012-09-17 NOTE — Progress Notes (Signed)
Transcranial Doppler  Date POD PCO2 HCT BP  MCA ACA PCA OPHT SIPH VERT Basilar  09/15/12 MS    131/78 Right  Left   59  69   -68  -74   33  20   14  14    -24  46   -26  *     *    09/17/12 MS     Right  Left   42  35   -22  -25   25  29   21  19   26   -40   -21  -25     -19         Right  Left                                             Right  Left                                             Right  Left                                            Right  Left                                            Right  Left                                        MCA = Middle Cerebral Artery      OPHT = Opthalmic Artery     BASILAR = Basilar Artery   ACA = Anterior Cerebral Artery     SIPH = Carotid Siphon PCA = Posterior Cerebral Artery   VERT = Verterbral Artery                   Normal MCA = 62+\-12 ACA = 50+\-12 PCA = 42+\-23   * Unable to insonate.  09/17/2012 3:53 PM Gertie Fey, RVT, RDCS, RDMS

## 2012-09-18 ENCOUNTER — Inpatient Hospital Stay (HOSPITAL_COMMUNITY): Payer: Medicaid Other

## 2012-09-18 LAB — MAGNESIUM: Magnesium: 2.4 mg/dL (ref 1.5–2.5)

## 2012-09-18 LAB — SODIUM
Sodium: 156 mEq/L — ABNORMAL HIGH (ref 135–145)
Sodium: 157 mEq/L — ABNORMAL HIGH (ref 135–145)

## 2012-09-18 LAB — CBC
MCH: 25.1 pg — ABNORMAL LOW (ref 26.0–34.0)
MCHC: 33.1 g/dL (ref 30.0–36.0)
Platelets: 172 10*3/uL (ref 150–400)

## 2012-09-18 LAB — GLUCOSE, CAPILLARY
Glucose-Capillary: 162 mg/dL — ABNORMAL HIGH (ref 70–99)
Glucose-Capillary: 114 mg/dL — ABNORMAL HIGH (ref 70–99)
Glucose-Capillary: 114 mg/dL — ABNORMAL HIGH (ref 70–99)

## 2012-09-18 LAB — BASIC METABOLIC PANEL
Calcium: 9.5 mg/dL (ref 8.4–10.5)
GFR calc Af Amer: >90 mL/min (ref >90)
GFR calc non Af Amer: >90 mL/min (ref >90)
Sodium: 154 mEq/L — ABNORMAL HIGH (ref 135–145)

## 2012-09-18 LAB — CULTURE, BLOOD (ROUTINE X 2)

## 2012-09-18 LAB — BLOOD GAS, ARTERIAL
Bicarbonate: 24.2 mEq/L — ABNORMAL HIGH (ref 20.0–24.0)
MECHVT: 500 mL
TCO2: 25.4 mmol/L (ref 0–100)
pCO2 arterial: 38.1 mmHg (ref 35.0–45.0)
pH, Arterial: 7.418 (ref 7.350–7.450)

## 2012-09-18 LAB — PHOSPHORUS: Phosphorus: 4.6 mg/dL (ref 2.3–4.6)

## 2012-09-18 MED ORDER — POTASSIUM CHLORIDE 20 MEQ/15ML (10%) PO LIQD
40.0000 meq | Freq: Three times a day (TID) | ORAL | Status: AC
  Administered 2012-09-18 (×2): 40 meq
  Filled 2012-09-18 (×3): qty 30

## 2012-09-18 MED ORDER — METOPROLOL TARTRATE 1 MG/ML IV SOLN
2.5000 mg | Freq: Four times a day (QID) | INTRAVENOUS | Status: DC | PRN
  Administered 2012-09-18 – 2012-10-15 (×5): 2.5 mg via INTRAVENOUS
  Filled 2012-09-18 (×5): qty 5

## 2012-09-18 NOTE — Progress Notes (Signed)
ANTIBIOTIC CONSULT NOTE - FOLLOW UP  Pharmacy Consult for Unasyn Indication: r/o aspiration PNA  Allergies  Allergen Reactions  . Contrast Media [Iodinated Diagnostic Agents] Hives    Patient Measurements: Height: 5\' 1"  (154.9 cm) Weight: 109 lb 5.6 oz (49.6 kg) (weighed pt with proper equipment removed from the bed) IBW/kg (Calculated) : 47.8  Vital Signs: Temp: 98.3 F (36.8 C) (09/18 0400) Temp src: Axillary (09/18 0400) BP: 122/68 mmHg (09/18 0700) Pulse Rate: 102 (09/18 0700) Intake/Output from previous day: 09/17 0701 - 09/18 0700 In: 3125 [I.V.:1290; NG/GT:1230; IV Piggyback:605] Out: 2362 [Urine:2120; Drains:142; Stool:100] Intake/Output from this shift:    Labs:  Recent Labs  09/16/12 0200 09/17/12 0200 09/18/12 0300  WBC 13.6* 13.9*  --   HGB 8.2* 8.2*  --   PLT 137* 153  --   CREATININE 0.48* 0.55 0.49*   Estimated Creatinine Clearance: 81.8 ml/min (by C-G formula based on Cr of 0.49). No results found for this basename: VANCOTROUGH, Leodis Binet, VANCORANDOM, GENTTROUGH, GENTPEAK, GENTRANDOM, TOBRATROUGH, TOBRAPEAK, TOBRARND, AMIKACINPEAK, AMIKACINTROU, AMIKACIN,  in the last 72 hours   Microbiology: Recent Results (from the past 720 hour(s))  MRSA PCR SCREENING     Status: None   Collection Time    09/11/12  1:32 PM      Result Value Range Status   MRSA by PCR NEGATIVE  NEGATIVE Final   Comment:            The GeneXpert MRSA Assay (FDA     approved for NASAL specimens     only), is one component of a     comprehensive MRSA colonization     surveillance program. It is not     intended to diagnose MRSA     infection nor to guide or     monitor treatment for     MRSA infections.  CULTURE, BLOOD (ROUTINE X 2)     Status: None   Collection Time    09/12/12  3:48 AM      Result Value Range Status   Specimen Description BLOOD RIGHT ANTECUBITAL   Final   Special Requests BOTTLES DRAWN AEROBIC ONLY 5CC   Final   Culture  Setup Time     Final   Value: 09/12/2012 11:37     Performed at [facility]   Culture     Final   Value:        BLOOD CULTURE RECEIVED NO GROWTH TO DATE CULTURE WILL BE HELD FOR 5 DAYS BEFORE ISSUING A FINAL NEGATIVE REPORT     Performed at [facility]   Report Status PENDING   Incomplete  CULTURE, BLOOD (ROUTINE X 2)     Status: None   Collection Time    09/12/12  4:00 AM      Result Value Range Status   Specimen Description BLOOD RIGHT HAND   Final   Special Requests BOTTLES DRAWN AEROBIC ONLY 5CC   Final   Culture  Setup Time     Final   Value: 09/12/2012 11:35     Performed at [facility]   Culture     Final   Value:        BLOOD CULTURE RECEIVED NO GROWTH TO DATE CULTURE WILL BE HELD FOR 5 DAYS BEFORE ISSUING A FINAL NEGATIVE REPORT     Performed at [facility]   Report Status PENDING   Incomplete  CULTURE, RESPIRATORY (NON-EXPECTORATED)     Status: None   Collection Time  09/12/12  8:15 AM      Result Value Range Status   Specimen Description TRACHEAL ASPIRATE   Final   Special Requests NONE   Final   Gram Stain     Final   Value: NO WBC SEEN     NO SQUAMOUS EPITHELIAL CELLS SEEN     RARE GRAM NEGATIVE COCCI     Performed at [facility]   Culture     Final   Value: Non-Pathogenic Oropharyngeal-type Flora Isolated.     Performed at [facility]   Report Status 09/14/2012 FINAL   Final  CLOSTRIDIUM DIFFICILE BY PCR     Status: None   Collection Time    09/14/12 10:52 PM      Result Value Range Status   C difficile by pcr NEGATIVE  NEGATIVE Final    Anti-infectives   Start     Dose/Rate Route Frequency Ordered Stop   09/12/12 1053  bacitracin 50,000 Units in sodium chloride irrigation 0.9 % 500 mL irrigation  Status:  Discontinued       As needed 09/12/12 1054 09/12/12 1212   09/12/12 0800  Ampicillin-Sulbactam (UNASYN) 3 g in sodium chloride 0.9 % 100 mL IVPB  Status:  Discontinued     3 g 100 mL/hr over 60 Minutes Intravenous Every 8  hours 09/12/12 0152 09/12/12 0746   09/12/12 0800  Ampicillin-Sulbactam (UNASYN) 3 g in sodium chloride 0.9 % 100 mL IVPB     3 g 100 mL/hr over 60 Minutes Intravenous 4 times per day 09/12/12 0746     09/12/12 0200  Ampicillin-Sulbactam (UNASYN) 3 g in sodium chloride 0.9 % 100 mL IVPB     3 g 100 mL/hr over 60 Minutes Intravenous  Once 09/12/12 0152 09/12/12 0336   09/11/12 2308  ceFAZolin (ANCEF) 1-5 GM-% IVPB    Comments:  Aydelette, Jamie   : cabinet override      09/11/12 2308 09/12/12 1114   09/11/12 1945  bacitracin 50,000 Units in sodium chloride irrigation 0.9 % 500 mL irrigation  Status:  Discontinued       As needed 09/11/12 2050 09/12/12 0030   09/11/12 1923  ceFAZolin (ANCEF) 2-3 GM-% IVPB SOLR    Comments:  Toney Sang   : cabinet override      09/11/12 1923 09/11/12 2310      Assessment: 27 y.o. F who was admitted on 9/11 with SAH, RMCA aneurysm and is s/p craniotomy and clipping. The patient continues on Unasyn for empiric aspiration PNA coverage -- today is D#7. Usual duration for aspiration PNA is 5-7 days. It is noted that the patient is still spiking fevers (101.3) -- which could be related to the brain injury. WBC 13.9, renal function stable.  Consider d/cing Unasyn since the patient has completed an adequate duration for aspiration PNA. Consider broadening antibiotics if still concerned for infection in the setting of continued fevers.  Goal of Therapy:  Proper antibiotics for infection/cultures adjusted for renal/hepatic function   Plan:  1. Continue Unasyn 3g IV every 6 hours 2. Consider d/cing Unasyn today and giving antibiotic holiday or broadening antibiotics if still concerned for infection 3. Will continue to follow renal function, culture results, LOT, and antibiotic de-escalation plans   Georgina Pillion, PharmD, BCPS Clinical Pharmacist Pager: [phone] 09/18/2012 7:36 AM

## 2012-09-18 NOTE — Progress Notes (Signed)
UR completed.   Kina Shiffman, RN BSN MHA CCM  [phone] 

## 2012-09-18 NOTE — Progress Notes (Signed)
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 Following commands.  No events overnight.  VITAL SIGNS: Temp:  [98 F (36.7 C)-101.3 F (38.5 C)] 99.3 F (37.4 C) (09/18 0800) Pulse Rate:  [78-124] 121 (09/18 1000) Resp:  [14-24] 24 (09/18 1000) BP: (101-164)/(51-94) 131/59 mmHg (09/18 1000) SpO2:  [99 %-100 %] 100 % (09/18 1000) FiO2 (%):  [30 %] 30 % (09/18 0843) Weight:  [49.6 kg (109 lb 5.6 oz)] 49.6 kg (109 lb 5.6 oz) (09/18 0500) HEMODYNAMICS:   VENTILATOR SETTINGS: Vent Mode:  [-] PSV;CPAP FiO2 (%):  [30 %] 30 % Set Rate:  [14 bmp] 14 bmp Vt Set:  [500 mL] 500 mL PEEP:  [5 cmH20] 5 cmH20 Pressure Support:  [5 cmH20-10 cmH20] 5 cmH20 Plateau Pressure:  [15 cmH20-20 cmH20] 20 cmH20 INTAKE / OUTPUT: Intake/Output     09/17 0701 - 09/18 0700 09/18 0701 - 09/19 0700   I.V. (mL/kg) 1290 (26) 120 (2.4)   Other  30   NG/GT 1230 150   IV Piggyback 605    Total Intake(mL/kg) 3125 (63) 300 (6)   Urine (mL/kg/hr) 2120 (1.8) 335 (1.8)   Drains 142 (0.1) 123 (0.7)   Stool 100 (0.1)    Total Output 2362 458   Net +763 -158         PHYSICAL EXAMINATION: Gen: young female, NAD on vent  HEENT: scalp dressing in place, drain in  place, ETT in place PULM: resps even non labored on PS 8/5, coarse, good air entry CV: RRR, loud diastolic/systolic murmur, no JVD AB: BS+, soft, nontender, no hsm Ext: warm, no edema, no clubbing, no cyanosis Derm: no rash or skin breakdown Neuro: awake, alert, appropriate, follows commands, waves when I enter room, minimal mvmt LUE, weak LLE, moves R side well.   LABS:  CBC Recent Labs     09/16/12  0200  09/17/12  0200  09/18/12  0930  WBC  13.6*  13.9*  12.8*  HGB  8.2*  8.2*  8.0*  HCT  24.5*  24.6*  24.2*  PLT  137*  153  172   Coag's No results found for this basename: APTT, INR,  in the last 72 hours BMET Recent Labs     09/16/12  0200   09/17/12  0200   09/17/12  2000  09/18/12  0300  09/18/12  0800  NA  151*   < >  154*   < >  155*  154*  152*  K  3.3*   --   4.3   --    --   3.5   --   CL  115*   --   120*   --    --  118*   --   CO2  25   --   27   --    --   28   --   BUN  12   --   15   --    --   24*   --   CREATININE  0.48*   --   0.55   --    --   0.49*   --   GLUCOSE  136*   --   140*   --    --   156*   --    < > = values in this interval not displayed.   Electrolytes Recent Labs     09/16/12  0200  09/17/12  0200  09/18/12  0300  CALCIUM  9.2  10.0  9.5  MG  2.0  2.3  2.4  PHOS  3.7  3.3  4.6   Sepsis Markers No results found for this basename: LACTICACIDVEN, PROCALCITON, O2SATVEN,  in the last 72 hours ABG Recent Labs     09/16/12  0443  09/17/12  0357  09/18/12  0508  PHART  7.445  7.414  7.418  PCO2ART  38.5  41.3  38.1  PO2ART  162.0*  147.0*  121.0*   Liver Enzymes No results found for this basename: AST, ALT, ALKPHOS, BILITOT, ALBUMIN,  in the last 72 hours Cardiac Enzymes No results found for this basename: TROPONINI, PROBNP,  in the last 72 hours Glucose Recent Labs     09/17/12  1253  09/17/12  1557  09/17/12  1942  09/17/12  2341  09/18/12  0405  09/18/12  0833  GLUCAP  107*  122*  112*  115*  162*  114*   Ct  Head Wo Contrast  09/17/2012   CLINICAL DATA:  Aneurysm clipping September 12. Ventricular catheter removed today. Neuro change  EXAM: CT HEAD WITHOUT CONTRAST  TECHNIQUE: Contiguous axial images were obtained from the base of the skull through the vertex without intravenous contrast.  COMPARISON:  CT 09/15/2012  FINDINGS: Interval removal of right frontal ventricular catheter. Interval intraventricular hemorrhage. There is moderate blood in the lateral, 3rd, and 4th ventricles. There is moderate ventricular dilatation which has developed since the 0 prior study compatible with acute hydrocephalus.  Aneurysm clip in the right MCA bifurcation territory. Blood remains in the right sylvian fissure, slightly improved. Extensive right MCA infarct again noted. Prior craniectomy on the right. Diffuse brain edema with herniation of brain through the craniectomy defect. Mild midline shift or to the left measures 3 mm. Right frontal and interhemispheric subdural hematoma is unchanged.  IMPRESSION: Acute intraventricular hemorrhage and ventricular dilatation has occurred since prior study. This may be related to ventricular catheter removal.  Progression of edema in the right MCA infarct with diffuse cerebral edema in the infarction. Mild midline shift of 3 mm to the left.  CriticalValue/emergent results were called by telephone at the time of interpretation on 09/17/2012 at 12:55 PMto [facility] , who verbally acknowledged these results.   Electronically Signed   By: Marlan Palau M.D.   On: 09/17/2012 12:55   Dg Chest Port 1 View  09/18/2012   *RADIOLOGY REPORT*  Clinical Data: Endotracheal tube placement  PORTABLE CHEST - 1 VIEW  Comparison: 09/17/2012  Findings: Endotracheal tube appropriately positioned.  Left subclavian approach central line tip terminates over the proximal SVC. Nasogastric tube terminates below the level of the diaphragms but the tip is not included on the  film.  Heart size normal.  The lungs  are clear allowing for overlying support apparatus obscuring detail.  No pleural effusion or supine evidence for pneumothorax.  IMPRESSION: Support apparatus as above.  No new focal pulmonary opacity or acute abnormality otherwise.   Original Report Authenticated By: Christiana Pellant, M.D.   Dg Chest Port 1 View  09/17/2012   *RADIOLOGY REPORT*  Clinical Data: Evaluate endotracheal tube placement  PORTABLE CHEST - 1 VIEW  Comparison: Portable chest x-ray of 09/16/2012  Findings: The tip of the endotracheal tube is approximately 4.1 cm above the carina.  The lungs appear well aerated.  No infiltrate or effusion is seen.  A left central venous line is noted with the tip overlying the mid upper SVC.  IMPRESSION:  1.  Tip of endotracheal tube approximately 1.1 cm above the carina. 2.  The lungs are clear. 3.  Left central venous line tip overlies the mid upper SVC.   Original Report Authenticated By: Dwyane Dee, M.D.   Ct Portable Head W/o Cm  09/17/2012   CLINICAL DATA:  27 year old female status post shunt removal, revision.  EXAM: CT HEAD WITHOUT CONTRAST  TECHNIQUE: Contiguous axial images were obtained from the base of the skull through the vertex without intravenous contrast.  COMPARISON:  09/17/2012 and earlier.  FINDINGS: Portable exam.  Stable visualized osseous structures except for new left frontal burr hole. Marland Kitchen Postoperative changes to the scalp. Stable orbits soft tissues. Oral enteric and endotracheal tube partially visible. Stable paranasal sinuses and mastoids.  New left vertex approach external ventricular drain, courses to the level of the 3rd ventricle. Trace subarachnoid hemorrhage at the left vertex near the drain course. Interval mildly decreased ventriculomegaly. Moderate to large volume of intraventricular hemorrhage not significantly changed.  Leftward midline shift of 5 mm has not significantly changed. Stable basilar cisterns. Stable extra-axial hemorrhage along the interhemispheric fissure  and along the right periphery of the brain where an aneurysm clip is re- identified. Edema in much of the right hemisphere in the area of the right craniectomy has not significantly changed. No superimposed new cortically based infarct identified.  IMPRESSION: 1. New left frontal approach EVD. Mildly decreased ventriculomegaly. Volume of intraventricular and intracranial hemorrhage not significantly changed.  2. Otherwise stable postoperative appearance of the brain. Stable leftward midline shift of 5 mm. Stable basilar cisterns.   Electronically Signed   By: Augusto Gamble M.D.   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  Weaning relatively ok. Spoke with father extensively today, informed him that the patient will likely need trach but will need to speak with NS prior to proceeding further with either extubation or trach/peg to determine prognosis.  Father is ok with whatever recommendations we make. P:   - Cont 3% saline per nsgy. - Nsgy following ventric and post op. - Cont keppra  - CT as ordered per NS and noted. - Continue PS trials, no extubation due to mental status.  PULMONARY A: Post op respiratory failure > good vent mechanics/oxygenation CAP vs aspiration pneumonia (favor aspiration) -improving on CXR Asthma> not in exacerbation P:   - PS wean as tol - will need clearance from NS prior to serious consideration of extubation. - Imagine will need tracheostomy, do not see reasonable chances at airway protection, will defer to neurosurgery at this time.  CARDIOVASCULAR A: Loud heart murmur >  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 ? 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 HD stable.  HTN. P:  - Tele. - BP control as ordered. - Echo with grade 2 diastolic dysfunction, thickening and EF of 60-65%.  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   Hypokalemia and hyponatremia. P:   - As per neuro section, cont 3% saline - F/u chem  - Replace K.  GASTROINTESTINAL A:  No acute issues P:   - Tol TF. - Pantoprazole for stress ulcer prophylaxis.  HEMATOLOGIC A:  Anemia P:  - Monitor h/h - Transfusion threshold < 7gm/dl  INFECTIOUS A:  Aspiration pneumonia P:   - Unasyn as ordered, last dose on 9/20. - F/u cultures NTD.  ENDOCRINE A:  No acute issues P:   - Monitor glucose.  CRITICAL CARE: The patient is critically ill with multiple organ systems failure and requires high complexity decision making for assessment and support, frequent evaluation and titration of therapies, application of advanced monitoring technologies and extensive interpretation of multiple databases. Critical Care Time devoted to patient care services described in this note is 35 minutes.   *Care during the described time interval was provided by me and/or other providers on the critical care team. I have reviewed this patient's available data, including medical history, events of note, physical examination and test results as part of my evaluation.  Alyson Reedy, M.D. [facility]. Pager: [phone]. After hours pager: [phone].

## 2012-09-18 NOTE — Progress Notes (Signed)
Pt seen and examined. No issues overnight. Pt remained hemodynamically stable with stable neurologic exam per RN.  EXAM: Temp:  [98 F (36.7 C)-101.3 F (38.5 C)] 98.3 F (36.8 C) (09/18 0400) Pulse Rate:  [78-123] 102 (09/18 0700) Resp:  [14-22] 14 (09/18 0700) BP: (101-164)/(51-94) 122/68 mmHg (09/18 0700) SpO2:  [99 %-100 %] 100 % (09/18 0700) FiO2 (%):  [30 %] 30 % (09/18 0400) Weight:  [49.6 kg (109 lb 5.6 oz)] 49.6 kg (109 lb 5.6 oz) (09/18 0500)  Intake/Output     09/17 0701 - 09/18 0700 09/18 0701 - 09/19 0700   I.V. (mL/kg) 1290 (26) 40 (0.8)   Other     NG/GT 1230    IV Piggyback 605    Total Intake(mL/kg) 3125 (63) 40 (0.8)   Urine (mL/kg/hr) 2120 (1.8)    Drains 142 (0.1)    Stool 100 (0.1)    Total Output 2362     Net +763 +40         EVD open at 15, 142cc x24hrs  Awake, alert, intubated Intubated, breathing over vent Follows commands RUE, RLE. RN noted FC LLE intermittently. W/D LUE Wound c/d/i  LABS: Lab Results  Component Value Date   CREATININE 0.49* 09/18/2012   BUN 24* 09/18/2012   NA 154* 09/18/2012   K 3.5 09/18/2012   CL 118* 09/18/2012   CO2 28 09/18/2012   Lab Results  Component Value Date   WBC 13.9* 09/17/2012   HGB 8.2* 09/17/2012   HCT 24.6* 09/17/2012   MCV 75.5* 09/17/2012   PLT 153 09/17/2012    IMAGING: TCD data reviewed, velocities not indicative of spasm.  IMPRESSION: - 27 y.o. female SAH d# 7 s/p RMCA aneurysm clipping, craniectomy, and IVH yesterday after drain removal requiring left EVD placment - Returning to neurologic baseline.  PLAN: - Continue EVD at 15, 3% at 40cc/hr - Spasm prophylaxis - Nimotop, Zocor. - Allow BP to autoregulate - Vent mgmt per CCM - may D/C Unasyn for aspiration pna today. - Maintain euvolemia, electrolyte replacement per protocol. Cont TF - F/U blood/urine culture results sent yesterday.

## 2012-09-19 ENCOUNTER — Inpatient Hospital Stay (HOSPITAL_COMMUNITY): Payer: Medicaid Other

## 2012-09-19 LAB — BASIC METABOLIC PANEL
Calcium: 9.5 mg/dL (ref 8.4–10.5)
Creatinine, Ser: 0.62 mg/dL (ref 0.50–1.10)
GFR calc Af Amer: >90 mL/min (ref >90)
GFR calc non Af Amer: >90 mL/min (ref >90)

## 2012-09-19 LAB — URINE CULTURE

## 2012-09-19 LAB — CBC
MCH: 24.9 pg — ABNORMAL LOW (ref 26.0–34.0)
MCV: 77.4 fL — ABNORMAL LOW (ref 78.0–100.0)
Platelets: 193 10*3/uL (ref 150–400)
RDW: 19.9 % — ABNORMAL HIGH (ref 11.5–15.5)

## 2012-09-19 LAB — GLUCOSE, CAPILLARY
Glucose-Capillary: 131 mg/dL — ABNORMAL HIGH (ref 70–99)
Glucose-Capillary: 127 mg/dL — ABNORMAL HIGH (ref 70–99)
Glucose-Capillary: 133 mg/dL — ABNORMAL HIGH (ref 70–99)
Glucose-Capillary: 140 mg/dL — ABNORMAL HIGH (ref 70–99)

## 2012-09-19 LAB — MAGNESIUM: Magnesium: 2.5 mg/dL (ref 1.5–2.5)

## 2012-09-19 LAB — SODIUM: Sodium: 153 mEq/L — ABNORMAL HIGH (ref 135–145)

## 2012-09-19 MED ORDER — SODIUM CHLORIDE 0.9 % IV SOLN
INTRAVENOUS | Status: DC
  Administered 2012-09-19 – 2012-09-26 (×18): via INTRAVENOUS
  Administered 2012-09-26 – 2012-09-27 (×3): 1000 mL via INTRAVENOUS
  Administered 2012-09-27 – 2012-09-28 (×2): via INTRAVENOUS
  Administered 2012-09-28 (×2): 1000 mL via INTRAVENOUS
  Administered 2012-09-28 – 2012-09-29 (×2): via INTRAVENOUS
  Administered 2012-09-29: 1000 mL via INTRAVENOUS
  Administered 2012-09-30 – 2012-10-05 (×8): via INTRAVENOUS
  Administered 2012-10-05: 100 mL/h via INTRAVENOUS

## 2012-09-19 NOTE — Progress Notes (Signed)
Transcranial Doppler  Date POD PCO2 HCT BP  MCA ACA PCA OPHT SIPH VERT Basilar  09/15/12 MS    131/78 Right  Left   59  69   -68  -74   33  20   14  14    -24  46   -26  *     *    09/17/12 MS     Right  Left   42  35   -22  -25   25  29   21  19   26   -40   -21  -25     -19    09/19/12 MS     Right  Left   132  35   -33  -40   12  -25    16  26   22   44   -22  -24     -24          Right  Left                                             Right  Left                                            Right  Left                                            Right  Left                                        MCA = Middle Cerebral Artery      OPHT = Opthalmic Artery     BASILAR = Basilar Artery   ACA = Anterior Cerebral Artery     SIPH = Carotid Siphon PCA = Posterior Cerebral Artery   VERT = Verterbral Artery                   Normal MCA = 62+\-12 ACA = 50+\-12 PCA = 42+\-23   * Unable to insonate.   09/19/2012 1:45 PM Gertie Fey, RVT, RDCS, RDMS

## 2012-09-19 NOTE — Progress Notes (Signed)
1610 Paged Kritzer to notify that pupils had become unequal with the right pupil a 3 and the left a 2. Pt still following commands and arousable. No orders given at this time. Will continue to monitor.

## 2012-09-19 NOTE — Progress Notes (Signed)
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 Following commands.  No events overnight.  VITAL SIGNS: Temp:  [98.4 F (36.9 C)-100.3 F (37.9 C)] 98.8 F (37.1 C) (09/19 0800) Pulse Rate:  [85-131] 101 (09/19 1100) Resp:  [14-33] 18 (09/19 1100) BP: (97-151)/(49-94) 150/79 mmHg (09/19 1000) SpO2:  [100 %] 100 % (09/19 1100) FiO2 (%):  [30 %] 30 % (09/19 0745) Weight:  [47.6 kg (104 lb 15 oz)] 47.6 kg (104 lb 15 oz) (09/19 0406) HEMODYNAMICS:   VENTILATOR SETTINGS: Vent Mode:  [-] PSV;CPAP FiO2 (%):  [30 %] 30 % Set Rate:  [14 bmp] 14 bmp Vt Set:  [500 mL] 500 mL PEEP:  [5 cmH20] 5 cmH20 Pressure Support:  [5 cmH20] 5 cmH20 Plateau Pressure:  [18 cmH20-19 cmH20] 18 cmH20 INTAKE / OUTPUT: Intake/Output     09/18 0701 - 09/19 0700 09/19 0701 - 09/20 0700   I.V. (mL/kg) 1000 (21) 341.7 (7.2)   Other 240 90   NG/GT 1230 200   IV Piggyback 400    Total Intake(mL/kg) 2870 (60.3) 631.7 (13.3)   Urine (mL/kg/hr) 2985 (2.6) 515 (2.6)   Drains 185 (0.2) 34 (0.2)   Stool 250 (0.2)    Total Output 3420 549   Net -550 +82.7         PHYSICAL EXAMINATION: Gen: young female, NAD on vent  HEENT: scalp dressing in place, drain in  place, ETT in place PULM: resps even non labored on PS 8/5, coarse, good air entry CV: RRR, loud diastolic/systolic murmur, no JVD AB: BS+, soft, nontender, no hsm Ext: warm, no edema, no clubbing, no cyanosis Derm: no rash or skin breakdown Neuro: awake, alert, appropriate, follows commands, waves when I enter room, minimal mvmt LUE, weak LLE, moves R side well.   LABS:  CBC Recent Labs     09/17/12  0200  09/18/12  0930  09/19/12  0200  WBC  13.9*  12.8*  12.5*  HGB  8.2*  8.0*  7.5*  HCT  24.6*  24.2*  23.3*  PLT  153  172  193   Coag's No results found for this basename: APTT, INR,  in the last 72 hours BMET Recent Labs     09/17/12  0200   09/18/12  0300   09/18/12  2000  09/19/12  0200  09/19/12  0740  NA  154*   < >  154*   < >  157*  156*  153*  K  4.3   --   3.5   --    --   4.2   --   CL  120*   --   118*   --    --  122*   --   CO2  27   --   28   --    --   26   --   BUN  15   --   24*   --    --   33*   --   CREATININE  0.55   --   0.49*   --    --   0.62   --   GLUCOSE  140*   --   156*   --    --   131*   --    < > = values in this interval not displayed.   Electrolytes Recent Labs     09/17/12  0200  09/18/12  0300  09/19/12  0200  CALCIUM  10.0  9.5  9.5  MG  2.3  2.4  2.5  PHOS  3.3  4.6  4.9*   Sepsis Markers No results found for this basename: LACTICACIDVEN, PROCALCITON, O2SATVEN,  in the last 72 hours ABG Recent Labs     09/17/12  0357  09/18/12  0508  PHART  7.414  7.418  PCO2ART  41.3  38.1  PO2ART  147.0*  121.0*   Liver Enzymes No results found for this basename: AST, ALT, ALKPHOS, BILITOT, ALBUMIN,  in the last 72 hours Cardiac Enzymes No results found for this basename: TROPONINI, PROBNP,  in the last 72 hours Glucose Recent Labs     09/18/12  1203  09/18/12  1611  09/18/12  1957  09/19/12  0042  09/19/12  0401  09/19/12  0807  GLUCAP  114*  113*  123*  131*  127*  133*   Ct Head Wo Contrast  09/17/2012    CLINICAL DATA:  Aneurysm clipping September 12. Ventricular catheter removed today. Neuro change  EXAM: CT HEAD WITHOUT CONTRAST  TECHNIQUE: Contiguous axial images were obtained from the base of the skull through the vertex without intravenous contrast.  COMPARISON:  CT 09/15/2012  FINDINGS: Interval removal of right frontal ventricular catheter. Interval intraventricular hemorrhage. There is moderate blood in the lateral, 3rd, and 4th ventricles. There is moderate ventricular dilatation which has developed since the 0 prior study compatible with acute hydrocephalus.  Aneurysm clip in the right MCA bifurcation territory. Blood remains in the right sylvian fissure, slightly improved. Extensive right MCA infarct again noted. Prior craniectomy on the right. Diffuse brain edema with herniation of brain through the craniectomy defect. Mild midline shift or to the left measures 3 mm. Right frontal and interhemispheric subdural hematoma is unchanged.  IMPRESSION: Acute intraventricular hemorrhage and ventricular dilatation has occurred since prior study. This may be related to ventricular catheter removal.  Progression of edema in the right MCA infarct with diffuse cerebral edema in the infarction. Mild midline shift of 3 mm to the left.  CriticalValue/emergent results were called by telephone at the time of interpretation on 09/17/2012 at 12:55 PMto [facility] , who verbally acknowledged these results.   Electronically Signed   By: Marlan Palau M.D.   On: 09/17/2012 12:55   Dg Chest Port 1 View  09/19/2012   CLINICAL DATA:  Respiratory failure  EXAM: PORTABLE CHEST - 1 VIEW  COMPARISON:  09/18/2012  FINDINGS: Endotracheal tube remains with the tip approximately cm above the carinal. Central line and nasogastric tube positioning are stable. Mild increase in atelectasis identified at the right lung base. No edema or pleural fluid is and mediastinal contours are stable.  IMPRESSION: Mild increase  in right basilar  atelectasis   Electronically Signed   By: Irish Lack   On: 09/19/2012 07:52   Dg Chest Port 1 View  09/18/2012   *RADIOLOGY REPORT*  Clinical Data: Endotracheal tube placement  PORTABLE CHEST - 1 VIEW  Comparison: 09/17/2012  Findings: Endotracheal tube appropriately positioned.  Left subclavian approach central line tip terminates over the proximal SVC. Nasogastric tube terminates below the level of the diaphragms but the tip is not included on the film.  Heart size normal.  The lungs are clear allowing for overlying support apparatus obscuring detail.  No pleural effusion or supine evidence for pneumothorax.  IMPRESSION: Support apparatus as above.  No new focal pulmonary opacity or acute abnormality otherwise.   Original Report Authenticated By: Christiana Pellant, M.D.   Ct Portable Head W/o Cm  09/17/2012   CLINICAL DATA:  27 year old female status post shunt removal, revision.  EXAM: CT HEAD WITHOUT CONTRAST  TECHNIQUE: Contiguous axial images were obtained from the base of the skull through the vertex without intravenous contrast.  COMPARISON:  09/17/2012 and earlier.  FINDINGS: Portable exam.  Stable visualized osseous structures except for new left frontal burr hole. Marland Kitchen Postoperative changes to the scalp. Stable orbits soft tissues. Oral enteric and endotracheal tube partially visible. Stable paranasal sinuses and mastoids.  New left vertex approach external ventricular drain, courses to the level of the 3rd ventricle. Trace subarachnoid hemorrhage at the left vertex near the drain course. Interval mildly decreased ventriculomegaly. Moderate to large volume of intraventricular hemorrhage not significantly changed.  Leftward midline shift of 5 mm has not significantly changed. Stable basilar cisterns. Stable extra-axial hemorrhage along the interhemispheric fissure and along the right periphery of the brain where an aneurysm clip is re- identified. Edema in much of the right hemisphere in the area of  the right craniectomy has not significantly changed. No superimposed new cortically based infarct identified.  IMPRESSION: 1. New left frontal approach EVD. Mildly decreased ventriculomegaly. Volume of intraventricular and intracranial hemorrhage not significantly changed.  2. Otherwise stable postoperative appearance of the brain. Stable leftward midline shift of 5 mm. Stable basilar cisterns.   Electronically Signed   By: Augusto Gamble M.D.   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  Weaning relatively ok. Spoke with father extensively today, informed him that the patient will likely need trach but will need to speak with NS prior to proceeding further with either extubation or trach/peg to determine prognosis.  Father is ok with whatever recommendations we make. P:   - Cont 3% saline per nsgy. - Nsgy following ventric and post op. - Cont keppra  - CT as ordered per NS and noted. - Continue PS trials, no extubation due to mental status.  PULMONARY A: Post op respiratory failure > good vent mechanics/oxygenation CAP vs aspiration pneumonia (favor aspiration) -improving on CXR Asthma> not in exacerbation P:   - PS wean as tol - will need clearance from NS prior to serious consideration of extubation. - Imagine will need tracheostomy, do not see reasonable chances at airway protection, will defer to neurosurgery at this time.  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 HD stable.  HTN. P:  - Tele. - BP control as ordered. - Echo with grade 2 diastolic dysfunction, thickening and  EF of 60-65%.  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  Hypokalemia and hyponatremia. P:   - As per neuro section, cont 3% saline - F/u chem  - Replace K.  GASTROINTESTINAL A:  No acute  issues P:   - Tol TF. - Pantoprazole for stress ulcer prophylaxis.  HEMATOLOGIC A:  Anemia P:  - Monitor h/h - Transfusion threshold < 7gm/dl  INFECTIOUS A:  Aspiration pneumonia P:   - Unasyn as ordered, last dose on 9/20. - Cultures NTD.  ENDOCRINE A:  No acute issues P:   - Monitor glucose.  CRITICAL CARE: The patient is critically ill with multiple organ systems failure and requires high complexity decision making for assessment and support, frequent evaluation and titration of therapies, application of advanced monitoring technologies and extensive interpretation of multiple databases. Critical Care Time devoted to patient care services described in this note is 35 minutes.   *Care during the described time interval was provided by me and/or other providers on the critical care team. I have reviewed this patient's available data, including medical history, events of note, physical examination and test results as part of my evaluation.  Alyson Reedy, M.D. [facility] Pulmonary/Critical Care Medicine. Pager: [phone]. After hours pager: [phone].

## 2012-09-20 ENCOUNTER — Inpatient Hospital Stay (HOSPITAL_COMMUNITY): Payer: Medicaid Other

## 2012-09-20 LAB — CBC
HCT: 24.1 % — ABNORMAL LOW (ref 36.0–46.0)
MCH: 25.2 pg — ABNORMAL LOW (ref 26.0–34.0)
MCHC: 32.8 g/dL (ref 30.0–36.0)
MCV: 76.8 fL — ABNORMAL LOW (ref 78.0–100.0)
Platelets: 195 10*3/uL (ref 150–400)
RDW: 19.6 % — ABNORMAL HIGH (ref 11.5–15.5)
WBC: 12.4 10*3/uL — ABNORMAL HIGH (ref 4.0–10.5)

## 2012-09-20 LAB — BASIC METABOLIC PANEL
BUN: 33 mg/dL — ABNORMAL HIGH (ref 6–23)
Calcium: 9.2 mg/dL (ref 8.4–10.5)
Chloride: 117 mEq/L — ABNORMAL HIGH (ref 96–112)
Creatinine, Ser: 0.52 mg/dL (ref 0.50–1.10)
GFR calc Af Amer: >90 mL/min (ref >90)

## 2012-09-20 LAB — MAGNESIUM: Magnesium: 2.3 mg/dL (ref 1.5–2.5)

## 2012-09-20 LAB — GLUCOSE, CAPILLARY
Glucose-Capillary: 123 mg/dL — ABNORMAL HIGH (ref 70–99)
Glucose-Capillary: 98 mg/dL (ref 70–99)
Glucose-Capillary: 121 mg/dL — ABNORMAL HIGH (ref 70–99)

## 2012-09-20 LAB — SODIUM
Sodium: 150 mEq/L — ABNORMAL HIGH (ref 135–145)
Sodium: 151 mEq/L — ABNORMAL HIGH (ref 135–145)

## 2012-09-20 NOTE — Progress Notes (Signed)
Patient ID: Sally Nelson, female   DOB: 1985/11/17, 27 y.o.   MRN: [id number] She appears to be stable. At times she is a little harder to arouse according to the nurses. Her eyes remained open with very slow blink rate. EVD is patent at 15 cm. A central line in place. Stable hemiparesis. Still follows commands intermittently. Continue current management. Likely needs tracheostomy.

## 2012-09-20 NOTE — Progress Notes (Signed)
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 Following commands.  No events overnight.    SUBJECTIVE/OVERNIGHT/INTERVAL HX 09/20/12: Waxing and waning mental status. Today more drowst. Doing SBT but not extubation candidate  VITAL SIGNS: Temp:  [98.8 F (37.1 C)-100 F (37.8 C)] 98.9 F (37.2 C) (09/20 0750) Pulse Rate:  [90-133] 94 (09/20 1000) Resp:  [14-26] 22 (09/20 1000) BP: (98-164)/(42-113) 111/63 mmHg (09/20 1000) SpO2:  [97 %-100 %] 99 % (09/20 1000) FiO2 (%):  [30 %] 30 % (09/20 0800) Weight:  [49.9 kg (110 lb 0.2 oz)] 49.9 kg (110 lb 0.2 oz) (09/20 0500) HEMODYNAMICS:   VENTILATOR SETTINGS: Vent Mode:  [-] PSV;CPAP FiO2 (%):  [30 %] 30 % Set Rate:  [14 bmp] 14 bmp Vt Set:  [500 mL] 500 mL PEEP:  [5 cmH20] 5 cmH20 Pressure Support:  [5 cmH20] 5 cmH20 Plateau Pressure:  [14 cmH20-20 cmH20] 18 cmH20 INTAKE / OUTPUT: Intake/Output     09/19 0701 - 09/20 0700 09/20 0701 - 09/21 0700   I.V. (mL/kg) 2141.7 (42.9) 300 (6)   Other 170    NG/GT 1150 220   IV Piggyback 400    Total Intake(mL/kg) 3861.7 (77.4) 520 (10.4)   Urine (mL/kg/hr) 2335 (1.9) 110 (0.6)   Drains 185 (0.2) 10 (0.1)   Stool 425 (0.4)    Total Output  2945 120   Net +916.7 +400         PHYSICAL EXAMINATION: Gen: young female, NAD on vent  HEENT: scalp dressing in place, drain in place, ETT in place PULM: resps even non labored on PS 8/5, coarse, good air entry CV: RRR, loud diastolic/systolic murmur, no JVD AB: BS+, soft, nontender, no hsm Ext: warm, no edema, no clubbing, no cyanosis Derm: no rash or skin breakdown Neuro: per dr Yetta Barre neurosurgeon: At times she is a little harder to arouse according to the nurses. Her eyes remained open with very slow blink rate. I agree with this. Today RASS -3 without sedation    LABS: PULMONARY  Recent Labs Lab 09/16/12 0443 09/17/12 0357 09/18/12 0508  PHART 7.445 7.414 7.418  PCO2ART 38.5 41.3 38.1  PO2ART 162.0* 147.0* 121.0*  HCO3 26.0* 25.9* 24.2*  TCO2 27.2 27.2 25.4  O2SAT 99.2 99.0 98.7    CBC  Recent Labs Lab 09/18/12 0930 09/19/12 0200 09/20/12 0214  HGB 8.0* 7.5* 7.9*  HCT 24.2* 23.3* 24.1*  WBC 12.8* 12.5* 12.4*  PLT 172 193 195    COAGULATION No results found for this basename: INR,  in the last 168 hours  CARDIAC  No results found for this basename: TROPONINI,  in the last 168 hours No results found for this basename: PROBNP,  in the last 168 hours  CHEMISTRY  Recent Labs Lab 09/16/12 0200  09/17/12 0200  09/18/12 0300  09/19/12 0200 09/19/12 0740 09/19/12 1400 09/19/12 1900 09/20/12 0214 09/20/12 0800  NA 151*  < > 154*  < > 154*  < > 156* 153* 151* 153* 152* 151*  K 3.3*  --  4.3  --  3.5  --  4.2  --   --   --  3.6  --   CL 115*  --  120*  --  118*  --  122*  --   --   --  117*  --   CO2 25  --  27  --  28  --  26  --   --   --  27  --   GLUCOSE 136*  --  140*  --  156*  --  131*  --   --   --  125*  --   BUN 12  --  15  --  24*  --  33*  --   --   --  33*  --   CREATININE 0.48*  --  0.55  --  0.49*  --  0.62  --   --   --  0.52  --   CALCIUM 9.2  --  10.0  --  9.5  --  9.5  --   --   --  9.2  --   MG 2.0  --  2.3  --  2.4  --  2.5  --    --   --  2.3  --   PHOS 3.7  --  3.3  --  4.6  --  4.9*  --   --   --  4.2  --   < > = values in this interval not displayed. Estimated Creatinine Clearance: 81.8 ml/min (by C-G formula based on Cr of 0.52).   LIVER No results found for this basename: AST, ALT, ALKPHOS, BILITOT, PROT, ALBUMIN, INR,  in the last 168 hours   INFECTIOUS No results found for this basename: LATICACIDVEN, PROCALCITON,  in the last 168 hours   ENDOCRINE CBG (last 3)   Recent Labs  09/20/12 0006 09/20/12 0405 09/20/12 0823  GLUCAP 135* 123* 98         IMAGING x48h  Dg Chest Port 1 View  09/20/2012   CLINICAL DATA:  ETT  EXAM: PORTABLE CHEST - 1 VIEW  COMPARISON:  09/19/2012  FINDINGS: Endotracheal tube terminates 5 cm above the carina.  Lungs are clear. No pleural effusion or pneumothorax.  The heart is normal in size.  Stable left subclavian venous catheter and enteric tube.  IMPRESSION: Endotracheal tube terminates 5 cm above the carina.   Electronically Signed   By: Charline Bills M.D.   On: 09/20/2012 09:22   Dg Chest Port 1 View  09/19/2012   CLINICAL DATA:  Respiratory failure  EXAM: PORTABLE CHEST - 1 VIEW  COMPARISON:  09/18/2012  FINDINGS: Endotracheal tube remains with the tip approximately cm above the carinal. Central line and nasogastric tube positioning are stable. Mild increase in atelectasis identified at the right lung base. No edema or pleural fluid is and mediastinal contours are stable.  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  Weaning relatively ok.  09/19/12: Spoke with father extensively today, informed him that the patient will likely need trach but will need to speak with NS prior to proceeding further with either extubation or trach/peg to  determine prognosis.  Father is ok with whatever recommendations we make.  09/20/12: RASS -3 without sedation   P:   - Cont 3% saline per nsgy. - Nsgy following ventric and post op. - Cont keppra  - CT as ordered per NS - Continue PS trials, no extubation due to mental status.  PULMONARY A: Post op respiratory failure > good vent mechanics/oxygenation CAP vs aspiration pneumonia (favor aspiration) -improving on CXR Asthma> not in exacerbation  09/20/12: Doing SBT but not extubatyion candidate due to mental status P:   - PS wean as tol - No extubation - Will need tracheostomy, do not see reasonable chances at airway protection, will defer to neurosurgery at this time.  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 HD stable.  HTN.- Echo with grade 2 diastolic dysfunction, thickening and EF of 60-65%. P:  - Tele. - BP control as ordered.   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  Hypokalemia and hyponatremia. P:   - As per neuro section, cont 3% saline - F/u chem  - Replace K.  GASTROINTESTINAL A:  No acute issues P:   - Tol TF. - Pantoprazole for stress ulcer prophylaxis.  HEMATOLOGIC A:  Anemia P:  - Monitor h/h - Transfusion threshold < 7gm/dl  INFECTIOUS A:  Aspiration pneumonia P:   - Unasyn as ordered, last dose on 9/20. - Cultures NTD.  ENDOCRINE A:  No acute issues P:   - Monitor glucose.   The patient is critically ill with multiple organ systems failure and requires high complexity decision making for assessment and support, frequent evaluation and titration of therapies, application of advanced monitoring technologies and extensive interpretation of multiple databases.   Critical Care Time devoted to patient care services described in this note is  31  Minutes.  Dr. Kalman Shan, M.D., [facility].C.P Pulmonary and Critical Care Medicine Staff  Physician [facility] [place] Pulmonary and Critical Care Pager: [phone], If no answer or between  15:00h - 7:00h: call [phone]  09/20/2012 10:59 AM

## 2012-09-21 ENCOUNTER — Inpatient Hospital Stay (HOSPITAL_COMMUNITY): Payer: Medicaid Other

## 2012-09-21 LAB — CBC WITH DIFFERENTIAL/PLATELET
Basophils Absolute: 0 10*3/uL (ref 0.0–0.1)
Lymphs Abs: 3.5 10*3/uL (ref 0.7–4.0)
MCH: 25 pg — ABNORMAL LOW (ref 26.0–34.0)
MCV: 76.4 fL — ABNORMAL LOW (ref 78.0–100.0)
Monocytes Absolute: 0.3 10*3/uL (ref 0.1–1.0)
Neutrophils Relative %: 64 % (ref 43–77)
Platelets: 154 10*3/uL (ref 150–400)
RBC: 2.8 MIL/uL — ABNORMAL LOW (ref 3.87–5.11)
RDW: 19.6 % — ABNORMAL HIGH (ref 11.5–15.5)
WBC: 11.3 10*3/uL — ABNORMAL HIGH (ref 4.0–10.5)

## 2012-09-21 LAB — BASIC METABOLIC PANEL
Calcium: 9.5 mg/dL (ref 8.4–10.5)
Creatinine, Ser: 0.55 mg/dL (ref 0.50–1.10)
GFR calc Af Amer: >90 mL/min (ref >90)
GFR calc non Af Amer: >90 mL/min (ref >90)
Sodium: 147 mEq/L — ABNORMAL HIGH (ref 135–145)

## 2012-09-21 LAB — MAGNESIUM: Magnesium: 2.1 mg/dL (ref 1.5–2.5)

## 2012-09-21 LAB — GLUCOSE, CAPILLARY
Glucose-Capillary: 102 mg/dL — ABNORMAL HIGH (ref 70–99)
Glucose-Capillary: 102 mg/dL — ABNORMAL HIGH (ref 70–99)

## 2012-09-21 LAB — PHOSPHORUS: Phosphorus: 4.1 mg/dL (ref 2.3–4.6)

## 2012-09-21 MED ORDER — POTASSIUM CHLORIDE 20 MEQ/15ML (10%) PO LIQD
40.0000 meq | ORAL | Status: AC
  Administered 2012-09-21 (×2): 40 meq
  Filled 2012-09-21 (×2): qty 30

## 2012-09-21 MED ORDER — PANTOPRAZOLE SODIUM 40 MG PO PACK
40.0000 mg | PACK | Freq: Every day | ORAL | Status: DC
  Administered 2012-09-22 – 2012-10-26 (×34): 40 mg
  Filled 2012-09-21 (×39): qty 20

## 2012-09-21 NOTE — Progress Notes (Signed)
eLink Physician-Brief Progress Note Patient Name: Sally Nelson DOB: 04-Aug-1985 MRN: [id number]  Date of Service  09/21/2012   HPI/Events of Note  Hypokalemia   eICU Interventions  Potassium replaced   Intervention Category Minor Interventions: Electrolytes abnormality - evaluation and management  Sally Nelson 09/21/2012, 3:03 AM

## 2012-09-21 NOTE — Progress Notes (Signed)
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 Following commands.  No events overnight.  Spoke with father extensively today, informed him that the patient will likely need trach but will need to speak with NS prior to proceeding further with either extubation or trach/peg to determine prognosis.  Father is ok with whatever recommendations we make. 9/20 - Waxing and waning mental status. More drowsy. Doing SBT but not extubation candidate     SUBJECTIVE/OVERNIGHT/INTERVAL HX:  RN reports no acute events, weaning on PSV but no change in mental status  VITAL SIGNS: Temp:  [98 F (36.7 C)-101 F (38.3 C)] 98.7 F (37.1 C) (09/21 0400) Pulse Rate:  [89-116] 111 (09/21 0900) Resp:  [14-24] 23 (09/21 0900) BP: (94-131)/(47-83) 109/47 mmHg (09/21 0900) SpO2:  [95 %-100 %] 95 % (09/21 0900) FiO2 (%):  [30 %] 30 % (09/21 0800) Weight:  [112 lb 14 oz (51.2 kg)] 112 lb 14 oz (51.2 kg) (09/21 0100)  VENTILATOR SETTINGS: Vent Mode:  [-] CPAP;PSV FiO2 (%):  [30 %] 30 % Set Rate:  [14 bmp] 14 bmp Vt Set:  [500 mL] 500 mL PEEP:  [5 cmH20] 5 cmH20 Pressure Support:  [5 cmH20-8 cmH20] 5  cmH20 Plateau Pressure:  [20 cmH20-23 cmH20] 20 cmH20  INTAKE / OUTPUT: Intake/Output     09/20 0701 - 09/21 0700 09/21 0701 - 09/22 0700   I.V. (mL/kg) 2200 (43) 200 (3.9)   Other     NG/GT 1760 130   IV Piggyback 610    Total Intake(mL/kg) 4570 (89.3) 330 (6.4)   Urine (mL/kg/hr) 1410 (1.1) 250 (1.9)   Drains 153 (0.1) 17 (0.1)   Stool 200 (0.2)    Total Output 1763 267   Net +2807 +63        Stool Occurrence 3 x     PHYSICAL EXAMINATION: Gen: young female, NAD on vent  HEENT: scalp dressing in place, drain in place, ETT in place PULM: resps even non labored on PSV, coarse, good air entry CV: RRR, loud diastolic/systolic murmur, no JVD AB: BS+, soft, nontender, no hsm Ext: warm, no edema, no clubbing, no cyanosis Derm: no rash or skin breakdown Neuro: eyes open, no response to verbal command, occ follow commands  LABS: PULMONARY  Recent Labs Lab 09/16/12 0443 09/17/12 0357 09/18/12 0508  PHART 7.445 7.414 7.418  PCO2ART 38.5 41.3 38.1  PO2ART 162.0* 147.0* 121.0*  HCO3 26.0* 25.9* 24.2*  TCO2 27.2 27.2 25.4  O2SAT 99.2 99.0 98.7   CBC  Recent Labs Lab 09/19/12 0200 09/20/12 0214 09/21/12 0145  HGB 7.5* 7.9* 7.0*  HCT 23.3* 24.1* 21.4*  WBC 12.5* 12.4* 11.3*  PLT 193 195 154  CHEMISTRY  Recent Labs Lab 09/17/12 0200  09/18/12 0300  09/19/12 0200  09/20/12 0214 09/20/12 0800 09/20/12 1438 09/20/12 2000 09/21/12 0145 09/21/12 0820  NA 154*  < > 154*  < > 156*  < > 152* 151* 150* 151* 147* 146*  K 4.3  --  3.5  --  4.2  --  3.6  --   --   --  3.2*  --   CL 120*  --  118*  --  122*  --  117*  --   --   --  113*  --   CO2 27  --  28  --  26  --  27  --   --   --  24  --   GLUCOSE 140*  --  156*  --  131*  --  125*  --   --   --  114*  --   BUN 15  --  24*  --  33*  --  33*  --   --   --  32*  --   CREATININE 0.55  --  0.49*  --  0.62  --  0.52  --   --   --  0.55  --   CALCIUM 10.0  --  9.5  --  9.5  --  9.2  --   --   --  9.5  --   MG 2.3  --   2.4  --  2.5  --  2.3  --   --   --  2.1  --   PHOS 3.3  --  4.6  --  4.9*  --  4.2  --   --   --  4.1  --   < > = values in this interval not displayed. Estimated Creatinine Clearance: 81.8 ml/min (by C-G formula based on Cr of 0.55).  ENDOCRINE CBG (last 3)   Recent Labs  09/21/12 0009 09/21/12 0353 09/21/12 0725  GLUCAP 107* 102* 102*    IMAGING x48h  Dg Chest Port 1 View  09/21/2012   CLINICAL DATA:  Respiratory difficulty  EXAM: PORTABLE CHEST - 1 VIEW  COMPARISON:  09/20/2012  FINDINGS: The heart is normal in size. Endotracheal tube, NG tube, left subclavian central venous catheter is stable. Hyperaeration stable. No pneumothorax.  IMPRESSION: Stable exam. Clear lungs.   Electronically Signed   By: Maryclare Bean M.D.   On: 09/21/2012 07:25   Dg Chest Port 1 View  09/20/2012   CLINICAL DATA:  ETT  EXAM: PORTABLE CHEST - 1 VIEW  COMPARISON:  09/19/2012  FINDINGS: Endotracheal tube terminates 5 cm above the carina.  Lungs are clear. No pleural effusion or pneumothorax.  The heart is normal in size.  Stable left subclavian venous catheter and enteric tube.  IMPRESSION: Endotracheal tube terminates 5 cm above the carina.   Electronically Signed   By: Charline Bills M.D.   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 - Cont keppra  - Continue PS trials, no extubation due to mental status.  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 HTN - Echo with grade 2  diastolic dysfunction, thickening and EF of 60-65%. P:  - Tele. - BP control as ordered.   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   Hypokalemia Hyponatremia. P:   - F/u chem  - Replace K.  GASTROINTESTINAL A:   Dysphagia - in setting of AMS / Resp fx P:   - Tol TF. - Pantoprazole for stress ulcer prophylaxis.  HEMATOLOGIC A:   Anemia P:  - Monitor h/h - Transfusion threshold < 7gm/dl  INFECTIOUS A:   Aspiration pneumonia P:   - Unasyn as ordered, last dose on 9/20. - Cultures as above  ENDOCRINE A:   No acute issues P:   - Monitor glucose.  Canary Brim, NP-C [facility] Pgr: [phone] or [phone]   The patient is critically ill with multiple organ systems failure and requires high complexity decision making for assessment and support, frequent evaluation and titration of therapies, application of advanced monitoring technologies and extensive interpretation of multiple databases.   Critical Care Time devoted to patient care services described in this note is --minutes.   Billy Fischer, MD ; [facility] [phone].  After 5:30 PM or weekends, call [phone]   09/21/2012 9:37 AM

## 2012-09-21 NOTE — Progress Notes (Signed)
Patient ID: Sally Nelson, female   DOB: [birth year], 27 y.o.   MRN: [id number] Neurologically stable. Opens eyes spontaneously and occasionally follows commands. EVD open and patent at 15 cm, still draining 8-10 cc per hour on average. This makes me think she is going to need a ventriculoperitoneal shunt and therefore I am going to lower her EVD to 10 cm of water. Continue current management otherwise.

## 2012-09-22 ENCOUNTER — Inpatient Hospital Stay (HOSPITAL_COMMUNITY): Payer: Medicaid Other

## 2012-09-22 DIAGNOSIS — I609 Nontraumatic subarachnoid hemorrhage, unspecified: Secondary | ICD-10-CM

## 2012-09-22 DIAGNOSIS — R633 Feeding difficulties: Secondary | ICD-10-CM

## 2012-09-22 LAB — CBC WITH DIFFERENTIAL/PLATELET
Basophils Absolute: 0 10*3/uL (ref 0.0–0.1)
Eosinophils Absolute: 0.1 10*3/uL (ref 0.0–0.7)
Eosinophils Relative: 1 % (ref 0–5)
HCT: 22.1 % — ABNORMAL LOW (ref 36.0–46.0)
Lymphs Abs: 3.8 10*3/uL (ref 0.7–4.0)
MCHC: 33 g/dL (ref 30.0–36.0)
MCV: 75.4 fL — ABNORMAL LOW (ref 78.0–100.0)
Monocytes Absolute: 0.4 10*3/uL (ref 0.1–1.0)
Monocytes Relative: 3 % (ref 3–12)
Neutro Abs: 8.7 10*3/uL — ABNORMAL HIGH (ref 1.7–7.7)
Platelets: 163 10*3/uL (ref 150–400)
RBC: 2.93 MIL/uL — ABNORMAL LOW (ref 3.87–5.11)
RDW: 19.7 % — ABNORMAL HIGH (ref 11.5–15.5)
WBC: 13 10*3/uL — ABNORMAL HIGH (ref 4.0–10.5)

## 2012-09-22 LAB — BASIC METABOLIC PANEL
BUN: 31 mg/dL — ABNORMAL HIGH (ref 6–23)
CO2: 22 mEq/L (ref 19–32)
Calcium: 9.3 mg/dL (ref 8.4–10.5)
Chloride: 110 mEq/L (ref 96–112)
Creatinine, Ser: 0.53 mg/dL (ref 0.50–1.10)
GFR calc Af Amer: >90 mL/min (ref >90)
GFR calc non Af Amer: >90 mL/min (ref >90)
Glucose, Bld: 125 mg/dL — ABNORMAL HIGH (ref 70–99)
Potassium: 3.5 mEq/L (ref 3.5–5.1)
Sodium: 142 mEq/L (ref 135–145)

## 2012-09-22 LAB — GLUCOSE, CAPILLARY
Glucose-Capillary: 107 mg/dL — ABNORMAL HIGH (ref 70–99)
Glucose-Capillary: 90 mg/dL (ref 70–99)
Glucose-Capillary: 79 mg/dL (ref 70–99)
Glucose-Capillary: 107 mg/dL — ABNORMAL HIGH (ref 70–99)

## 2012-09-22 LAB — GRAM STAIN

## 2012-09-22 LAB — URINALYSIS, ROUTINE W REFLEX MICROSCOPIC
Bilirubin Urine: NEGATIVE
Glucose, UA: NEGATIVE mg/dL
Ketones, ur: NEGATIVE mg/dL
Urobilinogen, UA: 1 mg/dL (ref 0.0–1.0)
pH: 7 (ref 5.0–8.0)

## 2012-09-22 LAB — URINE MICROSCOPIC-ADD ON

## 2012-09-22 MED ORDER — POTASSIUM CHLORIDE 20 MEQ/15ML (10%) PO LIQD
20.0000 meq | ORAL | Status: AC
  Administered 2012-09-22 (×2): 20 meq
  Filled 2012-09-22 (×2): qty 15

## 2012-09-22 MED ORDER — VECURONIUM BROMIDE 10 MG IV SOLR
10.0000 mg | Freq: Once | INTRAVENOUS | Status: AC
  Administered 2012-09-23: 5 mg via INTRAVENOUS
  Filled 2012-09-22: qty 10

## 2012-09-22 MED ORDER — FENTANYL CITRATE 0.05 MG/ML IJ SOLN
200.0000 ug | Freq: Once | INTRAMUSCULAR | Status: AC
  Administered 2012-09-23: 100 ug via INTRAVENOUS

## 2012-09-22 MED ORDER — POTASSIUM CHLORIDE 20 MEQ/15ML (10%) PO LIQD
40.0000 meq | Freq: Three times a day (TID) | ORAL | Status: AC
  Administered 2012-09-22 (×2): 40 meq
  Filled 2012-09-22 (×2): qty 30

## 2012-09-22 MED ORDER — MIDAZOLAM HCL 2 MG/2ML IJ SOLN
4.0000 mg | Freq: Once | INTRAMUSCULAR | Status: AC
  Administered 2012-09-23: 2 mg via INTRAVENOUS

## 2012-09-22 MED ORDER — PIPERACILLIN-TAZOBACTAM 3.375 G IVPB
3.3750 g | Freq: Three times a day (TID) | INTRAVENOUS | Status: DC
  Administered 2012-09-22 – 2012-09-30 (×23): 3.375 g via INTRAVENOUS
  Filled 2012-09-22 (×26): qty 50

## 2012-09-22 MED ORDER — SODIUM CHLORIDE 0.9 % IV SOLN
500.0000 mg | Freq: Three times a day (TID) | INTRAVENOUS | Status: DC
  Administered 2012-09-23 – 2012-09-25 (×8): 500 mg via INTRAVENOUS
  Filled 2012-09-22 (×9): qty 500

## 2012-09-22 MED ORDER — VANCOMYCIN HCL IN DEXTROSE 750-5 MG/150ML-% IV SOLN
750.0000 mg | Freq: Once | INTRAVENOUS | Status: AC
  Administered 2012-09-22: 750 mg via INTRAVENOUS
  Filled 2012-09-22 (×2): qty 150

## 2012-09-22 MED ORDER — PROPOFOL 10 MG/ML IV EMUL: 5.0000 ug/kg/min | Freq: Once | INTRAVENOUS | Status: DC

## 2012-09-22 MED ORDER — ETOMIDATE 2 MG/ML IV SOLN
40.0000 mg | Freq: Once | INTRAVENOUS | Status: AC
  Administered 2012-09-23: 20 mg via INTRAVENOUS
  Filled 2012-09-22: qty 20

## 2012-09-22 MED ORDER — SODIUM CHLORIDE 0.9 % IV SOLN
INTRAVENOUS | Status: DC
  Administered 2012-09-22 – 2012-10-27 (×20): via INTRAVENOUS

## 2012-09-22 NOTE — Progress Notes (Signed)
UR completed.  Chantalle Defilippo, RN BSN [facility] CCM Trauma/Neuro ICU Case Manager [phone]  

## 2012-09-22 NOTE — Progress Notes (Signed)
eLink Nursing ICU Electrolyte Replacement Protocol  Patient Name: Cyndee Giammarco DOB: 09-11-1985 MRN: [id number]  Date of Service  09/22/2012   HPI/Events of Note    Recent Labs Lab 09/17/12 0200  09/18/12 0300  09/19/12 0200  09/20/12 0214  09/20/12 1438 09/20/12 2000 09/21/12 0145 09/21/12 0820 09/22/12 0540  NA 154*  < > 154*  < > 156*  < > 152*  < > 150* 151* 147* 146* 142  K 4.3  --  3.5  --  4.2  --  3.6  --   --   --  3.2*  --  3.5  CL 120*  --  118*  --  122*  --  117*  --   --   --  113*  --  110  CO2 27  --  28  --  26  --  27  --   --   --  24  --  22  GLUCOSE 140*  --  156*  --  131*  --  125*  --   --   --  114*  --  125*  BUN 15  --  24*  --  33*  --  33*  --   --   --  32*  --  31*  CREATININE 0.55  --  0.49*  --  0.62  --  0.52  --   --   --  0.55  --  0.53  CALCIUM 10.0  --  9.5  --  9.5  --  9.2  --   --   --  9.5  --  9.3  MG 2.3  --  2.4  --  2.5  --  2.3  --   --   --  2.1  --   --   PHOS 3.3  --  4.6  --  4.9*  --  4.2  --   --   --  4.1  --   --   < > = values in this interval not displayed.  Estimated Creatinine Clearance: 81.8 ml/min (by C-G formula based on Cr of 0.53).  Intake/Output     09/21 0701 - 09/22 0700   I.V. (mL/kg) 2000 (39.1)   NG/GT 1350   IV Piggyback 310   Total Intake(mL/kg) 3660 (71.5)   Urine (mL/kg/hr) 1495 (1.2)   Drains 160 (0.1)   Stool 150 (0.1)   Total Output 1805   Net +1855        - I/O DETAILED x24h    Total I/O In: 1565 [I.V.:800; NG/GT:660; IV Piggyback:105] Out: 507 [Urine:440; Drains:67] - I/O THIS SHIFT    ASSESSMENT   eICURN Interventions  K+ 3.5 Electrolyte protocol criteria met.  Replace per protocol.  MD notified   ASSESSMENT: MAJOR ELECTROLYTE    Merita Norton 09/22/2012, 6:29 AM

## 2012-09-22 NOTE — Consult Note (Signed)
  Asked by Dr. Molli Knock to evaluate this patient for possible PEG tomorrow.  I have briefly examined the patient.  She has a bone flap on the rught upper part of her abdomen which should not get in the way of a PEG.  Dr. Janee Morn is available for PEG placement tomorrow.  I have set this up tentatively for 2:00 PM tomorrow.  Will make Dr. Janee Morn aware.  Marta Lamas. Gae Bon, MD, FACS [phone] [facility]

## 2012-09-22 NOTE — Progress Notes (Signed)
Transcranial Doppler  Date POD PCO2 HCT BP  MCA ACA PCA OPHT SIPH VERT Basilar  09/15/12 MS    131/78 Right  Left   59  69   -68  -74   33  20   14  14    -24  46   -26  *     *    09/17/12 MS     Right  Left   42  35   -22  -25   25  29   21  19   26   -40   -21  -25     -19    09/19/12 MS     Right  Left   132  35   -33  -40   12  -25    16  26   22   44   -22  -24     -24    09/22/12 MS      Right  Left   150  126   -92  -31   13  41   23  20   *  51   -22  -30   -24            Right  Left                                            Right  Left                                            Right  Left                                        MCA = Middle Cerebral Artery      OPHT = Opthalmic Artery     BASILAR = Basilar Artery   ACA = Anterior Cerebral Artery     SIPH = Carotid Siphon PCA = Posterior Cerebral Artery   VERT = Verterbral Artery                   Normal MCA = 62+\-12 ACA = 50+\-12 PCA = 42+\-23   * Unable to insonate.   09/22/2012 4:22 PM Gertie Fey, RVT, RDCS, RDMS

## 2012-09-22 NOTE — Progress Notes (Signed)
Pt seen and examined. No issues overnight.  EXAM: Temp:  [97.9 F (36.6 C)-102.7 F (39.3 C)] 97.9 F (36.6 C) (09/22 0700) Pulse Rate:  [92-120] 96 (09/22 1100) Resp:  [14-28] 25 (09/22 1100) BP: (99-138)/(51-87) 118/70 mmHg (09/22 1100) SpO2:  [93 %-100 %] 100 % (09/22 1100) FiO2 (%):  [30 %] 30 % (09/22 0802) Weight:  [54 kg (119 lb 0.8 oz)] 54 kg (119 lb 0.8 oz) (09/22 0500) Intake/Output     09/21 0701 - 09/22 0700 09/22 0701 - 09/23 0700   I.V. (mL/kg) 2400 (44.4) 400 (7.4)   Other  80   NG/GT 1780 200   IV Piggyback 310    Total Intake(mL/kg) 4490 (83.1) 680 (12.6)   Urine (mL/kg/hr) 1795 (1.4) 440 (1.8)   Drains 197 (0.2) 39 (0.2)   Stool 150 (0.1)    Total Output 2142 479   Net +2348 +201         EVD patent at 10, bloody CSF 197cc x 24hrs Awake, alert, intubated Intubated, breathing over vent Follows commands RUE/RLE Moves LLE, LUE spontaneously, not FC Wounds c/d/i  LABS: Lab Results  Component Value Date   CREATININE 0.53 09/22/2012   BUN 31* 09/22/2012   NA 142 09/22/2012   K 3.5 09/22/2012   CL 110 09/22/2012   CO2 22 09/22/2012   Lab Results  Component Value Date   WBC 13.0* 09/22/2012   HGB 7.3* 09/22/2012   HCT 22.1* 09/22/2012   MCV 75.4* 09/22/2012   PLT 163 09/22/2012    IMAGING: No new imaging overnight  IMPRESSION: - 27 y.o. female SAH d# 11, S/P RMCA aneurysm clipping - Neurologically stable with left hemiplegia - VDFR  PLAN: - Cont close neurologic observation. 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

## 2012-09-22 NOTE — Progress Notes (Signed)
eLink Physician-Brief Progress Note Patient Name: Sally Nelson DOB: [birth year] MRN: [id number]  Date of Service  09/22/2012   HPI/Events of Note   Fever 102  eICU Interventions  Pan cx, empiric abx   Intervention Category Intermediate Interventions: Other:  ALVA,RAKESH V. 09/22/2012, 5:23 PM

## 2012-09-22 NOTE — Progress Notes (Signed)
ANTIBIOTIC CONSULT NOTE - INITIAL  Pharmacy Consult:  Vancomycin / Zosyn Indication:  Empiric for fever  Allergies  Allergen Reactions  . Contrast Media [Iodinated Diagnostic Agents] Hives    Patient Measurements: Height: 5\' 1"  (154.9 cm) Weight: 119 lb 0.8 oz (54 kg) IBW/kg (Calculated) : 47.8  Vital Signs: Temp: 101.9 F (38.8 C) (09/22 1531) Temp src: Axillary (09/22 1531) BP: 129/70 mmHg (09/22 1626) Pulse Rate: 127 (09/22 1700) Intake/Output from previous day: 09/21 0701 - 09/22 0700 In: 4490 [I.V.:2400; NG/GT:1780; IV Piggyback:310] Out: 2142 [Urine:1795; Drains:197; Stool:150] Intake/Output from this shift: Total I/O In: 1850 [I.V.:1000; Other:250; NG/GT:500; IV Piggyback:100] Out: 1252 [Urine:1200; Drains:52]  Labs:  Recent Labs  09/20/12 0214 09/21/12 0145 09/22/12 0540  WBC 12.4* 11.3* 13.0*  HGB 7.9* 7.0* 7.3*  PLT 195 154 163  CREATININE 0.52 0.55 0.53   Estimated Creatinine Clearance: 81.8 ml/min (by C-G formula based on Cr of 0.53). No results found for this basename: VANCOTROUGH, Leodis Binet, VANCORANDOM, GENTTROUGH, GENTPEAK, GENTRANDOM, TOBRATROUGH, TOBRAPEAK, TOBRARND, AMIKACINPEAK, AMIKACINTROU, AMIKACIN,  in the last 72 hours   Microbiology: Recent Results (from the past 720 hour(s))  MRSA PCR SCREENING     Status: None   Collection Time    09/11/12  1:32 PM      Result Value Range Status   MRSA by PCR NEGATIVE  NEGATIVE Final   Comment:            The GeneXpert MRSA Assay (FDA     approved for NASAL specimens     only), is one component of a     comprehensive MRSA colonization     surveillance program. It is not     intended to diagnose MRSA     infection nor to guide or     monitor treatment for     MRSA infections.  CULTURE, BLOOD (ROUTINE X 2)     Status: None   Collection Time    09/12/12  3:48 AM      Result Value Range Status   Specimen Description BLOOD RIGHT ANTECUBITAL   Final   Special Requests BOTTLES DRAWN AEROBIC ONLY  5CC   Final   Culture  Setup Time     Final   Value: 09/12/2012 11:37     Performed at [facility]   Culture     Final   Value: NO GROWTH 5 DAYS     Performed at [facility]   Report Status 09/18/2012 FINAL   Final  CULTURE, BLOOD (ROUTINE X 2)     Status: None   Collection Time    09/12/12  4:00 AM      Result Value Range Status   Specimen Description BLOOD RIGHT HAND   Final   Special Requests BOTTLES DRAWN AEROBIC ONLY 5CC   Final   Culture  Setup Time     Final   Value: 09/12/2012 11:35     Performed at [facility]   Culture     Final   Value: NO GROWTH 5 DAYS     Performed at [facility]   Report Status 09/18/2012 FINAL   Final  CULTURE, RESPIRATORY (NON-EXPECTORATED)     Status: None   Collection Time    09/12/12  8:15 AM      Result Value Range Status   Specimen Description TRACHEAL ASPIRATE   Final   Special Requests NONE   Final   Gram Stain     Final   Value:  NO WBC SEEN     NO SQUAMOUS EPITHELIAL CELLS SEEN     RARE GRAM NEGATIVE COCCI     Performed at [facility]   Culture     Final   Value: Non-Pathogenic Oropharyngeal-type Flora Isolated.     Performed at [facility]   Report Status 09/14/2012 FINAL   Final  CLOSTRIDIUM DIFFICILE BY PCR     Status: None   Collection Time    09/14/12 10:52 PM      Result Value Range Status   C difficile by pcr NEGATIVE  NEGATIVE Final  URINE CULTURE     Status: None   Collection Time    09/17/12  5:53 PM      Result Value Range Status   Specimen Description URINE, CATHETERIZED   Final   Special Requests Unasyn Normal   Final   Culture  Setup Time     Final   Value: 09/17/2012 18:49     Performed at [facility] Count     Final   Value: NO GROWTH     Performed at [facility]   Culture     Final   Value: NO GROWTH     Performed at [facility]   Report Status 09/19/2012 FINAL   Final  CSF CULTURE     Status: None   Collection  Time    09/22/12  8:05 AM      Result Value Range Status   Specimen Description CSF   Final   Special Requests NONE   Final   Gram Stain     Final   Value: CYTOSPIN WBC PRESENT,BOTH PMN AND MONONUCLEAR     NO ORGANISMS SEEN     Performed at [facility]     Performed at [facility]   Culture PENDING   Incomplete   Report Status PENDING   Incomplete  GRAM STAIN     Status: None   Collection Time    09/22/12  8:05 AM      Result Value Range Status   Specimen Description CSF   Final   Special Requests 2.5CC   Final   Gram Stain     Final   Value: CYTOSPIN SLIDE:     WBC PRESENT,BOTH PMN AND MONONUCLEAR     NO ORGANISMS SEEN   Report Status 09/22/2012 FINAL   Final    Medical History: Past Medical History  Diagnosis Date  . Heart murmur   . Asthma        Assessment: 26 YOM admitted 09/11/12 for [facility] and RMCA aneurysm, 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  She was previously on Unasyn for aspiration PNA, now to start vancomycin and Zosyn as empiric therapy for fever.  Patient's renal function has been stable.  Vanc 9/22 >> Zosyn 9/22 >> Unasyn 9/12 >> 9/21  9/12 BCx - negative 9/12 RCx - normal oral flora 9/14 Cdiff - negative 9/17 UCx - negative 9/22 CSF - negative 9/22 BCx - pending   Goal of Therapy:  Vancomycin trough level 15-20 mcg/ml   Plan:  - Vanc 750gm IV x 1, then 500mg  IV Q8H - Zosyn 3.375gm IV Q8H, 4 hr infusion - Monitor renal fxn, clinical course, vanc trough at Css    Nakyra Bourn D. Laney Potash, PharmD, BCPS Pager:  [phone]  09/22/2012, 5:42 PM

## 2012-09-22 NOTE — Progress Notes (Signed)
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 Following commands.  No events overnight.  Spoke with father extensively today, informed him that the patient will likely need trach but will need to speak with NS prior to proceeding further with either extubation or trach/peg to determine prognosis.  Father is ok with whatever recommendations we make. 9/20 - Waxing and waning mental status. More drowsy. Doing SBT but not extubation candidate     SUBJECTIVE/OVERNIGHT/INTERVAL HX:  RN reports no acute events, weaning on PSV but no change in mental status  VITAL SIGNS: Temp:  [97.9 F (36.6 C)-102.7 F (39.3 C)] 97.9 F (36.6 C) (09/22 0700) Pulse Rate:  [92-120] 109 (09/22 0900) Resp:  [14-28] 26 (09/22 0900) BP: (99-138)/(51-87) 127/63 mmHg (09/22 0900) SpO2:  [93 %-100 %] 99 % (09/22 0900) FiO2 (%):  [30 %] 30 % (09/22 0802) Weight:  [54 kg (119 lb 0.8 oz)] 54 kg (119 lb 0.8 oz) (09/22 0500)  VENTILATOR SETTINGS: Vent Mode:  [-] PSV;CPAP FiO2 (%):  [30 %] 30 % Set Rate:  [14 bmp] 14 bmp Vt Set:  [500 mL] 500 mL PEEP:  [5 cmH20] 5 cmH20 Pressure Support:  [8 cmH20] 8 cmH20 Plateau  Pressure:  [20 cmH20] 20 cmH20  INTAKE / OUTPUT: Intake/Output     09/21 0701 - 09/22 0700 09/22 0701 - 09/23 0700   I.V. (mL/kg) 2400 (44.4) 200 (3.7)   Other  30   NG/GT 1780 100   IV Piggyback 310    Total Intake(mL/kg) 4490 (83.1) 330 (6.1)   Urine (mL/kg/hr) 1795 (1.4) 180 (1.4)   Drains 197 (0.2) 12 (0.1)   Stool 150 (0.1)    Total Output 2142 192   Net +2348 +138         PHYSICAL EXAMINATION: Gen: young female, NAD on vent  HEENT: scalp dressing in place, drain in place, ETT in place PULM: resps even non labored on PSV, coarse, good air entry CV: RRR, loud diastolic/systolic murmur, no JVD AB: BS+, soft, nontender, no hsm Ext: warm, no edema, no clubbing, no cyanosis Derm: no rash or skin breakdown Neuro: eyes open, no response to verbal command, occ follow commands  LABS: PULMONARY  Recent Labs Lab 09/16/12 0443 09/17/12 0357 09/18/12 0508  PHART 7.445 7.414 7.418  PCO2ART 38.5 41.3 38.1  PO2ART 162.0* 147.0* 121.0*  HCO3 26.0* 25.9* 24.2*  TCO2 27.2 27.2 25.4  O2SAT 99.2 99.0 98.7   CBC  Recent Labs Lab 09/20/12 0214 09/21/12 0145 09/22/12 0540  HGB 7.9* 7.0* 7.3*  HCT 24.1* 21.4* 22.1*  WBC 12.4* 11.3* 13.0*  PLT 195 154 163   CHEMISTRY  Recent Labs Lab 09/17/12 0200  09/18/12 0300  09/19/12 0200  09/20/12 0214  09/20/12 1438 09/20/12 2000 09/21/12 0145 09/21/12 0820 09/22/12 0540  NA 154*  < > 154*  < > 156*  < > 152*  < > 150* 151* 147* 146* 142  K 4.3  --  3.5  --  4.2  --  3.6  --   --   --  3.2*  --  3.5  CL 120*  --  118*  --  122*  --  117*  --   --   --  113*  --  110  CO2 27  --  28  --  26  --  27  --   --   --  24  --  22  GLUCOSE 140*  --  156*  --  131*  --  125*  --   --   --  114*  --  125*  BUN 15  --  24*  --  33*  --  33*  --   --   --  32*  --  31*  CREATININE 0.55  --  0.49*  --  0.62  --  0.52  --   --   --  0.55  --  0.53  CALCIUM 10.0  --  9.5  --  9.5  --  9.2  --   --   --  9.5  --  9.3  MG 2.3  --  2.4  --   2.5  --  2.3  --   --   --  2.1  --   --   PHOS 3.3  --  4.6  --  4.9*  --  4.2  --   --   --  4.1  --   --   < > = values in this interval not displayed. Estimated Creatinine Clearance: 81.8 ml/min (by C-G formula based on Cr of 0.53).  ENDOCRINE CBG (last 3)   Recent Labs  09/21/12 0009 09/21/12 0353 09/21/12 0725  GLUCAP 107* 102* 102*    IMAGING x48h  Dg Chest Port 1 View  09/22/2012   CLINICAL DATA:  Intubated patient.  EXAM: PORTABLE CHEST - 1 VIEW  COMPARISON:  Single view of the chest 09/21/2012 and 09/2012.  FINDINGS: The patient is rotated on this study. Support tubes and lines are unchanged. Lungs appear clear. Heart size is normal. No pneumothorax or pleural fluid.  IMPRESSION: No acute disease.   Electronically Signed   By: Drusilla Kanner M.D.   On: 09/22/2012 06:15   Dg Chest Port 1 View  09/21/2012   CLINICAL DATA:  Respiratory difficulty  EXAM: PORTABLE CHEST - 1 VIEW  COMPARISON:  09/20/2012  FINDINGS: The heart is normal in size. Endotracheal tube, NG tube, left subclavian central venous catheter is stable. Hyperaeration stable. No pneumothorax.  IMPRESSION: Stable exam. Clear lungs.   Electronically Signed   By: Maryclare Bean M.D.   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  Will likely need a VP shunt. - Cont keppra. - Continue PS trials, no extubation due to mental status.  PULMONARY A:  Post op respiratory failure > good vent mechanics/oxygenation CAP vs aspiration pneumonia (favor aspiration) - improving CXR Asthma - without exacerbation P:   - PS wean as tol -  No extubation due to mental status. Janina Mayo on 9/23 at 1 PM.  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 HTN - Echo with grade 2 diastolic dysfunction, thickening and EF of  60-65%. P:  - Tele. - BP control as ordered.  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   Hypokalemia Hyponatremia. P:   - F/u chem  - Replace K.  GASTROINTESTINAL A:   Dysphagia - in setting of AMS / Resp fx P:   - Tol TF, continue. - Pantoprazole for stress ulcer prophylaxis.  HEMATOLOGIC A:   Anemia P:  - Monitor h/h - Transfusion threshold < 7gm/dl  INFECTIOUS A:   Aspiration pneumonia.  Febrile overnight. P:   - Unasyn course complete. - Cultures as above - F/U CSF culture.   - If febrile again will start broad spectrum abx.  ENDOCRINE A:   Single episode of hypoglycemia. P:   - Monitor glucose.  The patient is critically ill with multiple organ systems failure and requires high complexity decision making for assessment and support, frequent evaluation and titration of therapies, application of advanced monitoring technologies and extensive interpretation of multiple databases.   CC time 35 minutes.  Alyson Reedy, M.D. [facility] Pulmonary/Critical Care Medicine. Pager: [phone]. After hours pager: [phone].

## 2012-09-23 ENCOUNTER — Inpatient Hospital Stay (HOSPITAL_COMMUNITY): Payer: Medicaid Other

## 2012-09-23 LAB — CBC WITH DIFFERENTIAL/PLATELET
Basophils Relative: 0 % (ref 0–1)
Eosinophils Absolute: 0 10*3/uL (ref 0.0–0.7)
HCT: 17.8 % — ABNORMAL LOW (ref 36.0–46.0)
Hemoglobin: 6.1 g/dL — CL (ref 12.0–15.0)
Lymphocytes Relative: 23 % (ref 12–46)
Lymphs Abs: 6 10*3/uL — ABNORMAL HIGH (ref 0.7–4.0)
MCH: 26.1 pg (ref 26.0–34.0)
MCHC: 34.3 g/dL (ref 30.0–36.0)
MCV: 76.1 fL — ABNORMAL LOW (ref 78.0–100.0)
Monocytes Relative: 3 % (ref 3–12)
Neutro Abs: 19.5 10*3/uL — ABNORMAL HIGH (ref 1.7–7.7)
RDW: 20.8 % — ABNORMAL HIGH (ref 11.5–15.5)

## 2012-09-23 LAB — D-DIMER, QUANTITATIVE: D-Dimer, Quant: 3.33 ug/mL-FEU — ABNORMAL HIGH (ref 0.00–0.48)

## 2012-09-23 LAB — BASIC METABOLIC PANEL
BUN: 31 mg/dL — ABNORMAL HIGH (ref 6–23)
BUN: 32 mg/dL — ABNORMAL HIGH (ref 6–23)
CO2: 21 mEq/L (ref 19–32)
Calcium: 9.5 mg/dL (ref 8.4–10.5)
Creatinine, Ser: 0.54 mg/dL (ref 0.50–1.10)
GFR calc non Af Amer: >90 mL/min (ref >90)
GFR calc non Af Amer: >90 mL/min (ref >90)
Glucose, Bld: 110 mg/dL — ABNORMAL HIGH (ref 70–99)
Glucose, Bld: 122 mg/dL — ABNORMAL HIGH (ref 70–99)
Potassium: 4.1 mEq/L (ref 3.5–5.1)
Potassium: 3.8 mEq/L (ref 3.5–5.1)
Sodium: 140 mEq/L (ref 135–145)

## 2012-09-23 LAB — BLOOD GAS, ARTERIAL
Acid-base deficit: 1.2 mmol/L (ref 0.0–2.0)
Bicarbonate: 21.4 mEq/L (ref 20.0–24.0)
FIO2: 0.3 %
MECHVT: 500 mL
PEEP: 5 cmH2O
Patient temperature: 99.9
TCO2: 22.2 mmol/L (ref 0–100)
pH, Arterial: 7.509 — ABNORMAL HIGH (ref 7.350–7.450)

## 2012-09-23 LAB — POCT I-STAT 3, ART BLOOD GAS (G3+)
Bicarbonate: 22.4 mEq/L (ref 20.0–24.0)
Patient temperature: 99.9
TCO2: 23 mmol/L (ref 0–100)
pCO2 arterial: 31.8 mmHg — ABNORMAL LOW (ref 35.0–45.0)
pH, Arterial: 7.46 — ABNORMAL HIGH (ref 7.350–7.450)

## 2012-09-23 LAB — CBC
HCT: 17.8 % — ABNORMAL LOW (ref 36.0–46.0)
Hemoglobin: 8.3 g/dL — ABNORMAL LOW (ref 12.0–15.0)
Hemoglobin: 6 g/dL — CL (ref 12.0–15.0)
MCH: 24.9 pg — ABNORMAL LOW (ref 26.0–34.0)
MCHC: 33.2 g/dL (ref 30.0–36.0)
MCV: 75.4 fL — ABNORMAL LOW (ref 78.0–100.0)
Platelets: 144 10*3/uL — ABNORMAL LOW (ref 150–400)
RDW: 20 % — ABNORMAL HIGH (ref 11.5–15.5)
RDW: 21.1 % — ABNORMAL HIGH (ref 11.5–15.5)
WBC: 13 10*3/uL — ABNORMAL HIGH (ref 4.0–10.5)
WBC: 28.7 10*3/uL — ABNORMAL HIGH (ref 4.0–10.5)

## 2012-09-23 LAB — GLUCOSE, CAPILLARY
Glucose-Capillary: 104 mg/dL — ABNORMAL HIGH (ref 70–99)
Glucose-Capillary: 96 mg/dL (ref 70–99)

## 2012-09-23 LAB — PREPARE RBC (CROSSMATCH)

## 2012-09-23 LAB — MAGNESIUM: Magnesium: 2.1 mg/dL (ref 1.5–2.5)

## 2012-09-23 LAB — TROPONIN I: Troponin I: <0.3 ng/mL (ref <0.30)

## 2012-09-23 SURGERY — Surgical Case
Anesthesia: *Unknown

## 2012-09-23 MED ORDER — NOREPINEPHRINE BITARTRATE 1 MG/ML IJ SOLN
2.0000 ug/min | INTRAVENOUS | Status: DC
  Administered 2012-09-23: 40 ug/min via INTRAVENOUS
  Administered 2012-09-24 – 2012-09-26 (×8): 50 ug/min via INTRAVENOUS
  Administered 2012-09-26 – 2012-09-27 (×2): 25 ug/min via INTRAVENOUS
  Filled 2012-09-23 (×14): qty 16

## 2012-09-23 MED ORDER — DIPHENHYDRAMINE HCL 50 MG PO CAPS
50.0000 mg | ORAL_CAPSULE | Freq: Once | ORAL | Status: AC
  Administered 2012-09-23: 50 mg via ORAL
  Filled 2012-09-23: qty 1

## 2012-09-23 MED ORDER — PREDNISONE 50 MG PO TABS
50.0000 mg | ORAL_TABLET | Freq: Once | ORAL | Status: DC
  Filled 2012-09-23: qty 1

## 2012-09-23 MED ORDER — NOREPINEPHRINE BITARTRATE 1 MG/ML IJ SOLN
2.0000 ug/min | INTRAVENOUS | Status: DC
  Administered 2012-09-23: 2 ug/min via INTRAVENOUS
  Filled 2012-09-23: qty 4

## 2012-09-23 MED ORDER — MIDAZOLAM HCL 2 MG/2ML IJ SOLN
2.0000 mg | Freq: Once | INTRAMUSCULAR | Status: AC
  Administered 2012-09-23: 2 mg via INTRAVENOUS

## 2012-09-23 MED ORDER — PREDNISONE 50 MG PO TABS
50.0000 mg | ORAL_TABLET | Freq: Once | ORAL | Status: AC
Start: 2012-09-24 — End: 2012-09-24
  Administered 2012-09-24: 50 mg via ORAL
  Filled 2012-09-23: qty 1

## 2012-09-23 MED ORDER — VITAL AF 1.2 CAL PO LIQD
1000.0000 mL | ORAL | Status: DC
  Administered 2012-09-24 – 2012-09-25 (×2): 1000 mL
  Filled 2012-09-23 (×4): qty 1000

## 2012-09-23 MED ORDER — FENTANYL CITRATE 0.05 MG/ML IJ SOLN
50.0000 ug | Freq: Once | INTRAMUSCULAR | Status: AC
  Administered 2012-09-23: 50 ug via INTRAVENOUS

## 2012-09-23 MED ORDER — PREDNISONE 50 MG PO TABS
50.0000 mg | ORAL_TABLET | Freq: Once | ORAL | Status: AC
  Administered 2012-09-23: 50 mg via ORAL
  Filled 2012-09-23: qty 1

## 2012-09-23 MED ORDER — VECURONIUM BROMIDE 10 MG IV SOLR
INTRAVENOUS | Status: AC
  Filled 2012-09-23: qty 10

## 2012-09-23 MED ORDER — VECURONIUM BROMIDE 10 MG IV SOLR
10.0000 mg | Freq: Once | INTRAVENOUS | Status: DC
  Filled 2012-09-23: qty 10

## 2012-09-23 MED ORDER — VECURONIUM BROMIDE 10 MG IV SOLR
10.0000 mg | Freq: Once | INTRAVENOUS | Status: AC
  Administered 2012-09-23: 10 mg via INTRAVENOUS
  Filled 2012-09-23: qty 10

## 2012-09-23 MED ORDER — FENTANYL CITRATE 0.05 MG/ML IJ SOLN
INTRAMUSCULAR | Status: AC
  Filled 2012-09-23: qty 4

## 2012-09-23 MED ORDER — PREDNISONE 10 MG PO TABS
10.0000 mg | ORAL_TABLET | Freq: Once | ORAL | Status: DC
  Filled 2012-09-23: qty 1

## 2012-09-23 MED ORDER — MIDAZOLAM HCL 2 MG/2ML IJ SOLN
INTRAMUSCULAR | Status: AC
  Filled 2012-09-23: qty 6

## 2012-09-23 MED ORDER — FENTANYL CITRATE 0.05 MG/ML IJ SOLN
INTRAMUSCULAR | Status: AC
  Filled 2012-09-23: qty 2

## 2012-09-23 MED ORDER — MIDAZOLAM HCL 2 MG/2ML IJ SOLN
INTRAMUSCULAR | Status: AC
  Filled 2012-09-23: qty 2

## 2012-09-23 MED ORDER — PREDNISONE 50 MG PO TABS: 50.0000 mg | ORAL_TABLET | Freq: Four times a day (QID) | ORAL | Status: DC

## 2012-09-23 NOTE — Progress Notes (Signed)
Orthopedic Tech Progress Note Patient Details:  Sally Nelson Jun 13, 1985 [id number]  Ortho Devices Type of Ortho Device: Abdominal binder Ortho Device/Splint Location: abdomen Ortho Device/Splint Interventions: Sally Nelson, Sally Nelson 09/23/2012, 3:37 PM

## 2012-09-23 NOTE — Procedures (Signed)
Bronchoscopy Procedure Note  Date of Operation: 09/23/2012  Pre-op Diagnosis: Trach  Post-op Diagnosis: same   Surgeon: Shan Levans  Anesthesia: Monitored Local Anesthesia with Sedation, paralytics  Operation: Flexible fiberoptic bronchoscopy, diagnostic   Findings: No endobronchial lesions  Specimen: none  Estimated Blood Loss: none  Complications: none  Indications and History: The patient is a 27 y.o. female with vent dependent.    A FOB was done. No endobronchial lesions. Scope used to help with tracheostomy placement. No specimens   Attestation: I performed the procedure.  Luisa Hart WrightMD

## 2012-09-23 NOTE — Progress Notes (Signed)
Patient ID: Sally Nelson, female   DOB: [birth year], 27 y.o.   MRN: [id number]  Pt seen and examined. No issues overnight.   EXAM: Temp:  [98.5 F (36.9 C)-101.9 F (38.8 C)] 100.5 F (38.1 C) (09/23 0800) Pulse Rate:  [95-211] 100 (09/23 0800) Resp:  [15-42] 15 (09/23 0800) BP: (92-131)/(44-85) 124/80 mmHg (09/23 0800) SpO2:  [94 %-100 %] 100 % (09/23 0800) FiO2 (%):  [30 %] 30 % (09/23 0803) Weight:  [52.9 kg (116 lb 10 oz)] 52.9 kg (116 lb 10 oz) (09/23 0456) Intake/Output     09/22 0701 - 09/23 0700 09/23 0701 - 09/24 0700   I.V. (mL/kg) 2300 (43.5) 100 (1.9)   Other 250    NG/GT 880 100   IV Piggyback 555    Total Intake(mL/kg) 3985 (75.3) 200 (3.8)   Urine (mL/kg/hr) 2260 (1.8) 190 (2.2)   Drains 138 (0.1) 10 (0.1)   Stool 700 (0.6)    Total Output 3098 200   Net +887 0         EVD in place, bloody csf, 138cc x 24 hrs Awake, alert, intubated Intubated, breathing over vent Follows commands RUE/RLE Moves LLE, LUE spontaneously, not FC Wounds c/d/i  LABS: Lab Results  Component Value Date   CREATININE 0.54 09/23/2012   BUN 31* 09/23/2012   NA 138 09/23/2012   K 4.1 09/23/2012   CL 105 09/23/2012   CO2 22 09/23/2012   Lab Results  Component Value Date   WBC 13.0* 09/23/2012   HGB 8.3* 09/23/2012   HCT 25.0* 09/23/2012   MCV 75.1* 09/23/2012   PLT 144* 09/23/2012    IMAGING: TCD velocities reviewed, increased bilateral MCA velocities seen  IMPRESSION: - 27 y.o. female SAH d# 12 , s/p RMCA aneurysm clipping and craniectomy - TCD velocities trending up in bilateral MCA without clinical change - VDRF  PLAN: - Cont EVD at 10, remains bloody csf - Increase IVF to 125cc/hr, cont nimotop/zocor - Plan on Trach/PEG today. - Febrile, cx sent, CSF counts not indicative of infection. Currently on epiric Vanc/Zosyn - electrolyte replacement per protocol

## 2012-09-23 NOTE — Progress Notes (Signed)
Patient ID: Sally Nelson, female   DOB: 1985-03-29, 27 y.o.   MRN: [id number] 11 Days Post-Op  Subjective: On vent  Objective: Vital signs in last 24 hours: Temp:  [98.5 F (36.9 C)-101.9 F (38.8 C)] 98.5 F (36.9 C) (09/23 0433) Pulse Rate:  [95-211] 100 (09/23 0700) Resp:  [15-42] 15 (09/23 0700) BP: (92-131)/(44-85) 123/81 mmHg (09/23 0700) SpO2:  [94 %-100 %] 100 % (09/23 0700) FiO2 (%):  [30 %] 30 % (09/23 0337) Weight:  [52.9 kg (116 lb 10 oz)] 52.9 kg (116 lb 10 oz) (09/23 0456) Last BM Date: 09/20/12  Intake/Output from previous day: 09/22 0701 - 09/23 0700 In: 3985 [I.V.:2300; NG/GT:880; IV Piggyback:555] Out: 3098 [Urine:2260; Drains:138; Stool:700] Intake/Output this shift:    General appearance: no distress Resp: clear to auscultation bilaterally Cardio: regular rate and rhythm GI: soft, NT, flap R abdomen Neuro: opens eyes to voice, moves R toes to command  Lab Results: CBC   Recent Labs  09/22/12 0540 09/23/12 0455  WBC 13.0* 13.0*  HGB 7.3* 8.3*  HCT 22.1* 25.0*  PLT 163 144*   BMET  Recent Labs  09/22/12 0540 09/23/12 0455  NA 142 138  K 3.5 4.1  CL 110 105  CO2 22 22  GLUCOSE 125* 110*  BUN 31* 31*  CREATININE 0.53 0.54  CALCIUM 9.3 9.5   PT/INR No results found for this basename: LABPROT, INR,  in the last 72 hours ABG  Recent Labs  09/23/12 0354  PHART 7.460*  HCO3 22.4    Studies/Results: Dg Chest Port 1 View  09/23/2012   *RADIOLOGY REPORT*  Clinical Data: ET tube placement.  PORTABLE CHEST - 1 VIEW  Comparison: 09/22/2012.  Findings: Unchanged endotracheal tube and central venous catheter. Nasogastric tube remains in the stomach.  No pneumothorax.  Early right base opacity is more apparent on today's film due to lack of rotation.  No effusion.  Left lung clear.  Negative osseous structures.  IMPRESSION: Suspect early right lower lobe infiltrate.  Tubes and lines satisfactory position.   Original Report Authenticated By:  Davonna Belling, M.D.   Dg Chest Port 1 View  09/22/2012   CLINICAL DATA:  Intubated patient.  EXAM: PORTABLE CHEST - 1 VIEW  COMPARISON:  Single view of the chest 09/21/2012 and 09/2012.  FINDINGS: The patient is rotated on this study. Support tubes and lines are unchanged. Lungs appear clear. Heart size is normal. No pneumothorax or pleural fluid.  IMPRESSION: No acute disease.   Electronically Signed   By: Drusilla Kanner M.D.   On: 09/22/2012 06:15    Anti-infectives: Anti-infectives   Start     Dose/Rate Route Frequency Ordered Stop   09/23/12 0200  vancomycin (VANCOCIN) 500 mg in sodium chloride 0.9 % 100 mL IVPB     500 mg 100 mL/hr over 60 Minutes Intravenous Every 8 hours 09/22/12 1743     09/22/12 1800  piperacillin-tazobactam (ZOSYN) IVPB 3.375 g     3.375 g 12.5 mL/hr over 240 Minutes Intravenous Every 8 hours 09/22/12 1744     09/22/12 1745  vancomycin (VANCOCIN) IVPB 750 mg/150 ml premix     750 mg 150 mL/hr over 60 Minutes Intravenous  Once 09/22/12 1743 09/22/12 1907   09/12/12 1053  bacitracin 50,000 Units in sodium chloride irrigation 0.9 % 500 mL irrigation  Status:  Discontinued       As needed 09/12/12 1054 09/12/12 1212   09/12/12 0800  Ampicillin-Sulbactam (UNASYN) 3 g in sodium chloride 0.9 %  100 mL IVPB  Status:  Discontinued     3 g 100 mL/hr over 60 Minutes Intravenous Every 8 hours 09/12/12 0152 09/12/12 0746   09/12/12 0800  Ampicillin-Sulbactam (UNASYN) 3 g in sodium chloride 0.9 % 100 mL IVPB  Status:  Discontinued     3 g 100 mL/hr over 60 Minutes Intravenous 4 times per day 09/12/12 0746 09/21/12 1155   09/12/12 0200  Ampicillin-Sulbactam (UNASYN) 3 g in sodium chloride 0.9 % 100 mL IVPB     3 g 100 mL/hr over 60 Minutes Intravenous  Once 09/12/12 0152 09/12/12 0336   09/11/12 2308  ceFAZolin (ANCEF) 1-5 GM-% IVPB    Comments:  Aydelette, Jamie   : cabinet override      09/11/12 2308 09/12/12 1114   09/11/12 1945  bacitracin 50,000 Units in sodium  chloride irrigation 0.9 % 500 mL irrigation  Status:  Discontinued       As needed 09/11/12 2050 09/12/12 0030   09/11/12 1923  ceFAZolin (ANCEF) 2-3 GM-% IVPB SOLR    Comments:  Toney Sang   : cabinet override      09/11/12 1923 09/11/12 2310      Assessment/Plan: s/p Procedure(s): Right decompressive craniectomy and placement of boneflap in right lower quadrant S/P ruptured aneurysm Need for enteral access - for PEG at bedside today, consent obtained yesterday  LOS: 12 days    Violeta Gelinas, MD, MPH, FACS Pager: [phone]  09/23/2012

## 2012-09-23 NOTE — Procedures (Signed)
    Beside Percutaneous Tracheostomy   Consent from family, patient sedated and paralyzed, positioned, area cleaned, lidocaine/epi injected, skin incision done followed by blunt dissection, airway entered and wire passed, visualized bronchoscopically, airway crushed and dilated then size 6 tracheostomy placed, visualized bronchoscopically well above carina. Patient connected to vent through trach with good volume return and stable sats. Trach sutured and post trach orderset filled. Patient tolerated the procedure well without complications.   Alyson Reedy, M.D.  [facility] Pulmonary/Critical Care Medicine.  Pager: [phone].  After hours pager: [phone].

## 2012-09-23 NOTE — Procedures (Signed)
Brief operative note  Preoperative diagnosis: Status post ruptured aneurysm with need for enteral access Postoperative diagnosis: Same Procedure: Esophagogastroduodenoscopy, percutaneous endoscopic gastrostomy tube placement Surgeon: Violeta Gelinas, MD Assistant: Earney Hamburg, [facility]  History of present illness: Patient was admitted with mental status change and was found to have a ruptured aneurysm. She underwent repair of this I neurosurgery. She remains ventilator dependent. She underwent tracheostomy today. She also needs central access. We're proceeding with EGD and PEG placement.  Procedure: Informed consent was obtained from the patient's Father. Patient was identified in the trauma intensive care unit. She remained monitored throughout. Time out procedure was done. She received intravenous pain medication, sedation, and muscle relaxation. Bite block was placed. Esophagogastroduodenoscope Was inserted through the mouth into the esophagus easily. There were no gross esophageal lesions. The scope was advanced into her stomach. The stomach was insufflated. Next, the scope was passed through the pylorus into the first and second portion of the duodenum. No abnormalities were seen there. Next, the scope was pulled back into the stomach. Further insufflation was done.Excellent poke site was visualized. Abdomen was prepped and draped in sterile fashion. Local anesthetic was made. Vertical incision was made at the site of the good poke location. Angiocath was inserted. Guidewire was placed and grasped with the endoscope. Guidewire was pulled out through the mouth. PEG tube was attached to the guidewire.  PEG tube was pulled back out through the abdominal wall. The scope was reinserted down into the stomach. PEG tube positioning was confirmed in a picture was taken. The flange on the tube was placed to allow it to just rotate. Antibiotic ointment was applied at the site. Stomach insufflation was evacuated  and the scope was removed. There were no complications. Patient tolerated the procedure well.  Violeta Gelinas, MD, MPH, FACS Pager: [phone]

## 2012-09-23 NOTE — Progress Notes (Signed)
Dr Conchita Paris called due to pt no longer following commands, withdrawing to pain in RUE only. BP 69/41, O2 95% on 30%FiO2. Orders given for stat CT/CTA head.   Cyndie Chime [place]

## 2012-09-23 NOTE — Progress Notes (Signed)
NUTRITION FOLLOW UP  Intervention:    1.Increase Vital AF 1.2 to 55 ml/hr.   Tube feeding regimen provides 1584 kcal, 99 grams of protein, and 1070 ml of H2O.    Nutrition Dx:   Inadequate oral intake related to inability to eat as evidenced by NPO status; ongoing.    Goal:  Intake to meet >90% of estimated nutrition needs, met.   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 Pt may need VP shunt. Pt unable to be extubated. Plan for trach and PEG today.  Patient is currently intubated on ventilator support.  MV: 8 L/min Temp:Temp (24hrs), Avg:100.2 F (37.9 C), Min:98.5 F (36.9 C), Max:101.9 F (38.8 C) Electrolytes WNL.  Patient has OG tube in place. Vital AF 1.2 is infusing @ 50 ml/hr.  Tube feeding regimen currently providing 1440 kcal, 90 grams protein, and 973 ml H2O.   Residuals: 0   Height: Ht Readings from Last 1 Encounters:  09/11/12 5\' 1"  (1.549 m)    Weight Status:   Wt Readings from Last 1 Encounters:  09/23/12 116 lb 10 oz (52.9 kg)  Admission weight: 118 lb 9/11  Re-estimated needs:  Kcal: 1534 Protein: 82-100 grams  Fluid: >1.5 L/day  Skin: head and abd incision  Diet Order: NPO   Intake/Output Summary (Last 24 hours) at 09/23/12 1217 Last data filed at 09/23/12 1205  Gross per 24 hour  Intake 3872.5 ml  Output   2700 ml  Net 1172.5 ml    Last BM: via rectal pouch which was inserted 9/14 425 ml 9/19 200 ml and x 3 9/20 150 ml 9/21 700 ml 9/22  Labs:   Recent Labs Lab 09/20/12 0214  09/21/12 0145 09/21/12 0820 09/22/12 0540 09/23/12 0455  NA 152*  < > 147* 146* 142 138  K 3.6  --  3.2*  --  3.5 4.1  CL 117*  --  113*  --  110 105  CO2 27  --  24  --  22 22  BUN 33*  --  32*  --  31* 31*  CREATININE 0.52  --  0.55  --  0.53 0.54  CALCIUM 9.2  --  9.5  --  9.3 9.5  MG 2.3  --  2.1  --   --  2.1  PHOS 4.2  --  4.1  --    --  4.6  GLUCOSE 125*  --  114*  --  125* 110*  < > = values in this interval not displayed.  CBG (last 3)   Recent Labs  09/22/12 0810 09/22/12 1529 09/23/12 0814  GLUCAP 79 107* 104*    Scheduled Meds: . antiseptic oral rinse  15 mL Mouth Rinse QID  . chlorhexidine  15 mL Mouth Rinse BID  . etomidate  40 mg Intravenous Once  . feeding supplement (VITAL AF 1.2 CAL)  1,000 mL Per Tube Q24H  . fentaNYL  200 mcg Intravenous Once  . heparin subcutaneous  5,000 Units Subcutaneous Q8H  . levETIRAcetam  500 mg Intravenous Q12H  . midazolam  4 mg Intravenous Once  . NiMODipine  60 mg Per Tube Q4H   Or  . niMODipine  60 mg Oral Q4H  . pantoprazole sodium  40 mg Per Tube Q1200  . piperacillin-tazobactam (ZOSYN)  IV  3.375 g Intravenous Q8H  . propofol  5-70 mcg/kg/min Intravenous Once  . senna-docusate  1 tablet Oral BID  . simvastatin  80 mg Oral QHS  . vancomycin  500 mg Intravenous Q8H  . vecuronium  10 mg Intravenous Once    Continuous Infusions: . sodium chloride 20 mL/hr at 09/19/12 0700  . sodium chloride 125 mL/hr at 09/23/12 0900  . sodium chloride 20 mL/hr at 09/22/12 [id number], LDN, CNSC [phone] Pager [phone] After Hours Pager

## 2012-09-23 NOTE — Progress Notes (Signed)
Called by RN re: hypotension and decreased LOC this pm. Pt found to have SBP 60s, minimally arousable, not FC, w/d RUE. Levophed started and non-contrast CTH done (unable to do CTA at that time due to lack of adequate periph IV access). CT did not demonstrate new hemorrhage, HCP, or stroke.  IMPRESSION: - SAH d# 12, with sudden onset hypotension and decreased LOC. No new acute hemorrhage or HCP.  - Neurologic causes could include spasm although would be atypical presentation with sudden onset and associated hypo- vs hypertension. SZ also a possibility, already on Keppra.  PLAN: - Currently awaiting serum studies including EKG, BMP, CBC, Trop I, D-dimer. ABG shows good pO2.  - CTA  - Increase SBP to at least 160-156mmHg. Increase IVF and increase levophed

## 2012-09-23 NOTE — Progress Notes (Signed)
Lab results sent earlier reviewed. Repeat CBC confirms significant increase in leukocytosis from 13 to 27 as well as significant anemia, with Hgb drop of ~2.3 points since this am. Given these findings, there is concern for ongoing bleeding, possibly into the abdomen. The change in mental status is more likely related to the hypovolemic shock than a primary neurologic etiology. Will transfuse 2U PRBC and monitor response. When hemodynamically stable, will obtain abdominal imaging and/or consult surgery for further w/u. I discussed the diagnostic possibiliites and the plan above with the on-call critical care physician who is in agreement.

## 2012-09-23 NOTE — Procedures (Cosign Needed)
Bedside Tracheostomy Insertion Procedure Note   Patient Details:   Name: Sally Nelson DOB: Feb 11, 1985 MRN: [id number]  Procedure: Tracheostomy  Pre Procedure Assessment: ET Tube Size:7.0 ET Tube secured at lip (cm):22 Bite block in place: Yes Breath Sounds: Rhonch  Post Procedure Assessment: BP 126/86  Pulse 102  Temp(Src) 98.2 F (36.8 C) (Axillary)  Resp 15  Ht 5\' 1"  (1.549 m)  Wt 116 lb 10 oz (52.9 kg)  BMI 22.05 kg/m2  SpO2 100% O2 sats: stable throughout Complications: No apparent complications Patient did tolerate procedure well Tracheostomy Brand:Shiley Tracheostomy Style:Cuffed Tracheostomy Size: 6.0 Tracheostomy Secured BJY:[id number], velcro Tracheostomy Placement Confirmation:Trach cuff visualized and in place, cxr taken for placement    Melvinia Ashby Ann 09/23/2012, 1:51 PM

## 2012-09-23 NOTE — Progress Notes (Signed)
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 Following commands.  No events overnight.  Spoke with father extensively today, informed him that the patient will likely need trach but will need to speak with NS prior to proceeding further with either extubation or trach/peg to determine prognosis.  Father is ok with whatever recommendations we make. 9/20 - Waxing and waning mental status. More drowsy. Doing SBT but not extubation candidate  SUBJECTIVE/OVERNIGHT/INTERVAL HX:  RN reports no acute events, weaning on PSV but no change in mental status  VITAL SIGNS: Temp:  [98.5 F (36.9 C)-101.9 F (38.8 C)] 100.5 F (38.1 C) (09/23 0800) Pulse Rate:  [95-211] 99 (09/23 0900) Resp:  [15-42] 15 (09/23 0900) BP: (92-131)/(44-85) 112/70 mmHg (09/23 0900) SpO2:  [94 %-100 %] 100 % (09/23 0900) FiO2 (%):  [30 %] 30 % (09/23 0803) Weight:  [52.9 kg (116 lb 10 oz)] 52.9 kg (116 lb 10 oz) (09/23 0456)  VENTILATOR SETTINGS: Vent Mode:  [-] PRVC FiO2 (%):  [30 %] 30 % Set Rate:  [14 bmp] 14 bmp Vt Set:  [500 mL] 500  mL PEEP:  [5 cmH20] 5 cmH20 Pressure Support:  [8 cmH20] 8 cmH20 Plateau Pressure:  [22 cmH20-26 cmH20] 22 cmH20  INTAKE / OUTPUT: Intake/Output     09/22 0701 - 09/23 0700 09/23 0701 - 09/24 0700   I.V. (mL/kg) 2300 (43.5) 200 (3.8)   Other 250    NG/GT 880 100   IV Piggyback 555    Total Intake(mL/kg) 3985 (75.3) 300 (5.7)   Urine (mL/kg/hr) 2260 (1.8) 190 (1.3)   Drains 138 (0.1) 19 (0.1)   Stool 700 (0.6)    Total Output 3098 209   Net +887 +91         PHYSICAL EXAMINATION: Gen: young female, NAD on vent  HEENT: scalp dressing in place, drain in place, ETT in place PULM: resps even non labored on PSV, coarse, good air entry CV: RRR, loud diastolic/systolic murmur, no JVD AB: BS+, soft, nontender, no hsm Ext: warm, no edema, no clubbing, no cyanosis Derm: no rash or skin breakdown Neuro: eyes open, no response to verbal command, occ follow commands  LABS: PULMONARY  Recent Labs Lab 09/17/12 0357 09/18/12 0508 09/23/12 0354  PHART 7.414 7.418 7.460*  PCO2ART 41.3 38.1 31.8*  PO2ART 147.0* 121.0* 32.0*  HCO3 25.9* 24.2* 22.4  TCO2 27.2 25.4 23  O2SAT 99.0 98.7 63.0   CBC  Recent Labs Lab 09/21/12 0145 09/22/12 0540 09/23/12 0455  HGB 7.0* 7.3* 8.3*  HCT 21.4* 22.1* 25.0*  WBC 11.3* 13.0* 13.0*  PLT 154 163 144*   CHEMISTRY  Recent Labs Lab 09/18/12 0300  09/19/12 0200  09/20/12 0214  09/20/12 2000 09/21/12 0145 09/21/12 0820 09/22/12 0540 09/23/12 0455  NA 154*  < > 156*  < > 152*  < > 151* 147* 146* 142 138  K 3.5  --  4.2  --  3.6  --   --  3.2*  --  3.5 4.1  CL 118*  --  122*  --  117*  --   --  113*  --  110 105  CO2 28  --  26  --  27  --   --  24  --  22 22  GLUCOSE 156*  --  131*  --  125*  --   --  114*  --  125* 110*  BUN 24*  --  33*  --  33*  --   --  32*  --  31* 31*  CREATININE 0.49*  --  0.62  --  0.52  --   --  0.55  --  0.53 0.54  CALCIUM 9.5  --  9.5  --  9.2  --   --  9.5  --  9.3 9.5  MG 2.4  --  2.5  --  2.3  --   --   2.1  --   --  2.1  PHOS 4.6  --  4.9*  --  4.2  --   --  4.1  --   --  4.6  < > = values in this interval not displayed. Estimated Creatinine Clearance: 81.8 ml/min (by C-G formula based on Cr of 0.54).  ENDOCRINE CBG (last 3)   Recent Labs  09/22/12 0810 09/22/12 1529 09/23/12 0814  GLUCAP 79 107* 104*    IMAGING x48h  Dg Chest Port 1 View  09/23/2012   *RADIOLOGY REPORT*  Clinical Data: ET tube placement.  PORTABLE CHEST - 1 VIEW  Comparison: 09/22/2012.  Findings: Unchanged endotracheal tube and central venous catheter. Nasogastric tube remains in the stomach.  No pneumothorax.  Early right base opacity is more apparent on today's film due to lack of rotation.  No effusion.  Left lung clear.  Negative osseous structures.  IMPRESSION: Suspect early right lower lobe infiltrate.  Tubes and lines satisfactory position.   Original Report Authenticated By: Davonna Belling, M.D.   Dg Chest Port 1 View  09/22/2012   CLINICAL DATA:  Intubated patient.  EXAM: PORTABLE CHEST - 1 VIEW  COMPARISON:  Single view of the chest 09/21/2012 and 09/2012.  FINDINGS: The patient is rotated on this study. Support tubes and lines are unchanged. Lungs appear clear. Heart size is normal. No pneumothorax or pleural fluid.  IMPRESSION: No acute disease.   Electronically Signed   By: Drusilla Kanner M.D.   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  Will likely need a VP shunt, defer to neurosurgery. - Cont keppra.  PULMONARY A:  Post op respiratory failure > good vent mechanics/oxygenation CAP vs aspiration pneumonia (favor aspiration) - improving CXR Asthma - without exacerbation P:   - Hold weaning, trach today. - Titrate O2 for sats.  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 HTN - Echo  with grade 2 diastolic dysfunction, thickening and EF  of 60-65%. P:  - Tele. - BP control as ordered.  RENAL A:   Hyponatremia.   Hypokalemia. P:   - F/u chem  - Replace electrolytes as needed.  GASTROINTESTINAL A:   Dysphagia - in setting of AMS / Resp fx P:   - Hold TF for PEG today at 2 PM. - Pantoprazole for stress ulcer prophylaxis.  HEMATOLOGIC A:   Anemia P:  - Monitor h/h - Transfusion threshold < 7gm/dl  INFECTIOUS A:   Aspiration pneumonia.  Febrile on 9/22. P:   - Unasyn course complete. - Recultured 9/22 and pending. - F/U CSF culture.   - Vanc/zosyn per pharmacy.  ENDOCRINE A:   Single episode of hypoglycemia. P:   - Monitor glucose.  The patient is critically ill with multiple organ systems failure and requires high complexity decision making for assessment and support, frequent evaluation and titration of therapies, application of advanced monitoring technologies and extensive interpretation of multiple databases.   CC time 35 minutes.  Alyson Reedy, M.D. [facility] Pulmonary/Critical Care Medicine. Pager: [phone]. After hours pager: [phone].

## 2012-09-24 ENCOUNTER — Encounter (HOSPITAL_COMMUNITY): Payer: Self-pay | Admitting: Radiology

## 2012-09-24 ENCOUNTER — Inpatient Hospital Stay (HOSPITAL_COMMUNITY): Payer: Medicaid Other

## 2012-09-24 LAB — BLOOD GAS, ARTERIAL
Acid-base deficit: 5.5 mmol/L — ABNORMAL HIGH (ref 0.0–2.0)
Drawn by: 32470
FIO2: 0.3 %
PEEP: 5 cmH2O
Patient temperature: 98.6
Pressure control: 12 cmH2O
RATE: 20 resp/min
TCO2: 19 mmol/L (ref 0–100)
pCO2 arterial: 28.6 mmHg — ABNORMAL LOW (ref 35.0–45.0)
pH, Arterial: 7.419 (ref 7.350–7.450)
pO2, Arterial: 132 mmHg — ABNORMAL HIGH (ref 80.0–100.0)

## 2012-09-24 LAB — CBC
HCT: 28.1 % — ABNORMAL LOW (ref 36.0–46.0)
HCT: 30.5 % — ABNORMAL LOW (ref 36.0–46.0)
HCT: 25.5 % — ABNORMAL LOW (ref 36.0–46.0)
Hemoglobin: 9.9 g/dL — ABNORMAL LOW (ref 12.0–15.0)
MCH: 28.1 pg (ref 26.0–34.0)
MCH: 28.2 pg (ref 26.0–34.0)
MCHC: 35.2 g/dL (ref 30.0–36.0)
MCHC: 35.7 g/dL (ref 30.0–36.0)
MCHC: 35.7 g/dL (ref 30.0–36.0)
MCV: 78.8 fL (ref 78.0–100.0)
MCV: 78.5 fL (ref 78.0–100.0)
Platelets: 135 10*3/uL — ABNORMAL LOW (ref 150–400)
RDW: 19.1 % — ABNORMAL HIGH (ref 11.5–15.5)
RDW: 17.6 % — ABNORMAL HIGH (ref 11.5–15.5)
RDW: 18.1 % — ABNORMAL HIGH (ref 11.5–15.5)
WBC: 22.1 10*3/uL — ABNORMAL HIGH (ref 4.0–10.5)

## 2012-09-24 LAB — URINE CULTURE

## 2012-09-24 LAB — PREPARE RBC (CROSSMATCH)

## 2012-09-24 LAB — PHOSPHORUS: Phosphorus: 4.1 mg/dL (ref 2.3–4.6)

## 2012-09-24 LAB — BASIC METABOLIC PANEL
BUN: 31 mg/dL — ABNORMAL HIGH (ref 6–23)
CO2: 19 mEq/L (ref 19–32)
Calcium: 8.7 mg/dL (ref 8.4–10.5)
Chloride: 105 mEq/L (ref 96–112)
Creatinine, Ser: 0.61 mg/dL (ref 0.50–1.10)
GFR calc Af Amer: >90 mL/min (ref >90)
GFR calc non Af Amer: >90 mL/min (ref >90)
Glucose, Bld: 202 mg/dL — ABNORMAL HIGH (ref 70–99)
Potassium: 3.8 mEq/L (ref 3.5–5.1)
Sodium: 136 mEq/L (ref 135–145)

## 2012-09-24 LAB — PATHOLOGIST SMEAR REVIEW

## 2012-09-24 LAB — GLUCOSE, CAPILLARY: Glucose-Capillary: 155 mg/dL — ABNORMAL HIGH (ref 70–99)

## 2012-09-24 MED ORDER — IOHEXOL 350 MG/ML SOLN
50.0000 mL | Freq: Once | INTRAVENOUS | Status: AC | PRN
  Administered 2012-09-24: 50 mL via INTRAVENOUS

## 2012-09-24 NOTE — Progress Notes (Signed)
Transcranial Doppler  Date POD PCO2 HCT BP  MCA ACA PCA OPHT SIPH VERT Basilar  09/15/12 MS    131/78 Right  Left   59  69   -68  -74   33  20   14  14    -24  46   -26  *     *    09/17/12 MS     Right  Left   42  35   -22  -25   25  29   21  19   26   -40   -21  -25     -19    09/19/12 MS     Right  Left   132  35   -33  -40   12  -25    16  26   22   44   -22  -24     -24    09/22/12 MS      Right  Left   150  126   -92  -31   13  41   23  20   *  51   -22  -30   -24      09-24-12 SB      Right  Left   134  118   -58  -83   45  -33   26  37   24  34   -31  -25     -52         Right  Left                                            Right  Left                                        MCA = Middle Cerebral Artery      OPHT = Opthalmic Artery     BASILAR = Basilar Artery   ACA = Anterior Cerebral Artery     SIPH = Carotid Siphon PCA = Posterior Cerebral Artery   VERT = Verterbral Artery                   Normal MCA = 62+\-12 ACA = 50+\-12 PCA = 42+\-23   * Unable to insonate.   09/24/2012 3:05 PM Gertie Fey, RVT, RDCS, RDMS

## 2012-09-24 NOTE — Progress Notes (Signed)
0145 called Elink to notify of a discrepancy with axillary temperature during blood transfusion. At 2250 axillary temp read 101.3 and at 2330 axillary temp read 102.7. Nurse suggested trying a rectal temperature. Rectal temperature read 102.4. Dr. Darrick Penna aware that pt had fever throughout the day and ordered to continue with blood transfusion. Tylenol was also given. Will continue to monitor.

## 2012-09-24 NOTE — Progress Notes (Signed)
Trach Team Note  Pt with new tracheostomy with vent support. SLP will follow via chart for needs. Harlon Ditty, MA CCC-SLP [phone]

## 2012-09-24 NOTE — Progress Notes (Signed)
Patient bled from G-tube placement.  Seems to have stopped now.  BP is fine.  Okay to start tube feedings.  Will continue to follow.  Marta Lamas. Gae Bon, MD, FACS [phone] [facility]

## 2012-09-24 NOTE — Progress Notes (Signed)
Pt seen and examined. Events of last pm reviewed. Has received 3U PRBC with good response to transfusion. CTA and CT abd/pelvis completed. Improvement in neurologic exam overnight with increased BP. Initially was non-responsive with BP <119mmHg.  EXAM: Temp:  [98.2 F (36.8 C)-102.7 F (39.3 C)] 99.3 F (37.4 C) (09/24 0730) Pulse Rate:  [89-138] 91 (09/24 0747) Resp:  [14-20] 18 (09/24 0747) BP: (64-157)/(34-103) 157/86 mmHg (09/24 0747) SpO2:  [94 %-100 %] 100 % (09/24 0747) FiO2 (%):  [30 %] 30 % (09/24 0747) Weight:  [53.9 kg (118 lb 13.3 oz)] 53.9 kg (118 lb 13.3 oz) (09/24 0500) Intake/Output     09/23 0701 - 09/24 0700 09/24 0701 - 09/25 0700   I.V. (mL/kg) 4130.7 (76.6)    Blood 712.5 264.5   Other     NG/GT 315    IV Piggyback 630    Total Intake(mL/kg) 5788.2 (107.4) 264.5 (4.9)   Urine (mL/kg/hr) 1615 (1.2)    Drains 184.5 (0.1)    Stool 175 (0.1)    Total Output 1974.5     Net +3813.7 +264.5          Somnolent but arousable to noxious stimulit Intubated, breathing over vent Follows commands after stimulation in RUE/RLE. No movement LUE/LLE Wound c/d/i Abd soft, non-distended  LABS: Lab Results  Component Value Date   CREATININE 0.61 09/24/2012   BUN 31* 09/24/2012   NA 136 09/24/2012   K 3.8 09/24/2012   CL 105 09/24/2012   CO2 19 09/24/2012   Lab Results  Component Value Date   WBC 27.7* 09/24/2012   HGB 9.9* 09/24/2012   HCT 28.1* 09/24/2012   MCV 79.8 09/24/2012   PLT 165 09/24/2012    IMAGING: CTA reviewed demonstrating mod-severe distal LICA and MCA/ACA vasospasm. Minimal posterior circ spasm seen. CT abd/pelvis demonstrates small amount of blood in abd  IMPRESSION: - 27 y.o. female SAH d# 13 s/p RMCA clipping and craniectomy - Clinical vasospasm responsive to hypertension/hyervolemia - hypovolemia/anemia related to bleeding after PEG, responsive to transfusion  PLAN: - Maintain IVF hydration at 200cc/hr, Pressors for goal SBP > and MAP >  120 if possible - EVD open at 10 - Cont nimotop/zocor, TCD today - Leukocytosis ? Related to recent surgical procedures. Cont empiric abx (Zosyn/Vanc) - Start TF today - Vent mgmt per CCM

## 2012-09-24 NOTE — Progress Notes (Signed)
Pts allergy to contrast is noted, however pt requires CTA ungently to identify any possible vasospasm, and cannot wait to complete the standard 13hr pre-medication protocol. I fel it medically necessary to complete the CTA brain now, with prednisone and benadryl already given.

## 2012-09-24 NOTE — Progress Notes (Signed)
CRITICAL VALUE ALERT  Critical value received: Hemoglobin 6.1 and Hematocrit of 17.8  Date of notification:  09/23/12  Time of notification:  2025  Critical value read back:yes  Nurse who received alert:  Tedra Coupe, RN  MD notified (1st page):  Sharion Dove  Time of first page:  2035  MD notified (2nd page):  Time of second page:  Responding MD:  Sharion Dove  Time MD responded:  [phone]

## 2012-09-24 NOTE — Progress Notes (Signed)
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 Following commands.  No events overnight.  Spoke with father extensively today, informed him that the patient will likely need trach but will need to speak with NS prior to proceeding further with either extubation or trach/peg to determine prognosis.  Father is ok with whatever recommendations we make. 9/20 - Waxing and waning mental status. More drowsy. Doing SBT but not extubation candidate 9/23- PEG Tube, Tracheostomy 9/23- Decreased LOC, hypotensive, increased leukocytosis, decreased Hgb. No new acute Hem or HCP on CT. Pt was given 3U PRBC and Levophed.   SUBJECTIVE/OVERNIGHT/INTERVAL HX:   9/23 PM, episode of hypotension (SBP 60s), decreased LOC, leukocytosis, decreased HCT, febrile. 3Units PRBC were infused. CT scan was negative for new acute hem or HCP.   Good response to transfusion.  This AM, father at bedside. Pt unresponsive, on vent FiO2 30%, O2 sat 100%. Nurse states level of alertness waxes and wanes. Was  responsive to father and nurse in early AM, 9/24.  VITAL SIGNS: Temp:  [98.2 F (36.8 C)-102.7 F (39.3 C)] 99.3 F (37.4 C) (09/24 0730) Pulse Rate:  [89-138] 113 (09/24 0900) Resp:  [14-23] 23 (09/24 0900) BP: (64-157)/(34-103) 138/77 mmHg (09/24 0900) SpO2:  [94 %-100 %] 100 % (09/24 0900) FiO2 (%):  [30 %] 30 % (09/24 0747) Weight:  [53.9 kg (118 lb 13.3 oz)] 53.9 kg (118 lb 13.3 oz) (09/24 0500)  VENTILATOR SETTINGS: Vent Mode:  [-] PRVC FiO2 (%):  [30 %] 30 % Set Rate:  [14 bmp-18 bmp] 18 bmp Vt Set:  [500 mL] 500 mL PEEP:  [5 cmH20] 5 cmH20 Plateau Pressure:  [21 cmH20-25 cmH20] 24 cmH20  INTAKE / OUTPUT: Intake/Output     09/23 0701 - 09/24 0700 09/24 0701 - 09/25 0700   I.V. (mL/kg) 4130.7 (76.6)    Blood 712.5 264.5   Other     NG/GT 315    IV Piggyback 630    Total Intake(mL/kg) 5788.2 (107.4) 264.5 (4.9)   Urine (mL/kg/hr) 1615 (1.2)    Drains 184.5 (0.1)    Stool 175 (0.1)    Total Output 1974.5     Net +3813.7 +264.5         PHYSICAL EXAMINATION: Gen: young female, NAD on vent  HEENT: scalp dressing in place, drain in place, ETT in place PULM: resps even non labored on PSV, trach in place, FiO2 30%, PEEP 5, O2 sat 100% CV: s1 s2 RRR, loud diastolic/systolic murmur, no JVD AB: BS+, soft, nontender, no hsm, PEG tube in place Ext: warm, no edema, no clubbing,  no cyanosis Derm: no rash or skin breakdown Neuro: eyes open, no response to verbal command, occ follow commands  LABS: PULMONARY  Recent Labs Lab 09/18/12 0508 09/23/12 0354 09/23/12 1934  PHART 7.418 7.460* 7.509*  PCO2ART 38.1 31.8* 27.3*  PO2ART 121.0* 32.0* 134.0*  HCO3 24.2* 22.4 21.4  TCO2 25.4 23 22.2  O2SAT 98.7 63.0 99.1   CBC  Recent Labs Lab 09/23/12 2130 09/24/12 0407 09/24/12 0845  HGB 6.0* 9.9* 10.9*  HCT 17.8* 28.1* 30.5*  WBC 28.7* 27.7* 25.7*  PLT 195 165 178   CHEMISTRY  Recent Labs Lab 09/19/12 0200  09/20/12 0214  09/21/12 0145 09/21/12 0820  09/22/12 0540 09/23/12 0455 09/23/12 2025 09/24/12 0407  NA 156*  < > 152*  < > 147* 146* 142 138 140 136  K 4.2  --  3.6  --  3.2*  --  3.5 4.1 3.8 3.8  CL 122*  --  117*  --  113*  --  110 105 106 105  CO2 26  --  27  --  24  --  22 22 21 19   GLUCOSE 131*  --  125*  --  114*  --  125* 110* 122* 202*  BUN 33*  --  33*  --  32*  --  31* 31* 32* 31*  CREATININE 0.62  --  0.52  --  0.55  --  0.53 0.54 0.56 0.61  CALCIUM 9.5  --  9.2  --  9.5  --  9.3 9.5 8.2* 8.7  MG 2.5  --  2.3  --  2.1  --   --  2.1  --  2.1  PHOS 4.9*  --  4.2  --  4.1  --   --  4.6  --  4.1  < > = values in this interval not displayed. Estimated Creatinine Clearance: 81.8 ml/min (by C-G formula based on Cr of 0.61).  ENDOCRINE CBG (last 3)   Recent Labs  09/23/12 1551 09/23/12 2357 09/24/12 0759  GLUCAP 96 119* 185*    IMAGING x48h  Ct Abdomen Pelvis Wo Contrast  09/24/2012   CLINICAL DATA:  Decreased hemoglobin. Question intra-abdominal bleeding. Status post gastrostomy tube placement 09/23/2012.  EXAM: CT ABDOMEN AND PELVIS WITHOUT CONTRAST  TECHNIQUE: Multidetector CT imaging of the abdomen and pelvis was performed following the standard protocol without intravenous contrast.  COMPARISON:  None.  FINDINGS: Right basilar airspace disease with air bronchograms present is identified. Small right pleural effusion is present. There is no left pleural effusion or pericardial effusion.  Scattered abdominal ascites is identified. There is a hematocrit level in the pelvis consistent with intra-abdominal bleeding. The largest collection of hemorrhage is in the left upper quadrant of the abdomen anterior to the spleen along the greater curvature of the stomach. The liver, spleen, adrenal glands, pancreas and kidneys appear normal. Gastrostomy tube is in place in the stomach. A Foley catheter is in place in a decompressed urinary bladder and a rectal tube is identified. The colon is fluid filled. There is no evidence of  bowel obstruction. No free intraperitoneal air is identified. There is no lymphadenopathy. Small exostosis off the right iliac wing is noted. Bone flaps from craniotomy from craniotomy in the anterior abdominal wall are noted. No worrisome bony abnormality is identified.  IMPRESSION: Scattered hemorrhage within the abdomen appearing most confluent in the left upper quadrant anterior to the spleen and along the greater curvature of the stomach is likely related to recent gastrostomy  tube placement.  Right basilar airspace disease is worrisome for pneumonia.   Electronically Signed   By: Drusilla Kanner M.D.   On: 09/24/2012 03:47   Ct Angio Head W/cm &/or Wo Cm  09/24/2012   CLINICAL DATA:  Evaluate for vasospasm.  EXAM: CT ANGIOGRAPHY HEAD  TECHNIQUE: Multidetector CT imaging of the head was performed using the standard protocol during bolus administration of intravenous contrast. Multiplanar CT image reconstructions including MIPs were obtained to evaluate the vascular anatomy.  CONTRAST:  50mL OMNIPAQUE IOHEXOL 350 MG/ML SOLN  COMPARISON:  None.  FINDINGS: Right Carotid system: High-grade distal right ICA (paraclinoid and supra clinoid) narrowing, greater than 50%, consistent with vasospasm. There is a short segment of normalization followed by carotid terminus moderate stenosis. The right A1 is diffusely and severly stenotic compared to the preoperative study. Right M1, beyond the bifurcation, high-grade narrowing diffusely. The distal vasculature is vasodilated in the setting of infarct.  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 The vessel is occluded distally.  Left carotid system: The left carotid terminus is up to 50% stenotic diffusldy. The left A1 appears relatively stable compared to the prior. The left M1 and M2 branches appear diffusely smaller than on preoperative study.  Posterior circulation: Extremely tortuous vertebrobasilar  system with codominant vertebral arteries. There is a fetal type PCA on the left. No focal posterior circulation vasospasm identified.  Subacute right MCA territory infarction with extensive cortical enhancement and subcortical edema. Increased intracranial pressure with similar herniation through the craniectomy and sulcal effacement at the vertex. Similar volume of intraventricular hemorrhage, present predominantly within the 3rd and right lateral ventricle. There is some layering blood within the occipital horn left lateral ventricle. Ventricular enlargement is similar to prior. A left frontal EVD with surrounding mild hemorrhage and edema is stably positioned. Extra-axial hemorrhage in the right frontal region is unchanged at 9 mm, compressing the right frontal lobe. No evidence of acute ischemia.  Review of the MIP images confirms the above findings.  CriticalValue/emergent results were called by telephone at the time of interpretation on 09/24/2012 at 3:55 AMto Dr Venetia Maxon, who verbally acknowledged these results.  IMPRESSION: 1.  Multi focal vasospasm:  *Tandem high-grade distal right ICA stenoses. *Diffuse high-grade stenosis of the dominant right  A1 segment. *Extensive right M1 vasospasm (with downstream complete MCA territory infarction). *Diffuse mild/moderate narrowing of the distal left ICA, left A1 segment, and left M1 segment.  2. Angiographic consideration: Extremely tortuous internal carotid arteries with focal luminal irregularity at the posterior genu right cavernous ICA.  3. 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 No evidence of re-hemorrhage.  4. Stable appearance of ventriculomegaly, intraventricular and extra-axial hemorrhage, and increased intracranial pressure.   Electronically Signed   By: Tiburcio Pea   On: 09/24/2012 04:06   Ct Head Wo Contrast  09/23/2012   CLINICAL DATA:  Neurologic status changes.  Intracranial hemorrhage.  EXAM: CT HEAD WITHOUT CONTRAST   TECHNIQUE: Contiguous axial images were obtained from the base of the skull through the vertex without intravenous contrast.  COMPARISON:  09/17/2012  FINDINGS: Left frontal ventriculostomy catheter noted with tip in the 3rd ventricle.  Compared to the prior exam, there is reduced hemorrhage in the left lateral ventricle and a reduced volume of hemorrhage in the 3rd ventricle. Evolutionary changes in the right lateral ventricular hemorrhage noted, with overall similar volume but more of the hemorrhage layering in the  occipital horn.  Right frontotemporal craniectomy observed with subjective increase in bulging of intracranial contents along the craniectomy. There continues to be subdural hematoma along the right frontal bone and falx along with evolutionary findings in the subarachnoid and parenchymal hemorrhage along the right frontal and temporal lobes. The sulci remain effaced although the degree of white matter hypodensity in the right frontotemporal region is reduced. Currently no overt hydrocephalus scar at that there is stable minimal dilatation of the temporal horns of lateral ventricles. There is mild effacement of the right side of the suprasellar cistern without overt uncal herniation.  IMPRESSION: 1. Evolutionary changes include reduced severity of the edema in the right cerebral hemisphere and reduced hemorrhage in the left lateral ventricle and 3rd ventricle. However, there may be slightly more bulging along the craniectomy site.   Electronically Signed   By: Herbie Baltimore   On: 09/23/2012 17:33   Dg Chest Port 1 View  09/24/2012   CLINICAL DATA:  History of intracranial hemorrhage.  EXAM: PORTABLE CHEST - 1 VIEW  COMPARISON:  Single view of the chest 09/23/2012.  FINDINGS: Tracheostomy and left subclavian catheter are again seen. Right basilar airspace disease has worsened. The left lung appears clear. No pneumothorax is identified.  IMPRESSION: Increased right basilar airspace disease which  could represent atelectasis or pneumonia.   Electronically Signed   By: Drusilla Kanner M.D.   On: 09/24/2012 06:22   Chest Portable 1 View To Assess Tube Placement And Rule-out Pneumothorax  09/23/2012   CLINICAL DATA:  Tube placement and rule out pneumothorax.  EXAM: PORTABLE CHEST - 1 VIEW  COMPARISON:  09/23/2012  FINDINGS: Patient has a left subclavian central line, tip overlying the level of the superior vena cava. Tracheostomy tube has been placed, tip approximately 6 cm above the carinal.  Heart size is normal. There has been development of increased right basilar atelectasis. There is associated mediastinal shift toward the right. No evidence for pneumothorax.  IMPRESSION: 1. Interval placement tracheostomy tube. 2. Interval increased right basilar atelectasis with significant volume loss.   Electronically Signed   By: Rosalie Gums M.D.   On: 09/23/2012 14:35   Dg Chest Port 1 View  09/23/2012   *RADIOLOGY REPORT*  Clinical Data: ET tube placement.  PORTABLE CHEST - 1 VIEW  Comparison: 09/22/2012.  Findings: Unchanged endotracheal tube and central venous catheter. Nasogastric tube remains in the stomach.  No pneumothorax.  Early right base opacity is more apparent on today's film due to lack of rotation.  No effusion.  Left lung clear.  Negative osseous structures.  IMPRESSION: Suspect early right lower lobe infiltrate.  Tubes and lines satisfactory position.   Original Report Authenticated By: Davonna Belling, M.D.     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 HTN - Echo with grade 2 diastolic dysfunction, thickening and EF of 60-65%. 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 - BP control as ordered. consider MAP goals 85-105 tcd cvp  RENAL A:   Hyponatremia, mild in setting brain edema P:   - F/u chem in am  - Avoid free water, brain edema -NO lasix with HHH -cvp  GASTROINTESTINAL A:   Dysphagia - in setting of AMS / Resp fx No active bleeding noted P:   - Tf to restart - Pantoprazole  HEMATOLOGIC A:   Anemia, s/p 3U PRBC 9/24 > good response P:  - Monitor h/h - Transfusion threshold < 7gm/dl -scd  INFECTIOUS A:   Aspiration pneumonia.  9/24 Febrile, chest Xray shows worsening of RLL opacification  UTI P:   - Recultured 9/22 and pending. - F/U CSF culture.   - Vanc/zosyn per pharmacy -low threshold if fevers continued to change zosyn to ceftaz vs meropenem -pcxr in am to re eval infiltrate rt  Ensure sputum sent Follow gram neg rod -change foley  ENDOCRINE A:   Single episode of hyperglycemia, may be transient due to steroids  P:   - Monitor glucose.  The patient is critically ill with multiple organ systems failure and requires high complexity decision making for assessment and support, frequent evaluation and titration of therapies, application of advanced monitoring technologies and extensive interpretation of multiple databases.   CC time 35 minutes.  Mcarthur Rossetti. Tyson Alias, MD, FACP Pgr: [phone] [facility]

## 2012-09-25 ENCOUNTER — Inpatient Hospital Stay (HOSPITAL_COMMUNITY): Payer: Medicaid Other

## 2012-09-25 DIAGNOSIS — Z7401 Bed confinement status: Secondary | ICD-10-CM

## 2012-09-25 DIAGNOSIS — Z09 Encounter for follow-up examination after completed treatment for conditions other than malignant neoplasm: Secondary | ICD-10-CM

## 2012-09-25 DIAGNOSIS — Z930 Tracheostomy status: Secondary | ICD-10-CM

## 2012-09-25 LAB — COMPREHENSIVE METABOLIC PANEL
ALT: 29 U/L (ref 0–35)
AST: 36 U/L (ref 0–37)
Alkaline Phosphatase: 60 U/L (ref 39–117)
CO2: 18 mEq/L — ABNORMAL LOW (ref 19–32)
Calcium: 8.2 mg/dL — ABNORMAL LOW (ref 8.4–10.5)
GFR calc non Af Amer: >90 mL/min (ref >90)
Potassium: 3.2 mEq/L — ABNORMAL LOW (ref 3.5–5.1)
Sodium: 136 mEq/L (ref 135–145)
Total Bilirubin: 0.6 mg/dL (ref 0.3–1.2)
Total Protein: 7.3 g/dL (ref 6.0–8.3)

## 2012-09-25 LAB — CBC WITH DIFFERENTIAL/PLATELET
Basophils Absolute: 0 10*3/uL (ref 0.0–0.1)
Basophils Relative: 0 % (ref 0–1)
Eosinophils Absolute: 0 10*3/uL (ref 0.0–0.7)
HCT: 24 % — ABNORMAL LOW (ref 36.0–46.0)
Hemoglobin: 8.6 g/dL — ABNORMAL LOW (ref 12.0–15.0)
Lymphs Abs: 3.3 10*3/uL (ref 0.7–4.0)
MCH: 28.5 pg (ref 26.0–34.0)
MCHC: 35.8 g/dL (ref 30.0–36.0)
MCV: 79.5 fL (ref 78.0–100.0)
Monocytes Absolute: 0.8 10*3/uL (ref 0.1–1.0)
Monocytes Relative: 4 % (ref 3–12)
Neutro Abs: 16.4 10*3/uL — ABNORMAL HIGH (ref 1.7–7.7)
Platelets: 133 10*3/uL — ABNORMAL LOW (ref 150–400)
RDW: 18.7 % — ABNORMAL HIGH (ref 11.5–15.5)

## 2012-09-25 LAB — GLUCOSE, CAPILLARY
Glucose-Capillary: 122 mg/dL — ABNORMAL HIGH (ref 70–99)
Glucose-Capillary: 174 mg/dL — ABNORMAL HIGH (ref 70–99)
Glucose-Capillary: 118 mg/dL — ABNORMAL HIGH (ref 70–99)
Glucose-Capillary: 156 mg/dL — ABNORMAL HIGH (ref 70–99)

## 2012-09-25 LAB — VANCOMYCIN, TROUGH: Vancomycin Tr: 8.6 ug/mL — ABNORMAL LOW (ref 10.0–20.0)

## 2012-09-25 MED ORDER — POTASSIUM CHLORIDE 20 MEQ/15ML (10%) PO LIQD
30.0000 meq | ORAL | Status: AC
  Administered 2012-09-25 (×2): 30 meq
  Filled 2012-09-25 (×2): qty 30

## 2012-09-25 MED ORDER — VANCOMYCIN HCL IN DEXTROSE 1-5 GM/200ML-% IV SOLN
1000.0000 mg | Freq: Three times a day (TID) | INTRAVENOUS | Status: DC
  Administered 2012-09-25 – 2012-09-26 (×4): 1000 mg via INTRAVENOUS
  Filled 2012-09-25 (×5): qty 200

## 2012-09-25 MED ORDER — POTASSIUM CHLORIDE 10 MEQ/50ML IV SOLN: 10.0000 meq | INTRAVENOUS | Status: DC

## 2012-09-25 MED ORDER — POTASSIUM CHLORIDE 20 MEQ/15ML (10%) PO LIQD
ORAL | Status: AC
  Filled 2012-09-25: qty 30

## 2012-09-25 MED ORDER — INSULIN ASPART 100 UNIT/ML ~~LOC~~ SOLN
2.0000 [IU] | SUBCUTANEOUS | Status: DC
  Administered 2012-09-25 – 2012-09-26 (×3): 4 [IU] via SUBCUTANEOUS
  Administered 2012-09-26 (×2): 2 [IU] via SUBCUTANEOUS
  Administered 2012-09-26: 4 [IU] via SUBCUTANEOUS
  Administered 2012-09-26: 2 [IU] via SUBCUTANEOUS
  Administered 2012-09-26: 4 [IU] via SUBCUTANEOUS
  Administered 2012-09-27 (×4): 2 [IU] via SUBCUTANEOUS
  Administered 2012-09-27 (×2): 4 [IU] via SUBCUTANEOUS
  Administered 2012-09-28 – 2012-10-03 (×19): 2 [IU] via SUBCUTANEOUS

## 2012-09-25 MED ORDER — JEVITY 1.2 CAL PO LIQD
1000.0000 mL | ORAL | Status: DC
  Administered 2012-09-26 – 2012-09-30 (×5): 1000 mL
  Administered 2012-10-01 – 2012-10-02 (×2)
  Filled 2012-09-25 (×14): qty 1000

## 2012-09-25 MED ORDER — PRO-STAT SUGAR FREE PO LIQD
30.0000 mL | Freq: Every day | ORAL | Status: DC
  Administered 2012-09-25 – 2012-10-08 (×14): 30 mL
  Filled 2012-09-25 (×16): qty 30

## 2012-09-25 NOTE — Progress Notes (Signed)
13 Days Post-Op  Subjective: Awake on vent  Objective: Vital signs in last 24 hours: Temp:  [97 F (36.1 C)-102.1 F (38.9 C)] 102.1 F (38.9 C) (09/25 0400) Pulse Rate:  [92-118] 103 (09/25 0730) Resp:  [18-48] 24 (09/25 0730) BP: (111-158)/(45-96) 129/54 mmHg (09/25 0730) SpO2:  [98 %-100 %] 100 % (09/25 0730) FiO2 (%):  [30 %] 30 % (09/25 0400) Weight:  [57.3 kg (126 lb 5.2 oz)] 57.3 kg (126 lb 5.2 oz) (09/25 0342) Last BM Date: 09/24/12  Intake/Output from previous day: 09/24 0701 - 09/25 0700 In: 6965.8 [I.V.:5125.6; Blood:264.5; NG/GT:845.7; IV Piggyback:650] Out: 2485 [Urine:1680; Drains:255; Stool:550] Intake/Output this shift: Total I/O In: 70 [NG/GT:70] Out: 124 [Urine:120; Drains:4]  General appearance: cooperative Neck: trach Resp: clear to auscultation bilaterally Cardio: regular rate and rhythm GI: PEG in place, NT, binder on   Lab Results:   Recent Labs  09/24/12 1700 09/25/12 0400  WBC 22.1* 20.5*  HGB 9.1* 8.6*  HCT 25.5* 24.0*  PLT 135* 133*   BMET  Recent Labs  09/24/12 0407 09/25/12 0400  NA 136 136  K 3.8 3.2*  CL 105 107  CO2 19 18*  GLUCOSE 202* 165*  BUN 31* 25*  CREATININE 0.61 0.48*  CALCIUM 8.7 8.2*   PT/INR No results found for this basename: LABPROT, INR,  in the last 72 hours ABG  Recent Labs  09/23/12 1934 09/24/12 1420  PHART 7.509* 7.419  HCO3 21.4 18.2*    Studies/Results: Ct Abdomen Pelvis Wo Contrast  09/24/2012   CLINICAL DATA:  Decreased hemoglobin. Question intra-abdominal bleeding. Status post gastrostomy tube placement 09/23/2012.  EXAM: CT ABDOMEN AND PELVIS WITHOUT CONTRAST  TECHNIQUE: Multidetector CT imaging of the abdomen and pelvis was performed following the standard protocol without intravenous contrast.  COMPARISON:  None.  FINDINGS: Right basilar airspace disease with air bronchograms present is identified. Small right pleural effusion is present. There is no left pleural effusion or  pericardial effusion.  Scattered abdominal ascites is identified. There is a hematocrit level in the pelvis consistent with intra-abdominal bleeding. The largest collection of hemorrhage is in the left upper quadrant of the abdomen anterior to the spleen along the greater curvature of the stomach. The liver, spleen, adrenal glands, pancreas and kidneys appear normal. Gastrostomy tube is in place in the stomach. A Foley catheter is in place in a decompressed urinary bladder and a rectal tube is identified. The colon is fluid filled. There is no evidence of bowel obstruction. No free intraperitoneal air is identified. There is no lymphadenopathy. Small exostosis off the right iliac wing is noted. Bone flaps from craniotomy from craniotomy in the anterior abdominal wall are noted. No worrisome bony abnormality is identified.  IMPRESSION: Scattered hemorrhage within the abdomen appearing most confluent in the left upper quadrant anterior to the spleen and along the greater curvature of the stomach is likely related to recent gastrostomy tube placement.  Right basilar airspace disease is worrisome for pneumonia.   Electronically Signed   By: Drusilla Kanner M.D.   On: 09/24/2012 03:47   Ct Angio Head W/cm &/or Wo Cm  09/24/2012   CLINICAL DATA:  Evaluate for vasospasm.  EXAM: CT ANGIOGRAPHY HEAD  TECHNIQUE: Multidetector CT imaging of the head was performed using the standard protocol during bolus administration of intravenous contrast. Multiplanar CT image reconstructions including MIPs were obtained to evaluate the vascular anatomy.  CONTRAST:  50mL OMNIPAQUE IOHEXOL 350 MG/ML SOLN  COMPARISON:  None.  FINDINGS: Right Carotid system:  High-grade distal right ICA (paraclinoid and supra clinoid) narrowing, greater than 50%, consistent with vasospasm. There is a short segment of normalization followed by carotid terminus moderate stenosis. The right A1 is diffusely and severly stenotic compared to the preoperative  study. Right M1, beyond the bifurcation, high-grade narrowing diffusely. The distal vasculature is vasodilated in the setting of infarct.  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 The vessel is occluded distally.  Left carotid system: The left carotid terminus is up to 50% stenotic diffusldy. The left A1 appears relatively stable compared to the prior. The left M1 and M2 branches appear diffusely smaller than on preoperative study.  Posterior circulation: Extremely tortuous vertebrobasilar system with codominant vertebral arteries. There is a fetal type PCA on the left. No focal posterior circulation vasospasm identified.  Subacute right MCA territory infarction with extensive cortical enhancement and subcortical edema. Increased intracranial pressure with similar herniation through the craniectomy and sulcal effacement at the vertex. Similar volume of intraventricular hemorrhage, present predominantly within the 3rd and right lateral ventricle. There is some layering blood within the occipital horn left lateral ventricle. Ventricular enlargement is similar to prior. A left frontal EVD with surrounding mild hemorrhage and edema is stably positioned. Extra-axial hemorrhage in the right frontal region is unchanged at 9 mm, compressing the right frontal lobe. No evidence of acute ischemia.  Review of the MIP images confirms the above findings.  CriticalValue/emergent results were called by telephone at the time of interpretation on 09/24/2012 at 3:55 AMto Dr Venetia Maxon, who verbally acknowledged these results.  IMPRESSION: 1.  Multi focal vasospasm:  *Tandem high-grade distal right ICA stenoses. *Diffuse high-grade stenosis of the dominant right  A1 segment. *Extensive right M1 vasospasm (with downstream complete MCA territory infarction). *Diffuse mild/moderate narrowing of the distal left ICA, left A1 segment, and left M1 segment.  2. Angiographic  consideration: Extremely tortuous internal carotid arteries with focal luminal irregularity at the posterior genu right cavernous ICA.  3. 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 No evidence of re-hemorrhage.  4. Stable appearance of ventriculomegaly, intraventricular and extra-axial hemorrhage, and increased intracranial pressure.   Electronically Signed   By: Tiburcio Pea   On: 09/24/2012 04:06   Ct Head Wo Contrast  09/23/2012   CLINICAL DATA:  Neurologic status changes.  Intracranial hemorrhage.  EXAM: CT HEAD WITHOUT CONTRAST  TECHNIQUE: Contiguous axial images were obtained from the base of the skull through the vertex without intravenous contrast.  COMPARISON:  09/17/2012  FINDINGS: Left frontal ventriculostomy catheter noted with tip in the 3rd ventricle.  Compared to the prior exam, there is reduced hemorrhage in the left lateral ventricle and a reduced volume of hemorrhage in the 3rd ventricle. Evolutionary changes in the right lateral ventricular hemorrhage noted, with overall similar volume but more of the hemorrhage layering in the occipital horn.  Right frontotemporal craniectomy observed with subjective increase in bulging of intracranial contents along the craniectomy. There continues to be subdural hematoma along the right frontal bone and falx along with evolutionary findings in the subarachnoid and parenchymal hemorrhage along the right frontal and temporal lobes. The sulci remain effaced although the degree of white matter hypodensity in the right frontotemporal region is reduced. Currently no overt hydrocephalus scar at that there is stable minimal dilatation of the temporal horns of lateral ventricles. There is mild effacement of the right side of the suprasellar cistern without overt uncal herniation.  IMPRESSION: 1. Evolutionary changes include reduced severity of the edema in the right cerebral hemisphere and reduced hemorrhage in the left lateral ventricle and  3rd ventricle. However, there may be slightly more bulging along the craniectomy site.   Electronically Signed   By: Herbie Baltimore   On: 09/23/2012 17:33   Dg Chest Port 1 View  09/25/2012   CLINICAL DATA:  Evaluate infiltrates.  EXAM: PORTABLE CHEST - 1 VIEW  COMPARISON:  09/24/2012.  FINDINGS: Tracheostomy tube tip midline.  Left central line tip in the proximal superior vena cava level.  Asymmetric airspace disease greater on the left may represent mild pulmonary vascular congestion/ pulmonary edema. Infectious infiltrate not excluded in proper clinical setting.  Elevated right hemidiaphragm with right lung base atelectasis versus infiltrate.  No gross pneumothorax.  IMPRESSION: Asymmetric airspace disease greater on the left may represent mild pulmonary vascular congestion/ pulmonary edema. Infectious infiltrate not excluded in proper clinical setting.  Elevated right hemidiaphragm with right lung base atelectasis versus infiltrate.   Electronically Signed   By: Bridgett Larsson   On: 09/25/2012 07:23   Dg Chest Port 1 View  09/24/2012   CLINICAL DATA:  History of intracranial hemorrhage.  EXAM: PORTABLE CHEST - 1 VIEW  COMPARISON:  Single view of the chest 09/23/2012.  FINDINGS: Tracheostomy and left subclavian catheter are again seen. Right basilar airspace disease has worsened. The left lung appears clear. No pneumothorax is identified.  IMPRESSION: Increased right basilar airspace disease which could represent atelectasis or pneumonia.   Electronically Signed   By: Drusilla Kanner M.D.   On: 09/24/2012 06:22   Chest Portable 1 View To Assess Tube Placement And Rule-out Pneumothorax  09/23/2012   CLINICAL DATA:  Tube placement and rule out pneumothorax.  EXAM: PORTABLE CHEST - 1 VIEW  COMPARISON:  09/23/2012  FINDINGS: Patient has a left subclavian central line, tip overlying the level of the superior vena cava. Tracheostomy tube has been placed, tip approximately 6 cm above the carinal.  Heart size  is normal. There has been development of increased right basilar atelectasis. There is associated mediastinal shift toward the right. No evidence for pneumothorax.  IMPRESSION: 1. Interval placement tracheostomy tube. 2. Interval increased right basilar atelectasis with significant volume loss.   Electronically Signed   By: Rosalie Gums M.D.   On: 09/23/2012 14:35    Anti-infectives: Anti-infectives   Start     Dose/Rate Route Frequency Ordered Stop   09/23/12 0200  vancomycin (VANCOCIN) 500 mg in sodium chloride 0.9 % 100 mL IVPB     500 mg 100 mL/hr over 60 Minutes Intravenous Every 8 hours 09/22/12 1743     09/22/12 1800  piperacillin-tazobactam (ZOSYN) IVPB 3.375 g     3.375 g 12.5 mL/hr over 240 Minutes Intravenous Every 8 hours 09/22/12 1744     09/22/12 1745  vancomycin (VANCOCIN) IVPB 750 mg/150 ml premix     750 mg 150 mL/hr over 60 Minutes Intravenous  Once 09/22/12 1743 09/22/12 1907   09/12/12 1053  bacitracin 50,000 Units in sodium chloride irrigation 0.9 % 500 mL irrigation  Status:  Discontinued       As needed 09/12/12 1054 09/12/12 1212   09/12/12 0800  Ampicillin-Sulbactam (UNASYN) 3 g in sodium chloride 0.9 % 100 mL IVPB  Status:  Discontinued     3 g 100 mL/hr over 60 Minutes Intravenous Every 8 hours 09/12/12 0152 09/12/12 0746   09/12/12 0800  Ampicillin-Sulbactam (UNASYN) 3 g in sodium chloride  0.9 % 100 mL IVPB  Status:  Discontinued     3 g 100 mL/hr over 60 Minutes Intravenous 4 times per day 09/12/12 0746 09/21/12 1155   09/12/12 0200  Ampicillin-Sulbactam (UNASYN) 3 g in sodium chloride 0.9 % 100 mL IVPB     3 g 100 mL/hr over 60 Minutes Intravenous  Once 09/12/12 0152 09/12/12 0336   09/11/12 2308  ceFAZolin (ANCEF) 1-5 GM-% IVPB    Comments:  Aydelette, Jamie   : cabinet override      09/11/12 2308 09/12/12 1114   09/11/12 1945  bacitracin 50,000 Units in sodium chloride irrigation 0.9 % 500 mL irrigation  Status:  Discontinued       As needed 09/11/12 2050  09/12/12 0030   09/11/12 1923  ceFAZolin (ANCEF) 2-3 GM-% IVPB SOLR    Comments:  Toney Sang   : cabinet override      09/11/12 1923 09/11/12 2310      Assessment/Plan: s/p Procedure(s): Right decompressive craniectomy and placement of boneflap in right lower quadrant (Right) S/P PEG - had some bleeding afterward but seems to be stabilizing, abdominal exam benign and site OK, tol TF  LOS: 14 days    Shiah Berhow E 09/25/2012

## 2012-09-25 NOTE — Progress Notes (Signed)
Pt seen and examined. No issues last pm.   EXAM: Temp:  [97 F (36.1 C)-102.1 F (38.9 C)] 102.1 F (38.9 C) (09/25 0400) Pulse Rate:  [92-118] 93 (09/25 0700) Resp:  [18-48] 25 (09/25 0700) BP: (111-158)/(45-96) 122/52 mmHg (09/25 0700) SpO2:  [98 %-100 %] 99 % (09/25 0700) FiO2 (%):  [30 %] 30 % (09/25 0400) Weight:  [57.3 kg (126 lb 5.2 oz)] 57.3 kg (126 lb 5.2 oz) (09/25 0342) Intake/Output     09/24 0701 - 09/25 0700 09/25 0701 - 09/26 0700   I.V. (mL/kg) 5125.6 (89.5)    Blood 264.5    Other 80    NG/GT 845.7    IV Piggyback 650    Total Intake(mL/kg) 6965.8 (121.6)    Urine (mL/kg/hr) 1680 (1.2)    Drains 255 (0.2)    Stool 550 (0.4)    Total Output 2485     Net +4480.8            EVD open at 10, bloody CSF draining, 255cc x 24 hrs. Easily arousable to noxious stimulit Intubated, breathing over vent Follows commands after stimulation in RUE/RLE. No movement LUE/LLE Wound c/d/i  LABS: Lab Results  Component Value Date   CREATININE 0.48* 09/25/2012   BUN 25* 09/25/2012   NA 136 09/25/2012   K 3.2* 09/25/2012   CL 107 09/25/2012   CO2 18* 09/25/2012   Lab Results  Component Value Date   WBC 20.5* 09/25/2012   HGB 8.6* 09/25/2012   HCT 24.0* 09/25/2012   MCV 79.5 09/25/2012   PLT 133* 09/25/2012    IMAGING: TCD reviewed, decreasing bilateral MCA velocities with slightly increased LACA velocity CXR with poss slight pulmonary vascular congestion  IMPRESSION: - 27 y.o. female SAH d# 14 s/p RMCA clipping and craniectomy - Clinical vasospasm responsive to hypertension/hyervolemia - Hgb trending down again, likely dilutional - Persistently febrile  PLAN: - Maintain IVF hydration at 200cc/hr, Pressors for goal MAP > 80. If exam stable, can wean pressors and decrease fluids tomorrow as pt is exiting vasospasm period.  - EVD raised to 15 - Cont nimotop/zocor - Leukocytosis stable. Cont empiric abx (Zosyn/Vanc). Will also send C-diff and check BLE doppler - Vent  mgmt per CCM. Pulmonary congestion on CXR does not appear to have affected respiratory status currently.

## 2012-09-25 NOTE — Progress Notes (Signed)
[facility] ADULT ICU REPLACEMENT PROTOCOL FOR AM LAB REPLACEMENT ONLY  The patient does apply for the [facility] Adult ICU Electrolyte Replacment Protocol based on the criteria listed below:   1. Is GFR >/= 40 ml/min? yes  Patient's GFR today is >90 2. Is urine output >/= 0.5 ml/kg/hr for the last 6 hours? yes Patient's UOP is 1.42 ml/kg/hr 3. Is BUN < 60 mg/dL? yes  Patient's BUN today is 25 4. Abnormal electrolyte(s): K+ 3.2 5. Ordered repletion with: see order 6. If a panic level lab has been reported, has the CCM MD in charge been notified? yes.   Physician:  Dr. Higinio Plan, Kailee Essman A 09/25/2012 5:30 AM

## 2012-09-25 NOTE — Progress Notes (Signed)
1610 Paged Elink MD to notify of MAP in the 60's below the goal of 80 for an hr. Dr. Darrick Penna requested I consult with neurosurgery. I paged Dr. Conchita Paris and no orders were given at this time. Will continue to monitor pressure.

## 2012-09-25 NOTE — Progress Notes (Signed)
UR completed.  Ziara Thelander, RN BSN [facility] CCM Trauma/Neuro ICU Case Manager [phone]  

## 2012-09-25 NOTE — Progress Notes (Signed)
VASCULAR LAB PRELIMINARY  PRELIMINARY  PRELIMINARY  PRELIMINARY  Bilateral lower extremity venous Dopplers completed.    Preliminary report:  There is no DVT or SVT noted in the bilateral lower extremities.  Jowanna Loeffler, RVT 09/25/2012, 10:30 AM

## 2012-09-25 NOTE — Progress Notes (Signed)
ANTIBIOTIC CONSULT NOTE - FOLLOW UP  Pharmacy Consult for vancomycin and Zosyn Indication: pneumonia (CAP vs aspiration)  Allergies  Allergen Reactions  . Contrast Media [Iodinated Diagnostic Agents] Hives    Patient Measurements: Height: 5\' 1"  (154.9 cm) Weight: 126 lb 5.2 oz (57.3 kg) IBW/kg (Calculated) : 47.8  Vital Signs: Temp: 99.8 F (37.7 C) (09/25 1200) Temp src: Oral (09/25 1200) BP: 135/61 mmHg (09/25 1230) Pulse Rate: 115 (09/25 1230) Intake/Output from previous day: 09/24 0701 - 09/25 0700 In: 6965.8 [I.V.:5125.6; Blood:264.5; NG/GT:845.7; IV Piggyback:650] Out: 2485 [Urine:1680; Drains:255; Stool:550] Intake/Output from this shift: Total I/O In: 1817 [I.V.:1234.5; NG/GT:445; IV Piggyback:137.5] Out: 684 [Urine:650; Drains:34]  Labs:  Recent Labs  09/23/12 2025  09/24/12 0407 09/24/12 0845 09/24/12 1700 09/25/12 0400  WBC 26.3*  < > 27.7* 25.7* 22.1* 20.5*  HGB 6.1*  < > 9.9* 10.9* 9.1* 8.6*  PLT 201  < > 165 178 135* 133*  CREATININE 0.56  --  0.61  --   --  0.48*  < > = values in this interval not displayed. Estimated Creatinine Clearance: 81.8 ml/min (by C-G formula based on Cr of 0.48).  Recent Labs  09/25/12 0900  VANCOTROUGH 8.6*     Microbiology: Recent Results (from the past 720 hour(s))  MRSA PCR SCREENING     Status: None   Collection Time    09/11/12  1:32 PM      Result Value Range Status   MRSA by PCR NEGATIVE  NEGATIVE Final   Comment:            The GeneXpert MRSA Assay (FDA     approved for NASAL specimens     only), is one component of a     comprehensive MRSA colonization     surveillance program. It is not     intended to diagnose MRSA     infection nor to guide or     monitor treatment for     MRSA infections.  CULTURE, BLOOD (ROUTINE X 2)     Status: None   Collection Time    09/12/12  3:48 AM      Result Value Range Status   Specimen Description BLOOD RIGHT ANTECUBITAL   Final   Special Requests BOTTLES DRAWN  AEROBIC ONLY 5CC   Final   Culture  Setup Time     Final   Value: 09/12/2012 11:37     Performed at [facility]   Culture     Final   Value: NO GROWTH 5 DAYS     Performed at [facility]   Report Status 09/18/2012 FINAL   Final  CULTURE, BLOOD (ROUTINE X 2)     Status: None   Collection Time    09/12/12  4:00 AM      Result Value Range Status   Specimen Description BLOOD RIGHT HAND   Final   Special Requests BOTTLES DRAWN AEROBIC ONLY 5CC   Final   Culture  Setup Time     Final   Value: 09/12/2012 11:35     Performed at [facility]   Culture     Final   Value: NO GROWTH 5 DAYS     Performed at [facility]   Report Status 09/18/2012 FINAL   Final  CULTURE, RESPIRATORY (NON-EXPECTORATED)     Status: None   Collection Time    09/12/12  8:15 AM      Result Value Range Status   Specimen Description  TRACHEAL ASPIRATE   Final   Special Requests NONE   Final   Gram Stain     Final   Value: NO WBC SEEN     NO SQUAMOUS EPITHELIAL CELLS SEEN     RARE GRAM NEGATIVE COCCI     Performed at [facility]   Culture     Final   Value: Non-Pathogenic Oropharyngeal-type Flora Isolated.     Performed at [facility]   Report Status 09/14/2012 FINAL   Final  CLOSTRIDIUM DIFFICILE BY PCR     Status: None   Collection Time    09/14/12 10:52 PM      Result Value Range Status   C difficile by pcr NEGATIVE  NEGATIVE Final  URINE CULTURE     Status: None   Collection Time    09/17/12  5:53 PM      Result Value Range Status   Specimen Description URINE, CATHETERIZED   Final   Special Requests Unasyn Normal   Final   Culture  Setup Time     Final   Value: 09/17/2012 18:49     Performed at [facility] Count     Final   Value: NO GROWTH     Performed at [facility]   Culture     Final   Value: NO GROWTH     Performed at [facility]   Report Status 09/19/2012 FINAL   Final  CSF CULTURE     Status: None    Collection Time    09/22/12  8:05 AM      Result Value Range Status   Specimen Description CSF   Final   Special Requests NONE   Final   Gram Stain     Final   Value: CYTOSPIN WBC PRESENT,BOTH PMN AND MONONUCLEAR     NO ORGANISMS SEEN     Performed at [facility]     Performed at [facility]   Culture     Final   Value: NO GROWTH 2 DAYS     Performed at [facility]   Report Status PENDING   Incomplete  GRAM STAIN     Status: None   Collection Time    09/22/12  8:05 AM      Result Value Range Status   Specimen Description CSF   Final   Special Requests 2.5CC   Final   Gram Stain     Final   Value: CYTOSPIN SLIDE:     WBC PRESENT,BOTH PMN AND MONONUCLEAR     NO ORGANISMS SEEN   Report Status 09/22/2012 FINAL   Final  CULTURE, BLOOD (ROUTINE X 2)     Status: None   Collection Time    09/22/12  5:28 PM      Result Value Range Status   Specimen Description BLOOD LEFT HAND   Final   Special Requests BOTTLES DRAWN AEROBIC ONLY 8CC   Final   Culture  Setup Time     Final   Value: 09/22/2012 21:57     Performed at [facility]   Culture     Final   Value:        BLOOD CULTURE RECEIVED NO GROWTH TO DATE CULTURE WILL BE HELD FOR 5 DAYS BEFORE ISSUING A FINAL NEGATIVE REPORT     Performed at [facility]   Report Status PENDING   Incomplete  URINE CULTURE     Status:  None   Collection Time    09/22/12  6:17 PM      Result Value Range Status   Specimen Description URINE, CATHETERIZED   Final   Special Requests NONE   Final   Culture  Setup Time     Final   Value: 09/22/2012 20:40     Performed at [facility] Count     Final   Value: >=100,000 COLONIES/ML     Performed at [facility]   Culture     Final   Value: ESCHERICHIA COLI     Performed at [facility]   Report Status 09/24/2012 FINAL   Final   Organism ID, Bacteria ESCHERICHIA COLI   Final  CULTURE, BLOOD (ROUTINE X 2)     Status: None    Collection Time    09/22/12  6:26 PM      Result Value Range Status   Specimen Description BLOOD LEFT ARM   Final   Special Requests BOTTLES DRAWN AEROBIC ONLY 10CC   Final   Culture  Setup Time     Final   Value: 09/22/2012 21:57     Performed at [facility]   Culture     Final   Value:        BLOOD CULTURE RECEIVED NO GROWTH TO DATE CULTURE WILL BE HELD FOR 5 DAYS BEFORE ISSUING A FINAL NEGATIVE REPORT     Performed at [facility]   Report Status PENDING   Incomplete  CULTURE, RESPIRATORY (NON-EXPECTORATED)     Status: None   Collection Time    09/24/12  2:32 PM      Result Value Range Status   Specimen Description TRACHEAL ASPIRATE   Final   Special Requests NONE   Final   Gram Stain     Final   Value: FEW WBC PRESENT,BOTH PMN AND MONONUCLEAR     NO SQUAMOUS EPITHELIAL CELLS SEEN     NO ORGANISMS SEEN     Performed at [facility]   Culture     Final   Value: NO GROWTH 1 DAY     Performed at [facility]   Report Status PENDING   Incomplete  CLOSTRIDIUM DIFFICILE BY PCR     Status: None   Collection Time    09/25/12  9:00 AM      Result Value Range Status   C difficile by pcr NEGATIVE  NEGATIVE Final    Anti-infectives   Start     Dose/Rate Route Frequency Ordered Stop   09/23/12 0200  vancomycin (VANCOCIN) 500 mg in sodium chloride 0.9 % 100 mL IVPB     500 mg 100 mL/hr over 60 Minutes Intravenous Every 8 hours 09/22/12 1743     09/22/12 1800  piperacillin-tazobactam (ZOSYN) IVPB 3.375 g     3.375 g 12.5 mL/hr over 240 Minutes Intravenous Every 8 hours 09/22/12 1744     09/22/12 1745  vancomycin (VANCOCIN) IVPB 750 mg/150 ml premix     750 mg 150 mL/hr over 60 Minutes Intravenous  Once 09/22/12 1743 09/22/12 1907   09/12/12 1053  bacitracin 50,000 Units in sodium chloride irrigation 0.9 % 500 mL irrigation  Status:  Discontinued       As needed 09/12/12 1054 09/12/12 1212   09/12/12 0800  Ampicillin-Sulbactam (UNASYN) 3 g in sodium  chloride 0.9 % 100 mL IVPB  Status:  Discontinued     3 g 100 mL/hr over  60 Minutes Intravenous Every 8 hours 09/12/12 0152 09/12/12 0746   09/12/12 0800  Ampicillin-Sulbactam (UNASYN) 3 g in sodium chloride 0.9 % 100 mL IVPB  Status:  Discontinued     3 g 100 mL/hr over 60 Minutes Intravenous 4 times per day 09/12/12 0746 09/21/12 1155   09/12/12 0200  Ampicillin-Sulbactam (UNASYN) 3 g in sodium chloride 0.9 % 100 mL IVPB     3 g 100 mL/hr over 60 Minutes Intravenous  Once 09/12/12 0152 09/12/12 0336   09/11/12 2308  ceFAZolin (ANCEF) 1-5 GM-% IVPB    Comments:  Aydelette, Jamie   : cabinet override      09/11/12 2308 09/12/12 1114   09/11/12 1945  bacitracin 50,000 Units in sodium chloride irrigation 0.9 % 500 mL irrigation  Status:  Discontinued       As needed 09/11/12 2050 09/12/12 0030   09/11/12 1923  ceFAZolin (ANCEF) 2-3 GM-% IVPB SOLR    Comments:  Toney Sang   : cabinet override      09/11/12 1923 09/11/12 2310      Assessment: 27 yo female on day 4 vancomycin and Zosyn for PNA (aspiration favored). Vancomycin trough this morning is subtherapeutic at 8.6 on 500 mg q8h. Renal function is stable and UOP is good. Patient is febrile with Tmax 102.1, WBC are elevated but trending down, and the urine culture showed E coli (sensitive to Zosyn).  Goal of Therapy:  Vancomycin trough level 15-20 mcg/ml  Plan:  - Increase vancomycin to 1000 mg IV q8h - Continue Zosyn 3.375 g IV q8h (infused over 4 hours) - Monitor renal function, clinical progress and culture data - Vancomycin trough at steady-state  [facility], [place].D., BCPS Clinical Pharmacist Pager: [phone] 09/25/2012 1:14 PM

## 2012-09-25 NOTE — Progress Notes (Signed)
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 Following commands.  No events overnight.  Spoke with father extensively today, informed him that the patient will likely need trach but will need to speak with NS prior to proceeding further with either extubation or trach/peg to determine prognosis.  Father is ok with whatever recommendations we make. 9/20 - Waxing and waning mental status. More drowsy. Doing SBT but not extubation candidate 9/23- PEG Tube, Tracheostomy 9/23- Decreased LOC, hypotensive, increased leukocytosis, decreased Hgb. No new acute Hem or HCP on CT. Pt was given 3U PRBC and Levophed.   SUBJECTIVE/OVERNIGHT/INTERVAL HX:   No events overnight.  VITAL SIGNS: Temp:  [97 F (36.1 C)-102.1 F (38.9 C)] 98.6 F (37 C) (09/25 0800) Pulse Rate:  [92-118] 92 (09/25 0900) Resp:  [18-48] 19 (09/25 0900) BP: (109-158)/(42-96) 109/42 mmHg (09/25 0900) SpO2:  [98 %-100 %] 100 % (09/25 0900) FiO2 (%):  [30 %] 30 % (09/25 0800) Weight:  [57.3 kg (126 lb 5.2  oz)] 57.3 kg (126 lb 5.2 oz) (09/25 0342)  VENTILATOR SETTINGS: Vent Mode:  [-] PRVC FiO2 (%):  [30 %] 30 % Set Rate:  [18 bmp] 18 bmp Vt Set:  [380 mL-400 mL] 400 mL PEEP:  [5 cmH20] 5 cmH20 Pressure Support:  [15 cmH20] 15 cmH20 Plateau Pressure:  [9 cmH20-19 cmH20] 11 cmH20  INTAKE / OUTPUT: Intake/Output     09/24 0701 - 09/25 0700 09/25 0701 - 09/26 0700   I.V. (mL/kg) 5125.6 (89.5) 493.8 (8.6)   Blood 264.5    Other 80    NG/GT 845.7 180   IV Piggyback 650    Total Intake(mL/kg) 6965.8 (121.6) 673.8 (11.8)   Urine (mL/kg/hr) 1680 (1.2) 120 (0.7)   Drains 255 (0.2) 4 (0)   Stool 550 (0.4)    Total Output 2485 124   Net +4480.8 +549.8         PHYSICAL EXAMINATION: Gen: young female, NAD on vent  HEENT: scalp dressing in place, drain in place, ETT in place PULM: resps even non labored on TC CV: s1 s2 RRR, loud diastolic/systolic murmur, no JVD AB: BS+, soft, nontender, no hsm, PEG tube in place Ext: warm, no edema, no clubbing, no cyanosis Derm: no rash or skin breakdown Neuro: eyes open, no response to verbal command, occ follow commands  LABS: PULMONARY  Recent Labs Lab 09/23/12 0354 09/23/12 1934 09/24/12 1420  PHART 7.460* 7.509* 7.419  PCO2ART 31.8* 27.3* 28.6*  PO2ART 32.0* 134.0* 132.0*  HCO3 22.4 21.4 18.2*  TCO2 23 22.2 19.0  O2SAT 63.0 99.1 99.4  CBC  Recent Labs Lab 09/24/12 0845 09/24/12 1700 09/25/12 0400  HGB 10.9* 9.1* 8.6*  HCT 30.5* 25.5* 24.0*  WBC 25.7* 22.1* 20.5*  PLT 178 135* 133*   CHEMISTRY  Recent Labs Lab 09/19/12 0200  09/20/12 0214  09/21/12 0145  09/22/12 0540 09/23/12 0455 09/23/12 2025 09/24/12 0407 09/25/12 0400  NA 156*  < > 152*  < > 147*  < > 142 138 140 136 136  K 4.2  --  3.6  --  3.2*  --  3.5 4.1 3.8 3.8 3.2*  CL 122*  --  117*  --  113*  --  110 105 106 105 107  CO2 26  --  27  --  24  --  22 22 21 19  18*  GLUCOSE 131*  --  125*  --  114*  --  125* 110* 122* 202* 165*  BUN 33*  --  33*  --   32*  --  31* 31* 32* 31* 25*  CREATININE 0.62  --  0.52  --  0.55  --  0.53 0.54 0.56 0.61 0.48*  CALCIUM 9.5  --  9.2  --  9.5  --  9.3 9.5 8.2* 8.7 8.2*  MG 2.5  --  2.3  --  2.1  --   --  2.1  --  2.1  --   PHOS 4.9*  --  4.2  --  4.1  --   --  4.6  --  4.1  --   < > = values in this interval not displayed. Estimated Creatinine Clearance: 81.8 ml/min (by C-G formula based on Cr of 0.48).  ENDOCRINE CBG (last 3)   Recent Labs  09/24/12 1557 09/25/12 0017 09/25/12 0802  GLUCAP 155* 122* 174*    IMAGING x48h  Ct Abdomen Pelvis Wo Contrast  09/24/2012   CLINICAL DATA:  Decreased hemoglobin. Question intra-abdominal bleeding. Status post gastrostomy tube placement 09/23/2012.  EXAM: CT ABDOMEN AND PELVIS WITHOUT CONTRAST  TECHNIQUE: Multidetector CT imaging of the abdomen and pelvis was performed following the standard protocol without intravenous contrast.  COMPARISON:  None.  FINDINGS: Right basilar airspace disease with air bronchograms present is identified. Small right pleural effusion is present. There is no left pleural effusion or pericardial effusion.  Scattered abdominal ascites is identified. There is a hematocrit level in the pelvis consistent with intra-abdominal bleeding. The largest collection of hemorrhage is in the left upper quadrant of the abdomen anterior to the spleen along the greater curvature of the stomach. The liver, spleen, adrenal glands, pancreas and kidneys appear normal. Gastrostomy tube is in place in the stomach. A Foley catheter is in place in a decompressed urinary bladder and a rectal tube is identified. The colon is fluid filled. There is no evidence of bowel obstruction. No free intraperitoneal air is identified. There is no lymphadenopathy. Small exostosis off the right iliac wing is noted. Bone flaps from craniotomy from craniotomy in the anterior abdominal wall are noted. No worrisome bony abnormality is identified.  IMPRESSION: Scattered hemorrhage within  the abdomen appearing most confluent in the left upper quadrant anterior to the spleen and along the greater curvature of the stomach is likely related to recent gastrostomy tube placement.  Right basilar airspace disease is worrisome for pneumonia.   Electronically Signed   By: Drusilla Kanner M.D.   On: 09/24/2012 03:47   Ct Angio Head W/cm &/or Wo Cm  09/24/2012   CLINICAL DATA:  Evaluate for vasospasm.  EXAM: CT ANGIOGRAPHY HEAD  TECHNIQUE: Multidetector CT imaging of the head was performed using the standard protocol during bolus administration of intravenous contrast. Multiplanar CT image reconstructions including MIPs were obtained to evaluate the vascular anatomy.  CONTRAST:  50mL OMNIPAQUE IOHEXOL 350 MG/ML SOLN  COMPARISON:  None.  FINDINGS: Right Carotid system: High-grade distal right ICA (paraclinoid and supra clinoid) narrowing, greater than 50%, consistent with vasospasm. There is a short segment of normalization followed by carotid terminus moderate stenosis. The right A1 is diffusely and severly stenotic compared to the preoperative study. Right M1, beyond the bifurcation, high-grade narrowing diffusely. The distal vasculature is vasodilated in the setting of infarct.  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 The vessel is occluded distally.  Left carotid system: The left carotid terminus is up to 50% stenotic diffusldy. The left A1 appears relatively stable compared to the prior. The left M1 and M2 branches appear diffusely smaller than on preoperative study.  Posterior circulation: Extremely tortuous vertebrobasilar system with codominant vertebral arteries. There is a fetal type PCA on the left. No focal posterior circulation vasospasm identified.  Subacute right MCA territory infarction with extensive cortical enhancement and subcortical edema. Increased intracranial pressure with similar herniation through the craniectomy  and sulcal effacement at the vertex. Similar volume of intraventricular hemorrhage, present predominantly within the 3rd and right lateral ventricle. There is some layering blood within the occipital horn left lateral ventricle. Ventricular enlargement is similar to prior. A left frontal EVD with surrounding mild hemorrhage and edema is stably positioned. Extra-axial hemorrhage in the right frontal region is unchanged at 9 mm, compressing the right frontal lobe. No evidence of acute ischemia.  Review of the MIP images confirms the above findings.  CriticalValue/emergent results were called by telephone at the time of interpretation on 09/24/2012 at 3:55 AMto Dr Venetia Maxon, who verbally acknowledged these results.  IMPRESSION: 1.  Multi focal vasospasm:  *Tandem high-grade distal right ICA stenoses. *Diffuse high-grade stenosis of the dominant right  A1 segment. *Extensive right M1 vasospasm (with downstream complete MCA territory infarction). *Diffuse mild/moderate narrowing of the distal left ICA, left A1 segment, and left M1 segment.  2. Angiographic consideration: Extremely tortuous internal carotid arteries with focal luminal irregularity at the posterior genu right cavernous ICA.  3. 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 No evidence of re-hemorrhage.  4. Stable appearance of ventriculomegaly, intraventricular and extra-axial hemorrhage, and increased intracranial pressure.   Electronically Signed   By: Tiburcio Pea   On: 09/24/2012 04:06   Ct Head Wo Contrast  09/23/2012   CLINICAL DATA:  Neurologic status changes.  Intracranial hemorrhage.  EXAM: CT HEAD WITHOUT CONTRAST  TECHNIQUE: Contiguous axial images were obtained from the base of the skull through the vertex without intravenous contrast.  COMPARISON:  09/17/2012  FINDINGS: Left frontal ventriculostomy catheter noted with tip in the 3rd ventricle.  Compared to the prior exam, there is reduced hemorrhage in the left lateral  ventricle and a reduced volume of hemorrhage in the 3rd ventricle. Evolutionary changes in the right lateral ventricular hemorrhage noted, with overall similar volume but more of the hemorrhage layering in the occipital horn.  Right frontotemporal craniectomy observed with subjective increase in bulging of intracranial contents along the craniectomy. There continues to be subdural hematoma along the right frontal bone and falx along with evolutionary findings in the subarachnoid and parenchymal hemorrhage along the right frontal and temporal  lobes. The sulci remain effaced although the degree of white matter hypodensity in the right frontotemporal region is reduced. Currently no overt hydrocephalus scar at that there is stable minimal dilatation of the temporal horns of lateral ventricles. There is mild effacement of the right side of the suprasellar cistern without overt uncal herniation.  IMPRESSION: 1. Evolutionary changes include reduced severity of the edema in the right cerebral hemisphere and reduced hemorrhage in the left lateral ventricle and 3rd ventricle. However, there may be slightly more bulging along the craniectomy site.   Electronically Signed   By: Herbie Baltimore   On: 09/23/2012 17:33   Dg Chest Port 1 View  09/25/2012   CLINICAL DATA:  Evaluate infiltrates.  EXAM: PORTABLE CHEST - 1 VIEW  COMPARISON:  09/24/2012.  FINDINGS: Tracheostomy tube tip midline.  Left central line tip in the proximal superior vena cava level.  Asymmetric airspace disease greater on the left may represent mild pulmonary vascular congestion/ pulmonary edema. Infectious infiltrate not excluded in proper clinical setting.  Elevated right hemidiaphragm with right lung base atelectasis versus infiltrate.  No gross pneumothorax.  IMPRESSION: Asymmetric airspace disease greater on the left may represent mild pulmonary vascular congestion/ pulmonary edema. Infectious infiltrate not excluded in proper clinical setting.   Elevated right hemidiaphragm with right lung base atelectasis versus infiltrate.   Electronically Signed   By: Bridgett Larsson   On: 09/25/2012 07:23   Dg Chest Port 1 View  09/24/2012   CLINICAL DATA:  History of intracranial hemorrhage.  EXAM: PORTABLE CHEST - 1 VIEW  COMPARISON:  Single view of the chest 09/23/2012.  FINDINGS: Tracheostomy and left subclavian catheter are again seen. Right basilar airspace disease has worsened. The left lung appears clear. No pneumothorax is identified.  IMPRESSION: Increased right basilar airspace disease which could represent atelectasis or pneumonia.   Electronically Signed   By: Drusilla Kanner M.D.   On: 09/24/2012 06:22   Chest Portable 1 View To Assess Tube Placement And Rule-out Pneumothorax  09/23/2012   CLINICAL DATA:  Tube placement and rule out pneumothorax.  EXAM: PORTABLE CHEST - 1 VIEW  COMPARISON:  09/23/2012  FINDINGS: Patient has a left subclavian central line, tip overlying the level of the superior vena cava. Tracheostomy tube has been placed, tip approximately 6 cm above the carinal.  Heart size is normal. There has been development of increased right basilar atelectasis. There is associated mediastinal shift toward the right. No evidence for pneumothorax.  IMPRESSION: 1. Interval placement tracheostomy tube. 2. Interval increased right basilar atelectasis with significant volume loss.   Electronically Signed   By: Rosalie Gums M.D.   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 - HHH therapy, levophed for episode of spasm yesterday - CVP 14. - TCD per NS. - Close follow Na  PULMONARY A:  Post op respiratory failure > s/p trach 9/23, on vent FiO2 30%, O2 sat 100% CAP vs aspiration pneumonia (favor aspiration) - 9/24 CXR, increased opacificationRLL Asthma - without exacerbation P:   - RLL  infiltrate with elevation in WBC. - See ID section. - Maintain on TC as tolerated.  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 HTN - Echo with grade 2 diastolic dysfunction, thickening and  EF of 60-65%. 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 - Levophed for BP support as a part of HHH therapy. - MAP goals 85-105. - CVP of 14, will continue to follow.  RENAL A:   Hyponatremia, mild in setting brain edema P:   - F/u chem in am  - Avoid free water, brain edema - NO lasix with HHH - Continue IVF.  GASTROINTESTINAL A:   Dysphagia - in setting of AMS / Resp fx No active bleeding noted P:   - Tf as tolerated. - Pantoprazole  HEMATOLOGIC A:   Anemia, s/p 3U PRBC 9/24 > good response P:  - Monitor h/h - Transfusion threshold < 7gm/dl - SCD  INFECTIOUS A:   Aspiration pneumonia.  9/24 Febrile, chest Xray shows worsening of RLL opacification  UTI P:   - Recultured 9/22 and pending. - F/U CSF culture.   - Vanc/zosyn per pharmacy. - pCXR in am to re eval infiltrate rt. - Changed foley.  ENDOCRINE A:   Single episode of hyperglycemia, may be transient due to steroids  P:   - Monitor glucose.  The patient is critically ill with multiple organ systems failure and requires high complexity decision making for assessment and support, frequent evaluation and titration of therapies, application of advanced monitoring technologies and extensive interpretation of multiple databases.   CC time 35 minutes.  Alyson Reedy, M.D. [facility] Pulmonary/Critical Care Medicine. Pager: [phone]. After hours pager: [phone].

## 2012-09-25 NOTE — Progress Notes (Signed)
NUTRITION FOLLOW UP  Intervention:    1. Change TF regimen to Jevity 1.2 @ 60 ml/hr  2. 30 ml Prostat daily  Tube feeding regimen provides 1828 kcal, 95 grams of protein, and 1167 ml of H2O.    Nutrition Dx:   Inadequate oral intake related to inability to eat as evidenced by NPO status; ongoing.    Goal:  Intake to meet >90% of estimated nutrition needs, met.   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 Pt may need VP shunt. Pt unable to be extubated. Trach and PEG 9/23. Pt now on trach collar.    Height: Ht Readings from Last 1 Encounters:  09/11/12 5\' 1"  (1.549 m)    Weight Status:   Wt Readings from Last 1 Encounters:  09/25/12 126 lb 5.2 oz (57.3 kg)  Admission weight: 118 lb 9/11  Re-estimated needs:  Kcal: 1700-1900 Protein: 80-100 grams  Fluid: >1.5 L/day  Skin: head and abd incision  Diet Order: NPO   Intake/Output Summary (Last 24 hours) at 09/25/12 1533 Last data filed at 09/25/12 1400  Gross per 24 hour  Intake 6946.8 ml  Output   2883 ml  Net 4063.8 ml    Last BM: via rectal pouch which was inserted 9/14 425 ml 9/19 200 ml and x 3 9/20 150 ml 9/21 700 ml 9/22  Labs:   Recent Labs Lab 09/21/12 0145  09/23/12 0455 09/23/12 2025 09/24/12 0407 09/25/12 0400  NA 147*  < > 138 140 136 136  K 3.2*  < > 4.1 3.8 3.8 3.2*  CL 113*  < > 105 106 105 107  CO2 24  < > 22 21 19  18*  BUN 32*  < > 31* 32* 31* 25*  CREATININE 0.55  < > 0.54 0.56 0.61 0.48*  CALCIUM 9.5  < > 9.5 8.2* 8.7 8.2*  MG 2.1  --  2.1  --  2.1  --   PHOS 4.1  --  4.6  --  4.1  --   GLUCOSE 114*  < > 110* 122* 202* 165*  < > = values in this interval not displayed.  CBG (last 3)   Recent Labs  09/24/12 1557 09/25/12 0017 09/25/12 0802  GLUCAP 155* 122* 174*    Scheduled Meds: . antiseptic oral rinse  15 mL Mouth Rinse QID  . chlorhexidine  15 mL Mouth  Rinse BID  . feeding supplement (VITAL AF 1.2 CAL)  1,000 mL Per Tube Q24H  . heparin subcutaneous  5,000 Units Subcutaneous Q8H  . insulin aspart  2-6 Units Subcutaneous Q4H  . levETIRAcetam  500 mg Intravenous Q12H  . NiMODipine  60 mg Per Tube Q4H   Or  . niMODipine  60 mg Oral Q4H  . pantoprazole sodium  40 mg Per Tube Q1200  . piperacillin-tazobactam (ZOSYN)  IV  3.375 g Intravenous Q8H  . potassium chloride      . predniSONE  50 mg Oral Once  . propofol  5-70 mcg/kg/min Intravenous Once  . senna-docusate  1 tablet Oral BID  . simvastatin  80 mg Oral QHS  . vancomycin  1,000 mg Intravenous Q8H    Continuous Infusions: . sodium chloride 20 mL/hr at 09/19/12 0700  . sodium chloride 200 mL/hr at 09/25/12 1048  . sodium chloride 20 mL/hr at 09/24/12 0027  . norepinephrine (LEVOPHED) Adult infusion 50 mcg/min (09/25/12 1400)  [place], [place], [place] Pager [phone] After Hours Pager

## 2012-09-26 LAB — CBC
MCH: 28.4 pg (ref 26.0–34.0)
MCHC: 35.7 g/dL (ref 30.0–36.0)
MCV: 79.6 fL (ref 78.0–100.0)
Platelets: 151 10*3/uL (ref 150–400)
RBC: 3.13 MIL/uL — ABNORMAL LOW (ref 3.87–5.11)
RDW: 19.9 % — ABNORMAL HIGH (ref 11.5–15.5)

## 2012-09-26 LAB — CORTISOL: Cortisol, Plasma: 22.4 ug/dL

## 2012-09-26 LAB — GLUCOSE, CAPILLARY: Glucose-Capillary: 154 mg/dL — ABNORMAL HIGH (ref 70–99)

## 2012-09-26 LAB — MAGNESIUM: Magnesium: 1.8 mg/dL (ref 1.5–2.5)

## 2012-09-26 LAB — NA AND K (SODIUM & POTASSIUM), RAND UR
Potassium Urine: 15 mEq/L
Sodium, Ur: 156 mEq/L

## 2012-09-26 LAB — BASIC METABOLIC PANEL
BUN: 18 mg/dL (ref 6–23)
CO2: 19 mEq/L (ref 19–32)
Calcium: 8.5 mg/dL (ref 8.4–10.5)
GFR calc non Af Amer: >90 mL/min (ref >90)
Glucose, Bld: 165 mg/dL — ABNORMAL HIGH (ref 70–99)
Potassium: 3.3 mEq/L — ABNORMAL LOW (ref 3.5–5.1)
Sodium: 133 mEq/L — ABNORMAL LOW (ref 135–145)

## 2012-09-26 LAB — VANCOMYCIN, TROUGH: Vancomycin Tr: 12.7 ug/mL (ref 10.0–20.0)

## 2012-09-26 LAB — CULTURE, RESPIRATORY W GRAM STAIN: Culture: NO GROWTH

## 2012-09-26 LAB — CSF CULTURE W GRAM STAIN

## 2012-09-26 LAB — CULTURE, RESPIRATORY

## 2012-09-26 LAB — OSMOLALITY, URINE: Osmolality, Ur: 512 mOsm/kg (ref 390–1090)

## 2012-09-26 MED ORDER — POTASSIUM CHLORIDE 20 MEQ/15ML (10%) PO LIQD
20.0000 meq | ORAL | Status: AC
  Administered 2012-09-26 (×2): 20 meq
  Filled 2012-09-26 (×2): qty 15

## 2012-09-26 MED ORDER — VANCOMYCIN HCL 10 G IV SOLR
1250.0000 mg | Freq: Three times a day (TID) | INTRAVENOUS | Status: DC
  Administered 2012-09-26 – 2012-09-30 (×11): 1250 mg via INTRAVENOUS
  Filled 2012-09-26 (×12): qty 1250

## 2012-09-26 MED ORDER — POTASSIUM CHLORIDE 20 MEQ/15ML (10%) PO LIQD
40.0000 meq | Freq: Once | ORAL | Status: AC
  Administered 2012-09-26: 40 meq
  Filled 2012-09-26: qty 30

## 2012-09-26 MED ORDER — PLASMA-LYTE A IV SOLN
Freq: Once | INTRAVENOUS | Status: AC
  Administered 2012-09-26: 500 mL via INTRAVENOUS
  Filled 2012-09-26: qty 1000

## 2012-09-26 MED ORDER — POTASSIUM CHLORIDE 20 MEQ/15ML (10%) PO LIQD: 40.0000 meq | Freq: Three times a day (TID) | ORAL | Status: DC

## 2012-09-26 MED ORDER — HYDROCORTISONE SOD SUCCINATE 100 MG IJ SOLR
50.0000 mg | Freq: Four times a day (QID) | INTRAMUSCULAR | Status: DC
  Administered 2012-09-26 – 2012-09-30 (×16): 50 mg via INTRAVENOUS
  Filled 2012-09-26 (×20): qty 1

## 2012-09-26 NOTE — Progress Notes (Signed)
[facility] ADULT ICU REPLACEMENT PROTOCOL FOR AM LAB REPLACEMENT ONLY  The patient does apply for the [facility] Adult ICU Electrolyte Replacment Protocol based on the criteria listed below:   1. Is GFR >/= 40 ml/min? yes  Patient's GFR today is >90 2. Is urine output >/= 0.5 ml/kg/hr for the last 6 hours? yes Patient's UOP is 1.95 ml/kg/hr 3. Is BUN < 60 mg/dL? yes  Patient's BUN today is 18 4. Abnormal electrolyte(s): K+ 3.3 5. Ordered repletion with: see order 6. If a panic level lab has been reported, has the CCM MD in charge been notified? yes.   Physician:  Dr. Higinio Plan, Caedin Mogan A 09/26/2012 5:59 AM

## 2012-09-26 NOTE — Progress Notes (Signed)
ANTIBIOTIC CONSULT NOTE - FOLLOW UP  Pharmacy Consult for Vancomycin Indication: pneumonia  Allergies  Allergen Reactions  . Contrast Media [Iodinated Diagnostic Agents] Hives    Patient Measurements: Height: 5\' 1"  (154.9 cm) Weight: 131 lb 9.8 oz (59.7 kg) IBW/kg (Calculated) : 47.8 Adjusted Body Weight:   Vital Signs: Temp: 99.5 F (37.5 C) (09/26 1945) Temp src: Axillary (09/26 1945) BP: 150/75 mmHg (09/26 2015) Pulse Rate: 108 (09/26 2015) Intake/Output from previous day: 09/25 0701 - 09/26 0700 In: 8328.3 [I.V.:5721.3; NG/GT:1602; IV Piggyback:855] Out: 3435 [Urine:2725; Drains:185; Stool:525] Intake/Output from this shift: Total I/O In: 295.9 [I.V.:223.4; NG/GT:60; IV Piggyback:12.5] Out: 450 [Urine:450]  Labs:  Recent Labs  09/24/12 0407  09/24/12 1700 09/25/12 0400 09/26/12 0505  WBC 27.7*  < > 22.1* 20.5* 19.9*  HGB 9.9*  < > 9.1* 8.6* 8.9*  PLT 165  < > 135* 133* 151  CREATININE 0.61  --   --  0.48* 0.40*  < > = values in this interval not displayed. Estimated Creatinine Clearance: 90 ml/min (by C-G formula based on Cr of 0.4).  Recent Labs  09/25/12 0900 09/26/12 1700  VANCOTROUGH 8.6* 12.7     Microbiology: Recent Results (from the past 720 hour(s))  MRSA PCR SCREENING     Status: None   Collection Time    09/11/12  1:32 PM      Result Value Range Status   MRSA by PCR NEGATIVE  NEGATIVE Final   Comment:            The GeneXpert MRSA Assay (FDA     approved for NASAL specimens     only), is one component of a     comprehensive MRSA colonization     surveillance program. It is not     intended to diagnose MRSA     infection nor to guide or     monitor treatment for     MRSA infections.  CULTURE, BLOOD (ROUTINE X 2)     Status: None   Collection Time    09/12/12  3:48 AM      Result Value Range Status   Specimen Description BLOOD RIGHT ANTECUBITAL   Final   Special Requests BOTTLES DRAWN AEROBIC ONLY 5CC   Final   Culture  Setup  Time     Final   Value: 09/12/2012 11:37     Performed at [facility]   Culture     Final   Value: NO GROWTH 5 DAYS     Performed at [facility]   Report Status 09/18/2012 FINAL   Final  CULTURE, BLOOD (ROUTINE X 2)     Status: None   Collection Time    09/12/12  4:00 AM      Result Value Range Status   Specimen Description BLOOD RIGHT HAND   Final   Special Requests BOTTLES DRAWN AEROBIC ONLY 5CC   Final   Culture  Setup Time     Final   Value: 09/12/2012 11:35     Performed at [facility]   Culture     Final   Value: NO GROWTH 5 DAYS     Performed at [facility]   Report Status 09/18/2012 FINAL   Final  CULTURE, RESPIRATORY (NON-EXPECTORATED)     Status: None   Collection Time    09/12/12  8:15 AM      Result Value Range Status   Specimen Description TRACHEAL ASPIRATE   Final   Special  Requests NONE   Final   Gram Stain     Final   Value: NO WBC SEEN     NO SQUAMOUS EPITHELIAL CELLS SEEN     RARE GRAM NEGATIVE COCCI     Performed at [facility]   Culture     Final   Value: Non-Pathogenic Oropharyngeal-type Flora Isolated.     Performed at [facility]   Report Status 09/14/2012 FINAL   Final  CLOSTRIDIUM DIFFICILE BY PCR     Status: None   Collection Time    09/14/12 10:52 PM      Result Value Range Status   C difficile by pcr NEGATIVE  NEGATIVE Final  URINE CULTURE     Status: None   Collection Time    09/17/12  5:53 PM      Result Value Range Status   Specimen Description URINE, CATHETERIZED   Final   Special Requests Unasyn Normal   Final   Culture  Setup Time     Final   Value: 09/17/2012 18:49     Performed at [facility] Count     Final   Value: NO GROWTH     Performed at [facility]   Culture     Final   Value: NO GROWTH     Performed at [facility]   Report Status 09/19/2012 FINAL   Final  CSF CULTURE     Status: None   Collection Time    09/22/12  8:05 AM       Result Value Range Status   Specimen Description CSF   Final   Special Requests NONE   Final   Gram Stain     Final   Value: CYTOSPIN WBC PRESENT,BOTH PMN AND MONONUCLEAR     NO ORGANISMS SEEN     Performed at [facility]     Performed at [facility]   Culture     Final   Value: NO GROWTH 3 DAYS     Performed at [facility]   Report Status 09/26/2012 FINAL   Final  GRAM STAIN     Status: None   Collection Time    09/22/12  8:05 AM      Result Value Range Status   Specimen Description CSF   Final   Special Requests 2.5CC   Final   Gram Stain     Final   Value: CYTOSPIN SLIDE:     WBC PRESENT,BOTH PMN AND MONONUCLEAR     NO ORGANISMS SEEN   Report Status 09/22/2012 FINAL   Final  CULTURE, BLOOD (ROUTINE X 2)     Status: None   Collection Time    09/22/12  5:28 PM      Result Value Range Status   Specimen Description BLOOD LEFT HAND   Final   Special Requests BOTTLES DRAWN AEROBIC ONLY 8CC   Final   Culture  Setup Time     Final   Value: 09/22/2012 21:57     Performed at [facility]   Culture     Final   Value:        BLOOD CULTURE RECEIVED NO GROWTH TO DATE CULTURE WILL BE HELD FOR 5 DAYS BEFORE ISSUING A FINAL NEGATIVE REPORT     Performed at [facility]   Report Status PENDING   Incomplete  URINE CULTURE     Status: None   Collection Time  09/22/12  6:17 PM      Result Value Range Status   Specimen Description URINE, CATHETERIZED   Final   Special Requests NONE   Final   Culture  Setup Time     Final   Value: 09/22/2012 20:40     Performed at [facility] Count     Final   Value: >=100,000 COLONIES/ML     Performed at [facility]   Culture     Final   Value: ESCHERICHIA COLI     Performed at [facility]   Report Status 09/24/2012 FINAL   Final   Organism ID, Bacteria ESCHERICHIA COLI   Final  CULTURE, BLOOD (ROUTINE X 2)     Status: None   Collection Time    09/22/12  6:26 PM       Result Value Range Status   Specimen Description BLOOD LEFT ARM   Final   Special Requests BOTTLES DRAWN AEROBIC ONLY 10CC   Final   Culture  Setup Time     Final   Value: 09/22/2012 21:57     Performed at [facility]   Culture     Final   Value:        BLOOD CULTURE RECEIVED NO GROWTH TO DATE CULTURE WILL BE HELD FOR 5 DAYS BEFORE ISSUING A FINAL NEGATIVE REPORT     Performed at [facility]   Report Status PENDING   Incomplete  CULTURE, RESPIRATORY (NON-EXPECTORATED)     Status: None   Collection Time    09/24/12  2:32 PM      Result Value Range Status   Specimen Description TRACHEAL ASPIRATE   Final   Special Requests NONE   Final   Gram Stain     Final   Value: FEW WBC PRESENT,BOTH PMN AND MONONUCLEAR     NO SQUAMOUS EPITHELIAL CELLS SEEN     NO ORGANISMS SEEN     Performed at [facility]   Culture     Final   Value: NO GROWTH 2 DAYS     Performed at [facility]   Report Status 09/26/2012 FINAL   Final  CLOSTRIDIUM DIFFICILE BY PCR     Status: None   Collection Time    09/25/12  9:00 AM      Result Value Range Status   C difficile by pcr NEGATIVE  NEGATIVE Final    Anti-infectives   Start     Dose/Rate Route Frequency Ordered Stop   09/25/12 1800  vancomycin (VANCOCIN) IVPB 1000 mg/200 mL premix     1,000 mg 200 mL/hr over 60 Minutes Intravenous Every 8 hours 09/25/12 1319     09/23/12 0200  vancomycin (VANCOCIN) 500 mg in sodium chloride 0.9 % 100 mL IVPB  Status:  Discontinued     500 mg 100 mL/hr over 60 Minutes Intravenous Every 8 hours 09/22/12 1743 09/25/12 1319   09/22/12 1800  piperacillin-tazobactam (ZOSYN) IVPB 3.375 g     3.375 g 12.5 mL/hr over 240 Minutes Intravenous Every 8 hours 09/22/12 1744     09/22/12 1745  vancomycin (VANCOCIN) IVPB 750 mg/150 ml premix     750 mg 150 mL/hr over 60 Minutes Intravenous  Once 09/22/12 1743 09/22/12 1907   09/12/12 1053  bacitracin 50,000 Units in sodium chloride irrigation 0.9 %  500 mL irrigation  Status:  Discontinued       As needed 09/12/12 1054 09/12/12 1212  09/12/12 0800  Ampicillin-Sulbactam (UNASYN) 3 g in sodium chloride 0.9 % 100 mL IVPB  Status:  Discontinued     3 g 100 mL/hr over 60 Minutes Intravenous Every 8 hours 09/12/12 0152 09/12/12 0746   09/12/12 0800  Ampicillin-Sulbactam (UNASYN) 3 g in sodium chloride 0.9 % 100 mL IVPB  Status:  Discontinued     3 g 100 mL/hr over 60 Minutes Intravenous 4 times per day 09/12/12 0746 09/21/12 1155   09/12/12 0200  Ampicillin-Sulbactam (UNASYN) 3 g in sodium chloride 0.9 % 100 mL IVPB     3 g 100 mL/hr over 60 Minutes Intravenous  Once 09/12/12 0152 09/12/12 0336   09/11/12 2308  ceFAZolin (ANCEF) 1-5 GM-% IVPB    Comments:  Aydelette, Jamie   : cabinet override      09/11/12 2308 09/12/12 1114   09/11/12 1945  bacitracin 50,000 Units in sodium chloride irrigation 0.9 % 500 mL irrigation  Status:  Discontinued       As needed 09/11/12 2050 09/12/12 0030   09/11/12 1923  ceFAZolin (ANCEF) 2-3 GM-% IVPB SOLR    Comments:  Toney Sang   : cabinet override      09/11/12 1923 09/11/12 2310      Assessment: 26yof on Vancomycin and Zosyn Day 5 for suspected PNA. Vancomycin dose was increased on 9/25 due to subtherapeutic trough (8.6). Vancomycin trough was rechecked today on new regimen and reported as 12.7. This level was drawn only after 3rd dose and could potentially reach therapeutic range at steady state - however, will plan to increase dose since patient continues to spike fevers (Tmax 101.7), WBC remain elevated and renal function/UOP is good.  Goal of Therapy:  Vancomycin trough level 15-20 mcg/ml  Plan:  1. Increase Vancomycin dose to 1.25g IV q8h 2. Continue Zosyn 3.375g IV q8h 3. Monitor renal function, cultures, clinical course and recheck trough in 1-2 days  Cleon Dew [phone] 09/26/2012,8:41 PM

## 2012-09-26 NOTE — Progress Notes (Signed)
Transcranial Doppler  Date POD PCO2 HCT BP  MCA ACA PCA OPHT SIPH VERT Basilar  09/15/12 MS    131/78 Right  Left   59  69   -68  -74   33  20   14  14    -24  46   -26  *     *    09/17/12 MS     Right  Left   42  35   -22  -25   25  29   21  19   26   -40   -21  -25     -19    09/19/12 MS     Right  Left   132  35   -33  -40   12  -25    16  26   22   44   -22  -24     -24    09/22/12 MS      Right  Left   150  126   -92  -31   13  41   23  20   *  51   -22  -30   -24      09-24-12 SB      Right  Left   134  118   -58  -83   45  -33   26  37   24  34   -31  -25     -52    09-26-12 SB     Right  Left   114  52   -55  -86   21  22   21  18   29   32   -29  -23     -34         Right  Left                                        MCA = Middle Cerebral Artery      OPHT = Opthalmic Artery     BASILAR = Basilar Artery   ACA = Anterior Cerebral Artery     SIPH = Carotid Siphon PCA = Posterior Cerebral Artery   VERT = Verterbral Artery                   Normal MCA = 62+\-12 ACA = 50+\-12 PCA = 42+\-23   * Unable to insonate.   09/26/2012 11:33 AM Gertie Fey, RVT, RDCS, RDMS

## 2012-09-26 NOTE — Progress Notes (Signed)
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  LINES / TUBES: 9/12 ETT >>9/23 9/12 Lt Epworth >> 9/23 PEG tube >>> 9/23 Tracheostomy (jy) >>>  CULTURES: 9/12 respiratory culture >>neg 9/12 bld >> neg 9/14 C-diff pcr>>>neg 9/18 BCx2>>>neg 9/17 UC>>>neg Blood 9/22>>>NTD Urine 9/22>>>E. Coli sensitive to zosyn. 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 Following commands.  No events overnight.  Spoke with father extensively today, informed him that the patient will likely need trach but will need to speak with NS prior to proceeding further with either extubation or trach/peg to determine prognosis.  Father is ok with whatever recommendations we make. 9/20 - Waxing and waning mental status. More drowsy. Doing SBT but not extubation candidate 9/23- PEG Tube, Tracheostomy 9/23- Decreased LOC, hypotensive, increased leukocytosis, decreased Hgb. No new acute Hem or HCP on CT. Pt was given 3U PRBC and Levophed.   SUBJECTIVE/OVERNIGHT/INTERVAL HX:   No events overnight.  Vent overnight.  VITAL SIGNS: Temp:  [97.7 F (36.5 C)-102.2 F (39 C)] 97.7 F (36.5 C) (09/26 0800) Pulse Rate:  [93-119] 101 (09/26 0900) Resp:  [18-45] 22 (09/26 0900) BP: (117-159)/(45-78) 133/63 mmHg (09/26 0900) SpO2:  [97 %-100 %] 100 % (09/26 0900) FiO2 (%):   [30 %-35 %] 35 % (09/26 0823) Weight:  [59.7 kg (131 lb 9.8 oz)] 59.7 kg (131 lb 9.8 oz) (09/26 0500)  VENTILATOR SETTINGS: Vent Mode:  [-] PRVC FiO2 (%):  [30 %-35 %] 35 % Set Rate:  [18 bmp] 18 bmp Vt Set:  [400 mL] 400 mL PEEP:  [5 cmH20] 5 cmH20 Plateau Pressure:  [13 cmH20] 13 cmH20  INTAKE / OUTPUT: Intake/Output     09/25 0701 - 09/26 0700 09/26 0701 - 09/27 0700   I.V. (mL/kg) 5721.3 (95.8) 493.8 (8.3)   Blood     Other 150 100   NG/GT 1602 120   IV Piggyback 855    Total Intake(mL/kg) 8328.3 (139.5) 713.8 (12)   Urine (mL/kg/hr) 2725 (1.9) 200 (1.2)   Drains 185 (0.1) 16 (0.1)   Stool 525 (0.4)    Total Output 3435 216   Net +4893.3 +497.8         PHYSICAL EXAMINATION: Gen: young female, NAD on vent  HEENT: scalp dressing in place, drain in place, ETT in place PULM: resps even non labored on TC CV: s1 s2 RRR, loud diastolic/systolic murmur, no JVD AB: BS+, soft, nontender, no hsm, PEG tube in place Ext: warm, no edema, no clubbing, no cyanosis Derm: no rash or skin breakdown Neuro: eyes open, no response to verbal command, occ follow commands  LABS: PULMONARY  Recent Labs Lab 09/23/12 0354 09/23/12 1934 09/24/12 1420  PHART 7.460* 7.509* 7.419  PCO2ART 31.8* 27.3* 28.6*  PO2ART 32.0* 134.0* 132.0*  HCO3 22.4 21.4 18.2*  TCO2 23 22.2 19.0  O2SAT 63.0 99.1 99.4  CBC  Recent Labs Lab 09/24/12 1700 09/25/12 0400 09/26/12 0505  HGB 9.1* 8.6* 8.9*  HCT 25.5* 24.0* 24.9*  WBC 22.1* 20.5* 19.9*  PLT 135* 133* 151   CHEMISTRY  Recent Labs Lab 09/20/12 0214  09/21/12 0145  09/23/12 0455 09/23/12 2025 09/24/12 0407 09/25/12 0400 09/26/12 0505  NA 152*  < > 147*  < > 138 140 136 136 133*  K 3.6  --  3.2*  < > 4.1 3.8 3.8 3.2* 3.3*  CL 117*  --  113*  < > 105 106 105 107 102  CO2 27  --  24  < > 22 21 19  18* 19  GLUCOSE 125*  --  114*  < > 110* 122* 202* 165* 165*  BUN 33*  --  32*  < > 31* 32* 31* 25* 18  CREATININE 0.52  --  0.55  < >  0.54 0.56 0.61 0.48* 0.40*  CALCIUM 9.2  --  9.5  < > 9.5 8.2* 8.7 8.2* 8.5  MG 2.3  --  2.1  --  2.1  --  2.1  --  1.8  PHOS 4.2  --  4.1  --  4.6  --  4.1  --  2.6  < > = values in this interval not displayed. Estimated Creatinine Clearance: 90 ml/min (by C-G formula based on Cr of 0.4).  ENDOCRINE CBG (last 3)   Recent Labs  09/26/12 0026 09/26/12 0431 09/26/12 0744  GLUCAP 128* 141* 154*    IMAGING x48h  Dg Chest Port 1 View  09/25/2012   CLINICAL DATA:  Evaluate infiltrates.  EXAM: PORTABLE CHEST - 1 VIEW  COMPARISON:  09/24/2012.  FINDINGS: Tracheostomy tube tip midline.  Left central line tip in the proximal superior vena cava level.  Asymmetric airspace disease greater on the left may represent mild pulmonary vascular congestion/ pulmonary edema. Infectious infiltrate not excluded in proper clinical setting.  Elevated right hemidiaphragm with right lung base atelectasis versus infiltrate.  No gross pneumothorax.  IMPRESSION: Asymmetric airspace disease greater on the left may represent mild pulmonary vascular congestion/ pulmonary edema. Infectious infiltrate not excluded in proper clinical setting.  Elevated right hemidiaphragm with right lung base atelectasis versus infiltrate.   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 - HHH therapy, levophed for episode of spasm yesterday - CVP 14. - TCD per NS. - Work up hyponatremia.  PULMONARY A:  Post op respiratory failure > s/p trach 9/23, on vent FiO2 30%, O2 sat 100% CAP vs aspiration pneumonia (favor aspiration) - 9/24 CXR, increased opacificationRLL Asthma - without exacerbation P:   - RLL infiltrate with elevation in WBC. - See ID section. - Maintain on TC as tolerated.  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 HTN - Echo with grade 2 diastolic dysfunction, thickening and EF of 60-65%. 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 - Levophed for BP support as a part of HHH therapy. - MAP goals 85-105. - CVP of 12, will continue to follow. - Check cortisol level and start stress dose steroids.  RENAL A:   Hyponatremia, mild in setting brain edema.  ?CSW. P:   -  F/u chem in am  - Avoid free water, brain edema - NO lasix with HHH - Continue IVF. - Check urine Na, K and osom.  GASTROINTESTINAL A:   Dysphagia - in setting of AMS / Resp fx No active bleeding noted P:   - Tf as tolerated. - Pantoprazole  HEMATOLOGIC A:   Anemia, s/p 3U PRBC 9/24 > good response P:  - Monitor h/h - Transfusion threshold < 7gm/dl - SCD  INFECTIOUS A:   Aspiration pneumonia.  9/24 Febrile, chest Xray shows worsening of RLL opacification  UTI P:   - Recultured 9/22 as above. - CSF culture negative.   - Vanc/zosyn per pharmacy. - pCXR in am to re eval infiltrate rt. - Changed foley.  ENDOCRINE A:   Single episode of hyperglycemia, may be transient due to steroids  P:   - Monitor glucose.  Will check Na, K and osmolality of urine, continue IVF hydration, replace K, keep on TC as tolerated.  The patient is critically ill with multiple organ systems failure and requires high complexity decision making for assessment and support, frequent evaluation and titration of therapies, application of advanced monitoring technologies and extensive interpretation of multiple databases.   CC time 35 minutes.  Alyson Reedy, M.D. [facility] Pulmonary/Critical Care Medicine. Pager: [phone]. After hours pager: [phone].

## 2012-09-26 NOTE — Progress Notes (Signed)
14 Days Post-Op  Subjective: *on HTC  Objective: Vital signs in last 24 hours: Temp:  [97.7 F (36.5 C)-102.2 F (39 C)] 97.7 F (36.5 C) (09/26 0800) Pulse Rate:  [92-119] 107 (09/26 0800) Resp:  [18-45] 25 (09/26 0800) BP: (109-159)/(42-78) 140/72 mmHg (09/26 0800) SpO2:  [97 %-100 %] 100 % (09/26 0800) FiO2 (%):  [30 %-35 %] 30 % (09/26 0800) Weight:  [59.7 kg (131 lb 9.8 oz)] 59.7 kg (131 lb 9.8 oz) (09/26 0500) Last BM Date: 09/25/12  Intake/Output from previous day: 09/25 0701 - 09/26 0700 In: 8328.3 [I.V.:5721.3; NG/GT:1602; IV Piggyback:855] Out: 3435 [Urine:2725; Drains:185; Stool:525] Intake/Output this shift: Total I/O In: 406.9 [I.V.:246.9; Other:100; NG/GT:60] Out: 209 [Urine:200; Drains:9]  PEG site CDI, abdomen soft  Lab Results:   Recent Labs  09/25/12 0400 09/26/12 0505  WBC 20.5* 19.9*  HGB 8.6* 8.9*  HCT 24.0* 24.9*  PLT 133* 151   BMET  Recent Labs  09/25/12 0400 09/26/12 0505  NA 136 133*  K 3.2* 3.3*  CL 107 102  CO2 18* 19  GLUCOSE 165* 165*  BUN 25* 18  CREATININE 0.48* 0.40*  CALCIUM 8.2* 8.5   PT/INR No results found for this basename: LABPROT, INR,  in the last 72 hours ABG  Recent Labs  09/23/12 1934 09/24/12 1420  PHART 7.509* 7.419  HCO3 21.4 18.2*    Studies/Results: Dg Chest Port 1 View  09/25/2012   CLINICAL DATA:  Evaluate infiltrates.  EXAM: PORTABLE CHEST - 1 VIEW  COMPARISON:  09/24/2012.  FINDINGS: Tracheostomy tube tip midline.  Left central line tip in the proximal superior vena cava level.  Asymmetric airspace disease greater on the left may represent mild pulmonary vascular congestion/ pulmonary edema. Infectious infiltrate not excluded in proper clinical setting.  Elevated right hemidiaphragm with right lung base atelectasis versus infiltrate.  No gross pneumothorax.  IMPRESSION: Asymmetric airspace disease greater on the left may represent mild pulmonary vascular congestion/ pulmonary edema. Infectious  infiltrate not excluded in proper clinical setting.  Elevated right hemidiaphragm with right lung base atelectasis versus infiltrate.   Electronically Signed   By: Bridgett Larsson   On: 09/25/2012 07:23    Anti-infectives: Anti-infectives   Start     Dose/Rate Route Frequency Ordered Stop   09/25/12 1800  vancomycin (VANCOCIN) IVPB 1000 mg/200 mL premix     1,000 mg 200 mL/hr over 60 Minutes Intravenous Every 8 hours 09/25/12 1319     09/23/12 0200  vancomycin (VANCOCIN) 500 mg in sodium chloride 0.9 % 100 mL IVPB  Status:  Discontinued     500 mg 100 mL/hr over 60 Minutes Intravenous Every 8 hours 09/22/12 1743 09/25/12 1319   09/22/12 1800  piperacillin-tazobactam (ZOSYN) IVPB 3.375 g     3.375 g 12.5 mL/hr over 240 Minutes Intravenous Every 8 hours 09/22/12 1744     09/22/12 1745  vancomycin (VANCOCIN) IVPB 750 mg/150 ml premix     750 mg 150 mL/hr over 60 Minutes Intravenous  Once 09/22/12 1743 09/22/12 1907   09/12/12 1053  bacitracin 50,000 Units in sodium chloride irrigation 0.9 % 500 mL irrigation  Status:  Discontinued       As needed 09/12/12 1054 09/12/12 1212   09/12/12 0800  Ampicillin-Sulbactam (UNASYN) 3 g in sodium chloride 0.9 % 100 mL IVPB  Status:  Discontinued     3 g 100 mL/hr over 60 Minutes Intravenous Every 8 hours 09/12/12 0152 09/12/12 0746   09/12/12 0800  Ampicillin-Sulbactam (UNASYN) 3  g in sodium chloride 0.9 % 100 mL IVPB  Status:  Discontinued     3 g 100 mL/hr over 60 Minutes Intravenous 4 times per day 09/12/12 0746 09/21/12 1155   09/12/12 0200  Ampicillin-Sulbactam (UNASYN) 3 g in sodium chloride 0.9 % 100 mL IVPB     3 g 100 mL/hr over 60 Minutes Intravenous  Once 09/12/12 0152 09/12/12 0336   09/11/12 2308  ceFAZolin (ANCEF) 1-5 GM-% IVPB    Comments:  Aydelette, Jamie   : cabinet override      09/11/12 2308 09/12/12 1114   09/11/12 1945  bacitracin 50,000 Units in sodium chloride irrigation 0.9 % 500 mL irrigation  Status:  Discontinued       As  needed 09/11/12 2050 09/12/12 0030   09/11/12 1923  ceFAZolin (ANCEF) 2-3 GM-% IVPB SOLR    Comments:  Toney Sang   : cabinet override      09/11/12 1923 09/11/12 2310      Assessment/Plan: s/p Procedure(s): Right decompressive craniectomy and placement of boneflap in right lower quadrant (Right) S/P PEG - stable  LOS: 15 days    Sally Nelson 09/26/2012

## 2012-09-26 NOTE — Progress Notes (Signed)
Patient ID: Sally Nelson, female   DOB: 17-Jun-1985, 27 y.o.   MRN: [id number] Situation unchanged. , spontaneous movement of RUE. Open eyes do not track. Trach site clean. Continue with CCM help.

## 2012-09-27 LAB — TYPE AND SCREEN
ABO/RH(D): A POS
Antibody Screen: NEGATIVE
Unit division: 0
Unit division: 0

## 2012-09-27 LAB — CBC
MCH: 27.7 pg (ref 26.0–34.0)
MCHC: 34.3 g/dL (ref 30.0–36.0)
MCV: 80.7 fL (ref 78.0–100.0)
Platelets: 182 10*3/uL (ref 150–400)
RBC: 3.36 MIL/uL — ABNORMAL LOW (ref 3.87–5.11)
RDW: 20.6 % — ABNORMAL HIGH (ref 11.5–15.5)

## 2012-09-27 LAB — BASIC METABOLIC PANEL
CO2: 22 mEq/L (ref 19–32)
Calcium: 8.9 mg/dL (ref 8.4–10.5)
Creatinine, Ser: 0.36 mg/dL — ABNORMAL LOW (ref 0.50–1.10)
GFR calc Af Amer: >90 mL/min (ref >90)
GFR calc non Af Amer: >90 mL/min (ref >90)
Glucose, Bld: 169 mg/dL — ABNORMAL HIGH (ref 70–99)
Sodium: 133 mEq/L — ABNORMAL LOW (ref 135–145)

## 2012-09-27 LAB — GLUCOSE, CAPILLARY
Glucose-Capillary: 127 mg/dL — ABNORMAL HIGH (ref 70–99)
Glucose-Capillary: 130 mg/dL — ABNORMAL HIGH (ref 70–99)
Glucose-Capillary: 147 mg/dL — ABNORMAL HIGH (ref 70–99)
Glucose-Capillary: 148 mg/dL — ABNORMAL HIGH (ref 70–99)
Glucose-Capillary: 168 mg/dL — ABNORMAL HIGH (ref 70–99)
Glucose-Capillary: 175 mg/dL — ABNORMAL HIGH (ref 70–99)

## 2012-09-27 LAB — MAGNESIUM: Magnesium: 1.9 mg/dL (ref 1.5–2.5)

## 2012-09-27 MED ORDER — POTASSIUM CHLORIDE 20 MEQ/15ML (10%) PO LIQD
40.0000 meq | Freq: Three times a day (TID) | ORAL | Status: AC
  Administered 2012-09-27 (×2): 40 meq
  Filled 2012-09-27 (×2): qty 30

## 2012-09-27 MED ORDER — LEVETIRACETAM 100 MG/ML PO SOLN
500.0000 mg | Freq: Two times a day (BID) | ORAL | Status: DC
  Administered 2012-09-28 – 2012-10-25 (×54): 500 mg
  Filled 2012-09-27 (×59): qty 5

## 2012-09-27 MED ORDER — FLUDROCORTISONE 0.1 MG/ML ORAL SUSPENSION: 0.1000 mg | Freq: Every day | ORAL | Status: DC

## 2012-09-27 MED ORDER — ACETAMINOPHEN 160 MG/5ML PO SOLN
650.0000 mg | Freq: Four times a day (QID) | ORAL | Status: DC | PRN
  Administered 2012-09-27 – 2012-10-02 (×2): 650 mg via ORAL
  Filled 2012-09-27 (×3): qty 20.3

## 2012-09-27 MED ORDER — POLYVINYL ALCOHOL 1.4 % OP SOLN
1.0000 [drp] | OPHTHALMIC | Status: DC | PRN
  Administered 2012-09-27 – 2012-10-15 (×4): 1 [drp] via OPHTHALMIC
  Filled 2012-09-27: qty 15

## 2012-09-27 MED ORDER — FLUDROCORTISONE ACETATE 0.1 MG PO TABS
0.1000 mg | ORAL_TABLET | Freq: Every day | ORAL | Status: DC
  Administered 2012-09-27 – 2012-10-06 (×10): 0.1 mg
  Filled 2012-09-27 (×11): qty 1

## 2012-09-27 NOTE — Progress Notes (Signed)
Patient ID: Sally Nelson, female   DOB: 23-Feb-1985, 27 y.o.   MRN: [id number] Stable, follows commands. Moves the right side. Map about 70 to 80.IVC working well. Staples to be removed

## 2012-09-27 NOTE — Progress Notes (Signed)
Patient ID: Sally Nelson, female   DOB: Jul 10, 1985, 27 y.o.   MRN: [id number] Stable. No changes

## 2012-09-27 NOTE — Progress Notes (Signed)
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  LINES / TUBES: 9/12 ETT >>9/23 9/12 Lt [place] >> 9/23 PEG tube >>> 9/23 Tracheostomy (jy) >>>  CULTURES: 9/12 respiratory culture >>neg 9/12 bld >> neg 9/14 C-diff pcr>>>neg 9/18 BCx2>>>neg 9/17 UC>>>neg Blood 9/22>>>NTD Urine 9/22>>>E. Coli sensitive to zosyn. 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 Following commands.  No events overnight.  Spoke with father extensively today, informed him that the patient will likely need trach but will need to speak with NS prior to proceeding further with either extubation or trach/peg to determine prognosis.  Father is ok with whatever recommendations we make. 9/20 - Waxing and waning mental status. More drowsy. Doing SBT but not extubation candidate 9/23- PEG Tube, Tracheostomy 9/23- Decreased LOC, hypotensive, increased leukocytosis, decreased Hgb. No new acute Hem or HCP on CT. Pt was given 3U PRBC and Levophed.   SUBJECTIVE/OVERNIGHT/INTERVAL HX:   No events overnight.  Vent overnight.  VITAL SIGNS: Temp:  [97.7 F (36.5 C)-101.7 F (38.7 C)] 99.6 F (37.6 C) (09/27 0330) Pulse Rate:  [91-125] 91 (09/27 0730) Resp:  [16-47] 21 (09/27 0730) BP: (128-162)/(60-91) 149/70 mmHg (09/27 0730) SpO2:  [97 %-100 %] 100 % (09/27 0730) FiO2 (%):   [30 %-35 %] 35 % (09/27 0418) Weight:  [60.5 kg (133 lb 6.1 oz)] 60.5 kg (133 lb 6.1 oz) (09/27 0500)  VENTILATOR SETTINGS: Vent Mode:  [-]  FiO2 (%):  [30 %-35 %] 35 %  INTAKE / OUTPUT: Intake/Output     09/26 0701 - 09/27 0700 09/27 0701 - 09/28 0700   I.V. (mL/kg) 5656 (93.5)    Other 475    NG/GT 1440    IV Piggyback 905    Total Intake(mL/kg) 8476 (140.1)    Urine (mL/kg/hr) 4820 (3.3)    Drains 85 (0.1)    Stool 800 (0.6)    Total Output 5705     Net +2771           PHYSICAL EXAMINATION: Gen: young female, NAD on vent  HEENT: scalp dressing in place, drain in place, ETT in place PULM: resps even non labored on TC CV: s1 s2 RRR, loud diastolic/systolic murmur, no JVD AB: BS+, soft, nontender, no hsm, PEG tube in place Ext: warm, no edema, no clubbing, no cyanosis Derm: no rash or skin breakdown Neuro: eyes open, no response to verbal command, occ follow commands  LABS: PULMONARY  Recent Labs Lab 09/23/12 0354 09/23/12 1934 09/24/12 1420  PHART 7.460* 7.509* 7.419  PCO2ART 31.8* 27.3* 28.6*  PO2ART 32.0* 134.0* 132.0*  HCO3 22.4 21.4 18.2*  TCO2 23 22.2 19.0  O2SAT 63.0 99.1 99.4   CBC  Recent Labs Lab 09/25/12 0400 09/26/12 0505 09/27/12 0500  HGB 8.6* 8.9* 9.3*  HCT 24.0* 24.9* 27.1*  WBC 20.5* 19.9* 13.6*  PLT 133* 151 182   CHEMISTRY  Recent Labs Lab 09/21/12 0145  09/23/12 0455 09/23/12 2025 09/24/12 0407 09/25/12 0400 09/26/12 0505 09/27/12 0500  NA 147*  < > 138 140 136 136 133* 133*  K 3.2*  < > 4.1 3.8 3.8 3.2* 3.3* 3.5  CL 113*  < > 105 106 105 107 102 100  CO2 24  < > 22 21 19  18* 19 22  GLUCOSE 114*  < > 110* 122* 202* 165* 165* 169*  BUN 32*  < > 31* 32* 31* 25* 18 13  CREATININE 0.55  < > 0.54 0.56 0.61 0.48* 0.40* 0.36*  CALCIUM 9.5  < > 9.5 8.2* 8.7 8.2* 8.5 8.9  MG 2.1  --  2.1  --  2.1  --  1.8 1.9  PHOS 4.1  --  4.6  --  4.1  --  2.6 3.1  < > = values in this interval not displayed. Estimated Creatinine Clearance:  90.6 ml/min (by C-G formula based on Cr of 0.36).  ENDOCRINE CBG (last 3)   Recent Labs  09/26/12 1956 09/26/12 2351 09/27/12 0412  GLUCAP 172* 148* 168*    IMAGING x48h  No results found. 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 - HHH therapy, levophed for episode of spasm yesterday - CVP 14. - TCD per NS. - Work up hyponatremia.  PULMONARY A:  Post op respiratory failure > s/p trach 9/23, on vent FiO2 30%, O2 sat 100% CAP vs aspiration pneumonia (favor aspiration) - 9/24 CXR, increased opacificationRLL Asthma - without exacerbation P:   - RLL infiltrate with elevation in WBC. - See ID section. - Maintain on TC as tolerated.  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 HTN - Echo with grade 2 diastolic dysfunction, thickening and EF of 60-65%. 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 - Levophed for BP support as a part of HHH therapy, down to 25 mcg at this point. - MAP goals 85-105. - CVP of 12, will continue to follow. - Cortisol level 22.4 but with use of hydrocortisone patient's BP improved, will maintain stress dose steroids.  RENAL A:   Hyponatremia, mild in setting brain edema.  CSW more likely given that urine Na is equal to Na intake (156 to 154 in saline).  Sodium stabilized with matching intake and input.  CSW more likely given that Na is not rising even with high Na intake. P:   - F/u chem in am  - Avoid free water, brain edema - NO lasix with HHH - Continue IVF. - Start fludrocortisone.  GASTROINTESTINAL A:   Dysphagia - in setting of AMS / Resp fx No active bleeding noted P:   - Tf as tolerated. - Pantoprazole  HEMATOLOGIC A:   Anemia, s/p 3U PRBC 9/24 > good response P:  - Monitor h/h - Transfusion threshold <  7gm/dl - SCD  INFECTIOUS A:   Aspiration pneumonia.  9/24 Febrile, chest Xray shows worsening of RLL opacification  UTI P:   - Recultured 9/22 as above. - CSF culture negative.   - Vanc/zosyn per pharmacy. - pCXR in am to re eval infiltrate rt. - Changed foley.  ENDOCRINE A:   Single episode of hyperglycemia, may be transient due to steroids  P:   -  Monitor glucose.  Will check Na, K and osmolality of urine, continue IVF hydration, replace K, keep on TC as tolerated.  The patient is critically ill with multiple organ systems failure and requires high complexity decision making for assessment and support, frequent evaluation and titration of therapies, application of advanced monitoring technologies and extensive interpretation of multiple databases.   CC time 35 minutes.  Alyson Reedy, M.D. [facility] Pulmonary/Critical Care Medicine. Pager: [phone]. After hours pager: [phone].

## 2012-09-28 LAB — CBC
HCT: 28.2 % — ABNORMAL LOW (ref 36.0–46.0)
MCHC: 34.8 g/dL (ref 30.0–36.0)
Platelets: 153 10*3/uL (ref 150–400)
RDW: 21 % — ABNORMAL HIGH (ref 11.5–15.5)
WBC: 12.1 10*3/uL — ABNORMAL HIGH (ref 4.0–10.5)

## 2012-09-28 LAB — CULTURE, BLOOD (ROUTINE X 2): Culture: NO GROWTH

## 2012-09-28 LAB — GLUCOSE, CAPILLARY
Glucose-Capillary: 122 mg/dL — ABNORMAL HIGH (ref 70–99)
Glucose-Capillary: 110 mg/dL — ABNORMAL HIGH (ref 70–99)
Glucose-Capillary: 121 mg/dL — ABNORMAL HIGH (ref 70–99)
Glucose-Capillary: 122 mg/dL — ABNORMAL HIGH (ref 70–99)

## 2012-09-28 LAB — BASIC METABOLIC PANEL
BUN: 11 mg/dL (ref 6–23)
Chloride: 103 mEq/L (ref 96–112)
GFR calc Af Amer: >90 mL/min (ref >90)
GFR calc non Af Amer: >90 mL/min (ref >90)
Glucose, Bld: 130 mg/dL — ABNORMAL HIGH (ref 70–99)
Potassium: 3.2 mEq/L — ABNORMAL LOW (ref 3.5–5.1)

## 2012-09-28 LAB — PHOSPHORUS: Phosphorus: 3 mg/dL (ref 2.3–4.6)

## 2012-09-28 LAB — MAGNESIUM: Magnesium: 1.8 mg/dL (ref 1.5–2.5)

## 2012-09-28 MED ORDER — ALBUTEROL SULFATE (5 MG/ML) 0.5% IN NEBU
2.5000 mg | INHALATION_SOLUTION | Freq: Four times a day (QID) | RESPIRATORY_TRACT | Status: DC | PRN
  Administered 2012-09-28: 2.5 mg via RESPIRATORY_TRACT
  Filled 2012-09-28: qty 0.5

## 2012-09-28 MED ORDER — LOPERAMIDE HCL 1 MG/5ML PO LIQD
4.0000 mg | ORAL | Status: DC | PRN
  Administered 2012-09-28 (×2): 4 mg
  Filled 2012-09-28 (×2): qty 20

## 2012-09-28 MED ORDER — SODIUM PHOSPHATE 3 MMOLE/ML IV SOLN
20.0000 mmol | Freq: Once | INTRAVENOUS | Status: AC
  Administered 2012-09-28: 20 mmol via INTRAVENOUS
  Filled 2012-09-28: qty 6.67

## 2012-09-28 MED ORDER — POTASSIUM CHLORIDE 20 MEQ/15ML (10%) PO LIQD
40.0000 meq | Freq: Three times a day (TID) | ORAL | Status: AC
  Administered 2012-09-28 (×3): 40 meq
  Filled 2012-09-28 (×3): qty 30

## 2012-09-28 MED ORDER — POTASSIUM CHLORIDE 20 MEQ/15ML (10%) PO LIQD
30.0000 meq | ORAL | Status: AC
  Administered 2012-09-28 (×2): 30 meq
  Filled 2012-09-28 (×2): qty 22.5

## 2012-09-28 NOTE — Progress Notes (Signed)
[facility] ADULT ICU REPLACEMENT PROTOCOL FOR AM LAB REPLACEMENT ONLY  The patient does apply for the [facility] Adult ICU Electrolyte Replacment Protocol based on the criteria listed below:   1. Is GFR >/= 40 ml/min? yes  Patient's GFR today is >90 2. Is urine output >/= 0.5 ml/kg/hr for the last 6 hours? yes Patient's UOP is 3.8 ml/kg/hr 3. Is BUN < 60 mg/dL? yes  Patient's BUN today is 11 4. Abnormal electrolyte(s): K+3.2 Mag 1.8 5. Ordered repletion with: protocol 6. If a panic level lab has been reported, has the CCM MD in charge been notified? yes.   Physician:  Rhina Brackett, Renae Fickle Hilliard 09/28/2012 4:31 AM

## 2012-09-28 NOTE — Progress Notes (Signed)
Patient ID: Sally Nelson, female   DOB: Dec 08, 1985, 27 y.o.   MRN: [id number] Neuro stable. F/c with right side . Continue with increase of ivf.

## 2012-09-28 NOTE — Progress Notes (Signed)
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  LINES / TUBES: 9/12 ETT >>9/23 9/12 Lt  >> 9/23 PEG tube >>> 9/23 Tracheostomy (jy) >>>  CULTURES: 9/12 respiratory culture >>neg 9/12 bld >> neg 9/14 C-diff pcr>>>neg 9/18 BCx2>>>neg 9/17 UC>>>neg Blood 9/22>>>NTD Urine 9/22>>>E. Coli sensitive to zosyn. 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 Following commands.  No events overnight.  Spoke with father extensively today, informed him that the patient will likely need trach but will need to speak with NS prior to proceeding further with either extubation or trach/peg to determine prognosis.  Father is ok with whatever recommendations we make. 9/20 - Waxing and waning mental status. More drowsy. Doing SBT but not extubation candidate 9/23- PEG Tube, Tracheostomy 9/23- Decreased LOC, hypotensive, increased leukocytosis, decreased Hgb. No new acute Hem or HCP on CT. Pt was given 3U PRBC and Levophed.   SUBJECTIVE/OVERNIGHT/INTERVAL HX:   No events overnight.  Vent overnight.  VITAL SIGNS: Temp:  [97.9 F (36.6 C)-100 F (37.8 C)] 98 F (36.7 C) (09/28 0800) Pulse Rate:  [69-117] 69 (09/28 0807) Resp:  [0-38] 16 (09/28 0807) BP: (123-153)/(61-94) 141/84 mmHg (09/28 0807) SpO2:  [96 %-100 %] 100 % (09/28 0807) FiO2  (%):  [28 %-35 %] 28 % (09/28 0807) Weight:  [58.5 kg (128 lb 15.5 oz)] 58.5 kg (128 lb 15.5 oz) (09/28 0500)  VENTILATOR SETTINGS: Vent Mode:  [-]  FiO2 (%):  [28 %-35 %] 28 %  INTAKE / OUTPUT: Intake/Output     09/27 0701 - 09/28 0700 09/28 0701 - 09/29 0700   I.V. (mL/kg) 4708.3 (80.5) 53.3 (0.9)   Other     NG/GT 1570 110   IV Piggyback 1366.7 250   Total Intake(mL/kg) 7645 (130.7) 413.3 (7.1)   Urine (mL/kg/hr) 7385 (5.3) 200 (1.9)   Drains 67 (0) 9 (0.1)   Stool 625 (0.4)    Total Output 8077 209   Net -432 +204.3         PHYSICAL EXAMINATION: Gen: young female, NAD on vent  HEENT: scalp dressing in place, drain in place, ETT in place PULM: resps even non labored on TC CV: s1 s2 RRR, loud diastolic/systolic murmur, no JVD AB: BS+, soft, nontender, no hsm, PEG tube in place Ext: warm, no edema, no clubbing, no cyanosis Derm: no rash or skin breakdown Neuro: eyes open, no response to verbal command, occ follow commands  LABS: PULMONARY  Recent Labs Lab 09/23/12 0354 09/23/12 1934 09/24/12 1420  PHART 7.460* 7.509* 7.419  PCO2ART 31.8* 27.3* 28.6*  PO2ART 32.0* 134.0* 132.0*  HCO3 22.4 21.4 18.2*  TCO2 23 22.2 19.0  O2SAT 63.0 99.1 99.4   CBC  Recent Labs Lab 09/26/12 0505 09/27/12 0500 09/28/12 0256  HGB 8.9* 9.3* 9.8*  HCT 24.9* 27.1* 28.2*  WBC 19.9* 13.6* 12.1*  PLT 151 182 153  CHEMISTRY  Recent Labs Lab 09/23/12 0455  09/24/12 0407 09/25/12 0400 09/26/12 0505 09/27/12 0500 09/28/12 0256  NA 138  < > 136 136 133* 133* 139  K 4.1  < > 3.8 3.2* 3.3* 3.5 3.2*  CL 105  < > 105 107 102 100 103  CO2 22  < > 19 18* 19 22 27   GLUCOSE 110*  < > 202* 165* 165* 169* 130*  BUN 31*  < > 31* 25* 18 13 11   CREATININE 0.54  < > 0.61 0.48* 0.40* 0.36* 0.35*  CALCIUM 9.5  < > 8.7 8.2* 8.5 8.9 9.0  MG 2.1  --  2.1  --  1.8 1.9 1.8  PHOS 4.6  --  4.1  --  2.6 3.1 3.0  < > = values in this interval not displayed. Estimated Creatinine Clearance: 89.2  ml/min (by C-G formula based on Cr of 0.35).  ENDOCRINE CBG (last 3)   Recent Labs  09/28/12 0002 09/28/12 0253 09/28/12 0717  GLUCAP 100* 122* 122*    IMAGING x48h  No results found. 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 - Cont keppra. - HHH therapy per NS. - TCD per NS. - Work up hyponatremia. - Recommend discontinuation of IVF at this point since Na is improving with florinef.  PULMONARY A:  Post op respiratory failure > s/p trach 9/23, on vent FiO2 30%, O2 sat 100% CAP vs aspiration pneumonia (favor aspiration) - 9/24 CXR, increased opacificationRLL Asthma - without exacerbation P:   - RLL infiltrate with elevation in WBC. - See ID section. - Maintain on TC as tolerated.  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 HTN - Echo with grade 2 diastolic dysfunction, thickening and EF of 60-65%. 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 - D/C levophed. - MAP goals 85-105. - CVP of 12, will continue to follow. - Cortisol level 22.4, continue stress dose steroids.  RENAL A:   Hyponatremia, mild in setting brain edema.  CSW more likely given that urine Na is equal to Na intake (156 to 154 in saline).  Sodium stabilized with matching intake and input.  CSW more likely given that Na is not rising even with high Na intake. P:   - F/u chem in am  - Avoid free water, brain edema - NO lasix with HHH - Recommend d/c of IVF. - Continue fludrocortisone.  GASTROINTESTINAL A:   Dysphagia - in setting of AMS / Resp fx No active bleeding noted P:   - Tf as tolerated. - Pantoprazole - Imodium for diarrhea (C. Diff negative likely TF related).  HEMATOLOGIC A:   Anemia, s/p 3U PRBC 9/24 > good response P:  - Monitor  h/h - Transfusion threshold < 7gm/dl - SCD  INFECTIOUS A:   Aspiration pneumonia.  9/24 Febrile, chest Xray shows worsening of RLL opacification  UTI P:   - Recultured 9/22 as above. - CSF culture negative.   - Vanc/zosyn per pharmacy will d/c after 9/29 doses. - pCXR in am to re eval infiltrate rt. - Changed foley.  ENDOCRINE A:   Single episode of hyperglycemia, may be transient due to steroids  P:   - Monitor  glucose.  Na improving with fludrocortisone, BP improved with hydrocortisone stress dose, spoke with Dr. Jeral Fruit, patient will need a VP shunt and once in place will be ready for placement.  The patient is critically ill with multiple organ systems failure and requires high complexity decision making for assessment and support, frequent evaluation and titration of therapies, application of advanced monitoring technologies and extensive interpretation of multiple databases.   CC time 35 minutes.  Alyson Reedy, M.D. [facility] Pulmonary/Critical Care Medicine. Pager: [phone]. After hours pager: [phone].

## 2012-09-29 LAB — GLUCOSE, CAPILLARY
Glucose-Capillary: 125 mg/dL — ABNORMAL HIGH (ref 70–99)
Glucose-Capillary: 129 mg/dL — ABNORMAL HIGH (ref 70–99)

## 2012-09-29 LAB — BASIC METABOLIC PANEL
BUN: 13 mg/dL (ref 6–23)
CO2: 28 mEq/L (ref 19–32)
Calcium: 8.9 mg/dL (ref 8.4–10.5)
GFR calc non Af Amer: >90 mL/min (ref >90)
Glucose, Bld: 106 mg/dL — ABNORMAL HIGH (ref 70–99)
Potassium: 3.3 mEq/L — ABNORMAL LOW (ref 3.5–5.1)
Sodium: 137 mEq/L (ref 135–145)

## 2012-09-29 LAB — CBC
Hemoglobin: 9.7 g/dL — ABNORMAL LOW (ref 12.0–15.0)
MCH: 27.6 pg (ref 26.0–34.0)
MCHC: 34 g/dL (ref 30.0–36.0)
Platelets: 151 10*3/uL (ref 150–400)
RBC: 3.51 MIL/uL — ABNORMAL LOW (ref 3.87–5.11)

## 2012-09-29 LAB — PHOSPHORUS: Phosphorus: 3.7 mg/dL (ref 2.3–4.6)

## 2012-09-29 LAB — MAGNESIUM: Magnesium: 1.7 mg/dL (ref 1.5–2.5)

## 2012-09-29 MED ORDER — POTASSIUM CHLORIDE 20 MEQ/15ML (10%) PO LIQD
20.0000 meq | ORAL | Status: DC
  Administered 2012-09-29: 20 meq
  Filled 2012-09-29: qty 15

## 2012-09-29 MED ORDER — FLUCONAZOLE 40 MG/ML PO SUSR
200.0000 mg | Freq: Every day | ORAL | Status: AC
  Administered 2012-09-29 – 2012-10-01 (×3): 200 mg
  Filled 2012-09-29 (×3): qty 5

## 2012-09-29 MED ORDER — MAGNESIUM SULFATE 40 MG/ML IJ SOLN
2.0000 g | Freq: Once | INTRAMUSCULAR | Status: AC
  Administered 2012-09-29: 2 g via INTRAVENOUS
  Filled 2012-09-29: qty 50

## 2012-09-29 MED ORDER — POTASSIUM CHLORIDE 20 MEQ/15ML (10%) PO LIQD
40.0000 meq | Freq: Three times a day (TID) | ORAL | Status: AC
  Administered 2012-09-29 (×3): 40 meq
  Filled 2012-09-29 (×3): qty 30

## 2012-09-29 MED ORDER — OXYBUTYNIN CHLORIDE 5 MG/5ML PO SYRP
2.5000 mg | ORAL_SOLUTION | Freq: Two times a day (BID) | ORAL | Status: DC
  Administered 2012-09-29 – 2012-10-14 (×32): 2.5 mg
  Filled 2012-09-29 (×37): qty 2.5

## 2012-09-29 MED ORDER — LOPERAMIDE HCL 1 MG/5ML PO LIQD
4.0000 mg | Freq: Two times a day (BID) | ORAL | Status: DC
  Administered 2012-09-29 – 2012-10-11 (×20): 4 mg
  Filled 2012-09-29 (×33): qty 20

## 2012-09-29 NOTE — Progress Notes (Signed)
2030 entered pt room and noted a signigicant increase in output from the IVC drain. The drain had put out 31 ml. Assessed drain and saw it was set on 5 mm Hg. I put the drain back at ordered level of 15 mm Hg.   2235 IVC has had not output since drain was placed back at 15 mm Hg. Drain is tidaling and does not appear to be occluded.

## 2012-09-29 NOTE — Progress Notes (Signed)
Pt seen and examined. No issues overnight.  EXAM: Temp:  [98.1 F (36.7 C)-100.1 F (37.8 C)] 98.6 F (37 C) (09/29 0800) Pulse Rate:  [41-107] 78 (09/29 0900) Resp:  [12-24] 15 (09/29 0900) BP: (122-143)/(70-96) 132/86 mmHg (09/29 0900) SpO2:  [96 %-100 %] 100 % (09/29 0900) FiO2 (%):  [28 %] 28 % (09/29 0411) Weight:  [60.2 kg (132 lb 11.5 oz)] 60.2 kg (132 lb 11.5 oz) (09/29 0500) Intake/Output     09/28 0701 - 09/29 0700 09/29 0701 - 09/30 0700   I.V. (mL/kg) 4153.3 (69) 400 (6.6)   Other  110   NG/GT 1640 120   IV Piggyback 900 250   Total Intake(mL/kg) 6693.3 (111.2) 880 (14.6)   Urine (mL/kg/hr) 6295 (4.4) 110 (0.7)   Drains 97 (0.1) 6 (0)   Stool 500 (0.3)    Total Output 6892 116   Net -198.7 +764         EVD in place, 97cc x 24hrs open at Awake, alert Intubated, breathing over vent Follows commands briskly RUE/RLE. No spontaneous movement LLE/LUE Wound c/d/i  LABS: Lab Results  Component Value Date   CREATININE 0.34* 09/29/2012   BUN 13 09/29/2012   NA 137 09/29/2012   K 3.3* 09/29/2012   CL 100 09/29/2012   CO2 28 09/29/2012   Lab Results  Component Value Date   WBC 9.9 09/29/2012   HGB 9.7* 09/29/2012   HCT 28.5* 09/29/2012   MCV 81.2 09/29/2012   PLT 151 09/29/2012    IMAGING: No new imaging overnight  IMPRESSION: - 27 y.o. female SAH d# 18 s/p RMCA aneurysm clipping  - Neurologically stable, exiting vasospasm period  PLAN: - Increase EVD to - will attempt to wean EVD over next few days. - Decrease IVF to 100cc/hr - Stable on trach collar. Abx for aspiriation pneumonia vs VAP - BP stable off Levophed, neurologically stable with normotension - Cont TF

## 2012-09-29 NOTE — Progress Notes (Signed)
ANTIBIOTIC CONSULT NOTE - FOLLOW UP  Pharmacy Consult for Vanco/Zosyn Indication: Fevers  Allergies  Allergen Reactions  . Contrast Media [Iodinated Diagnostic Agents] Hives    Patient Measurements: Height: 5\' 1"  (154.9 cm) Weight: 132 lb 11.5 oz (60.2 kg) IBW/kg (Calculated) : 47.8 Adjusted Body Weight:    Vital Signs: Temp: 98.6 F (37 C) (09/29 0800) Temp src: Axillary (09/29 0800) BP: 132/86 mmHg (09/29 0900) Pulse Rate: 78 (09/29 0900) Intake/Output from previous day: 09/28 0701 - 09/29 0700 In: 1914.7 [I.V.:4153.3; NG/GT:1640; IV Piggyback:900] Out: 8295 [Urine:6295; Drains:97; Stool:500] Intake/Output from this shift: Total I/O In: 880 [I.V.:400; Other:110; NG/GT:120; IV Piggyback:250] Out: 116 [Urine:110; Drains:6]  Labs:  Recent Labs  09/27/12 0500 09/28/12 0256 09/29/12 0500  WBC 13.6* 12.1* 9.9  HGB 9.3* 9.8* 9.7*  PLT 182 153 151  CREATININE 0.36* 0.35* 0.34*   Estimated Creatinine Clearance: 90.4 ml/min (by C-G formula based on Cr of 0.34).  Recent Labs  09/26/12 1700  VANCOTROUGH 12.7     Microbiology: Recent Results (from the past 720 hour(s))  MRSA PCR SCREENING     Status: None   Collection Time    09/11/12  1:32 PM      Result Value Range Status   MRSA by PCR NEGATIVE  NEGATIVE Final   Comment:            The GeneXpert MRSA Assay (FDA     approved for NASAL specimens     only), is one component of a     comprehensive MRSA colonization     surveillance program. It is not     intended to diagnose MRSA     infection nor to guide or     monitor treatment for     MRSA infections.  CULTURE, BLOOD (ROUTINE X 2)     Status: None   Collection Time    09/12/12  3:48 AM      Result Value Range Status   Specimen Description BLOOD RIGHT ANTECUBITAL   Final   Special Requests BOTTLES DRAWN AEROBIC ONLY 5CC   Final   Culture  Setup Time     Final   Value: 09/12/2012 11:37     Performed at [facility]   Culture     Final   Value: NO GROWTH 5 DAYS     Performed at [facility]   Report Status 09/18/2012 FINAL   Final  CULTURE, BLOOD (ROUTINE X 2)     Status: None   Collection Time    09/12/12  4:00 AM      Result Value Range Status   Specimen Description BLOOD RIGHT HAND   Final   Special Requests BOTTLES DRAWN AEROBIC ONLY 5CC   Final   Culture  Setup Time     Final   Value: 09/12/2012 11:35     Performed at [facility]   Culture     Final   Value: NO GROWTH 5 DAYS     Performed at [facility]   Report Status 09/18/2012 FINAL   Final  CULTURE, RESPIRATORY (NON-EXPECTORATED)     Status: None   Collection Time    09/12/12  8:15 AM      Result Value Range Status   Specimen Description TRACHEAL ASPIRATE   Final   Special Requests NONE   Final   Gram Stain     Final   Value: NO WBC SEEN     NO SQUAMOUS EPITHELIAL CELLS SEEN  RARE GRAM NEGATIVE COCCI     Performed at [facility]   Culture     Final   Value: Non-Pathogenic Oropharyngeal-type Flora Isolated.     Performed at [facility]   Report Status 09/14/2012 FINAL   Final  CLOSTRIDIUM DIFFICILE BY PCR     Status: None   Collection Time    09/14/12 10:52 PM      Result Value Range Status   C difficile by pcr NEGATIVE  NEGATIVE Final  URINE CULTURE     Status: None   Collection Time    09/17/12  5:53 PM      Result Value Range Status   Specimen Description URINE, CATHETERIZED   Final   Special Requests Unasyn Normal   Final   Culture  Setup Time     Final   Value: 09/17/2012 18:49     Performed at [facility] Count     Final   Value: NO GROWTH     Performed at [facility]   Culture     Final   Value: NO GROWTH     Performed at [facility]   Report Status 09/19/2012 FINAL   Final  CSF CULTURE     Status: None   Collection Time    09/22/12  8:05 AM      Result Value Range Status   Specimen Description CSF   Final   Special Requests NONE   Final   Gram  Stain     Final   Value: CYTOSPIN WBC PRESENT,BOTH PMN AND MONONUCLEAR     NO ORGANISMS SEEN     Performed at [facility]     Performed at [facility]   Culture     Final   Value: NO GROWTH 3 DAYS     Performed at [facility]   Report Status 09/26/2012 FINAL   Final  GRAM STAIN     Status: None   Collection Time    09/22/12  8:05 AM      Result Value Range Status   Specimen Description CSF   Final   Special Requests 2.5CC   Final   Gram Stain     Final   Value: CYTOSPIN SLIDE:     WBC PRESENT,BOTH PMN AND MONONUCLEAR     NO ORGANISMS SEEN   Report Status 09/22/2012 FINAL   Final  CULTURE, BLOOD (ROUTINE X 2)     Status: None   Collection Time    09/22/12  5:28 PM      Result Value Range Status   Specimen Description BLOOD LEFT HAND   Final   Special Requests BOTTLES DRAWN AEROBIC ONLY 8CC   Final   Culture  Setup Time     Final   Value: 09/22/2012 21:57     Performed at [facility]   Culture     Final   Value: NO GROWTH 5 DAYS     Performed at [facility]   Report Status 09/28/2012 FINAL   Final  URINE CULTURE     Status: None   Collection Time    09/22/12  6:17 PM      Result Value Range Status   Specimen Description URINE, CATHETERIZED   Final   Special Requests NONE   Final   Culture  Setup Time     Final   Value: 09/22/2012 20:40     Performed at [facility]   Colony Count     Final   Value: >=100,000 COLONIES/ML     Performed at [facility]     Final   Value: ESCHERICHIA COLI     Performed at [facility]   Report Status 09/24/2012 FINAL   Final   Organism ID, Bacteria ESCHERICHIA COLI   Final  CULTURE, BLOOD (ROUTINE X 2)     Status: None   Collection Time    09/22/12  6:26 PM      Result Value Range Status   Specimen Description BLOOD LEFT ARM   Final   Special Requests BOTTLES DRAWN AEROBIC ONLY 10CC   Final   Culture  Setup Time     Final   Value: 09/22/2012 21:57      Performed at [facility]   Culture     Final   Value: NO GROWTH 5 DAYS     Performed at [facility]   Report Status 09/28/2012 FINAL   Final  CULTURE, RESPIRATORY (NON-EXPECTORATED)     Status: None   Collection Time    09/24/12  2:32 PM      Result Value Range Status   Specimen Description TRACHEAL ASPIRATE   Final   Special Requests NONE   Final   Gram Stain     Final   Value: FEW WBC PRESENT,BOTH PMN AND MONONUCLEAR     NO SQUAMOUS EPITHELIAL CELLS SEEN     NO ORGANISMS SEEN     Performed at [facility]   Culture     Final   Value: NO GROWTH 2 DAYS     Performed at [facility]   Report Status 09/26/2012 FINAL   Final  CLOSTRIDIUM DIFFICILE BY PCR     Status: None   Collection Time    09/25/12  9:00 AM      Result Value Range Status   C difficile by pcr NEGATIVE  NEGATIVE Final    Anti-infectives   Start     Dose/Rate Route Frequency Ordered Stop   09/29/12 1115  fluconazole (DIFLUCAN) 40 MG/ML suspension 200 mg     200 mg Per Tube Daily 09/29/12 1005 10/02/12 0959   09/27/12 0000  vancomycin (VANCOCIN) 1,250 mg in sodium chloride 0.9 % 250 mL IVPB     1,250 mg 166.7 mL/hr over 90 Minutes Intravenous Every 8 hours 09/26/12 2050     09/25/12 1800  vancomycin (VANCOCIN) IVPB 1000 mg/200 mL premix  Status:  Discontinued     1,000 mg 200 mL/hr over 60 Minutes Intravenous Every 8 hours 09/25/12 1319 09/26/12 2050   09/23/12 0200  vancomycin (VANCOCIN) 500 mg in sodium chloride 0.9 % 100 mL IVPB  Status:  Discontinued     500 mg 100 mL/hr over 60 Minutes Intravenous Every 8 hours 09/22/12 1743 09/25/12 1319   09/22/12 1800  piperacillin-tazobactam (ZOSYN) IVPB 3.375 g     3.375 g 12.5 mL/hr over 240 Minutes Intravenous Every 8 hours 09/22/12 1744     09/22/12 1745  vancomycin (VANCOCIN) IVPB 750 mg/150 ml premix     750 mg 150 mL/hr over 60 Minutes Intravenous  Once 09/22/12 1743 09/22/12 1907   09/12/12 1053  bacitracin 50,000 Units in  sodium chloride irrigation 0.9 % 500 mL irrigation  Status:  Discontinued       As needed 09/12/12 1054 09/12/12 1212   09/12/12 0800  Ampicillin-Sulbactam (UNASYN) 3 g in sodium chloride 0.9 %  100 mL IVPB  Status:  Discontinued     3 g 100 mL/hr over 60 Minutes Intravenous Every 8 hours 09/12/12 0152 09/12/12 0746   09/12/12 0800  Ampicillin-Sulbactam (UNASYN) 3 g in sodium chloride 0.9 % 100 mL IVPB  Status:  Discontinued     3 g 100 mL/hr over 60 Minutes Intravenous 4 times per day 09/12/12 0746 09/21/12 1155   09/12/12 0200  Ampicillin-Sulbactam (UNASYN) 3 g in sodium chloride 0.9 % 100 mL IVPB     3 g 100 mL/hr over 60 Minutes Intravenous  Once 09/12/12 0152 09/12/12 0336   09/11/12 2308  ceFAZolin (ANCEF) 1-5 GM-% IVPB    Comments:  Aydelette, Jamie   : cabinet override      09/11/12 2308 09/12/12 1114   09/11/12 1945  bacitracin 50,000 Units in sodium chloride irrigation 0.9 % 500 mL irrigation  Status:  Discontinued       As needed 09/11/12 2050 09/12/12 0030   09/11/12 1923  ceFAZolin (ANCEF) 2-3 GM-% IVPB SOLR    Comments:  Toney Sang   : cabinet override      09/11/12 1923 09/11/12 2310      Assessment: 26 YO admitted 09/11/12 for [facility] and RMCA aneurysm, 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   Infectious Disease: Vanc/Zosyn D#8, empiric for fever, s/p tx for asp PNA, Tmax 100.1, WBC down 9.9. Per CCM plans to d/c abx after 9/29 doses  Fluconazole 9/29>>10/2 Vanc 9/22 >>     -9/25 VT 8.6 on 500 q8h, incr 1000 q8h      -9/26 VT 12.7 inc to 1250mg  q8 Zosyn 9/22 >> Unasyn 9/12 >> 9/21  9/12 BCx - negative 9/12 RCx - normal oral flora 9/14, 9/25 Cdiff - negative 9/17 UCx - negative 9/22 CSF - negative 9/22 BCx - negative 9/22 urine - E coli (only R to amp) 9/24 resp: neg 9/25 c. Diff neg   Goal of Therapy:  Vancomycin trough level 15-20 mcg/ml  Plan:  Vancomycin 1250mg  IV q8hr. D/c abx today? Zosyn 3.375g IV q8har. D/c abx today?   Farhad Burleson S. Merilynn Finland,  PharmD, [facility] Clinical Staff Pharmacist Pager [phone]  Misty Stanley Stillinger 09/29/2012,10:13 AM

## 2012-09-29 NOTE — Progress Notes (Signed)
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  LINES / TUBES: 9/12 ETT >>9/23 9/12 Lt [place] >> 9/23 PEG tube >>> 9/23 Tracheostomy (jy) >>>  CULTURES: 9/12 respiratory culture >>neg 9/12 bld >> neg 9/14 C-diff pcr>>>neg 9/18 BCx2>>>neg 9/17 UC>>>neg Blood 9/22>>>NTD Urine 9/22>>>E. Coli sensitive to zosyn. 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 Following commands.  No events overnight.  Spoke with father extensively today, informed him that the patient will likely need trach but will need to speak with NS prior to proceeding further with either extubation or trach/peg to determine prognosis.  Father is ok with whatever recommendations we make. 9/20 - Waxing and waning mental status. More drowsy. Doing SBT but not extubation candidate 9/23- PEG Tube, Tracheostomy 9/23- Decreased LOC, hypotensive, increased leukocytosis, decreased Hgb. No new acute Hem or HCP on CT. Pt was given 3U PRBC and Levophed.   SUBJECTIVE/OVERNIGHT/INTERVAL HX:   No events overnight.  Vent overnight.  VITAL SIGNS: Temp:  [98.1 F (36.7 C)-100.1 F (37.8 C)] 98.6 F (37 C) (09/29 0800) Pulse Rate:  [41-107] 86 (09/29 1000) Resp:  [12-24] 15 (09/29 1000) BP: (122-143)/(70-96) 136/86 mmHg (09/29 1000) SpO2:  [96 %-100 %] 100 % (09/29 1000) FiO2  (%):  [28 %] 28 % (09/29 0900) Weight:  [60.2 kg (132 lb 11.5 oz)] 60.2 kg (132 lb 11.5 oz) (09/29 0500)  VENTILATOR SETTINGS: Vent Mode:  [-]  FiO2 (%):  [28 %] 28 %  INTAKE / OUTPUT: Intake/Output     09/28 0701 - 09/29 0700 09/29 0701 - 09/30 0700   I.V. (mL/kg) 4153.3 (69) 576.7 (9.6)   Other  230   NG/GT 1640 180   IV Piggyback 900 262.5   Total Intake(mL/kg) 6693.3 (111.2) 1249.2 (20.8)   Urine (mL/kg/hr) 6295 (4.4) 1360 (6.4)   Drains 97 (0.1) 6 (0)   Stool 500 (0.3)    Total Output 6892 1366   Net -198.7 -116.8         PHYSICAL EXAMINATION: Gen: young female, NAD on vent  HEENT: scalp dressing in place, drain in place, ETT in place PULM: resps even non labored on TC CV: s1 s2 RRR, loud diastolic/systolic murmur, no JVD AB: BS+, soft, nontender, no hsm, PEG tube in place Ext: warm, no edema, no clubbing, no cyanosis Derm: no rash or skin breakdown Neuro: eyes open, no response to verbal command, occ follow commands  LABS: PULMONARY  Recent Labs Lab 09/23/12 0354 09/23/12 1934 09/24/12 1420  PHART 7.460* 7.509* 7.419  PCO2ART 31.8* 27.3* 28.6*  PO2ART 32.0* 134.0* 132.0*  HCO3 22.4 21.4 18.2*  TCO2 23 22.2 19.0  O2SAT 63.0 99.1 99.4   CBC  Recent Labs Lab 09/27/12 0500 09/28/12 0256 09/29/12 0500  HGB 9.3* 9.8* 9.7*  HCT 27.1* 28.2* 28.5*  WBC 13.6* 12.1* 9.9  PLT 182 153 151   CHEMISTRY  Recent Labs Lab 09/24/12 0407 09/25/12 0400 09/26/12 0505 09/27/12 0500 09/28/12 0256 09/29/12 0500  NA 136 136 133* 133* 139 137  K 3.8 3.2* 3.3* 3.5 3.2* 3.3*  CL 105 107 102 100 103 100  CO2 19 18* 19 22 27 28   GLUCOSE 202* 165* 165* 169* 130* 106*  BUN 31* 25* 18 13 11 13   CREATININE 0.61 0.48* 0.40* 0.36* 0.35* 0.34*  CALCIUM 8.7 8.2* 8.5 8.9 9.0 8.9  MG 2.1  --  1.8 1.9 1.8 1.7  PHOS 4.1  --  2.6 3.1 3.0 3.7   Estimated Creatinine Clearance: 90.4 ml/min (by C-G formula based on Cr of 0.34).  ENDOCRINE CBG (last 3)   Recent Labs   09/28/12 2349 09/29/12 0310 09/29/12 0823  GLUCAP 107* 123* 120*    IMAGING x48h  No results found. 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 - Cont keppra. - HHH therapy per NS. - TCD per NS. - Work up hyponatremia. - Recommend discontinuation of IVF at this point since Na is improving with florinef.  PULMONARY A:  Post op respiratory failure > s/p trach 9/23, on vent FiO2 30%, O2 sat 100% CAP vs aspiration pneumonia (favor aspiration) - 9/24 CXR, increased opacificationRLL Asthma - without exacerbation P:   - RLL infiltrate with elevation in WBC. - See ID section. - Maintain on TC as tolerated.  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 HTN - Echo with grade 2 diastolic dysfunction, thickening and EF of 60-65%. 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 - D/Ced levophed. - MAP goals 85-105. - CVP of 12, will continue to follow. - Cortisol level 22.4, continue stress dose steroids, if remains hypertensive then will half the dose of stress dose steroids in AM.  RENAL A:   Hyponatremia, mild in setting brain edema.  CSW more likely given that urine Na is equal to Na intake (156 to 154 in saline).  Sodium stabilized with matching intake and input.  CSW more likely given that Na is not rising even with high Na intake. P:   - F/u chem in am. - Avoid free water, brain edema. - No lasix with HHH. - Recommend d/c of IVF. - Continue fludrocortisone.  GASTROINTESTINAL A:   Dysphagia - in setting of AMS / Resp fx No active bleeding noted P:   - TF as tolerated. - Pantoprazole - Imodium for diarrhea (C. Diff negative likely TF related).  HEMATOLOGIC A:   Anemia, s/p 3U PRBC 9/24 > good response P:  - Monitor h/h - Transfusion threshold  < 7gm/dl - SCD  INFECTIOUS A:   Aspiration pneumonia.  9/24 Febrile, chest Xray shows worsening of RLL opacification  UTI P:   - Recultured 9/22 as above. - CSF culture negative.   - Vanc/zosyn per pharmacy will d/c after 9/29 doses. - pCXR in am to re eval infiltrate rt. - Changed foley.  ENDOCRINE A:   Single episode of hyperglycemia, may be transient due to steroids  P:   - Monitor glucose.  Na improving with fludrocortisone, BP improved with hydrocortisone stress dose, spoke with Dr. Jeral Fruit, will defer VP shunt decision to NS at this point.  Alyson Reedy, M.D. [facility] Pulmonary/Critical Care Medicine.  Pager: [phone]. After hours pager: [phone].

## 2012-09-29 NOTE — Progress Notes (Signed)
[facility] ADULT ICU REPLACEMENT PROTOCOL FOR AM LAB REPLACEMENT ONLY  The patient does apply for the [facility] Adult ICU Electrolyte Replacment Protocol based on the criteria listed below:   1. Is GFR >/= 40 ml/min? yes  Patient's GFR today is >90 2. Is urine output >/= 0.5 ml/kg/hr for the last 6 hours? yes Patient's UOP is 2.9 ml/kg/hr 3. Is BUN < 60 mg/dL? yes  Patient's BUN today is 13 4. Abnormal electrolyte(s): K+3.3 5. Ordered repletion withprotocol 6. If a panic level lab has been reported, has the CCM MD in charge been notified? yes.   Physician:  Dr Sela Hilding, Einar Gip 09/29/2012 6:46 AM

## 2012-09-30 ENCOUNTER — Inpatient Hospital Stay (HOSPITAL_COMMUNITY): Payer: Medicaid Other

## 2012-09-30 LAB — GLUCOSE, CAPILLARY
Glucose-Capillary: 124 mg/dL — ABNORMAL HIGH (ref 70–99)
Glucose-Capillary: 143 mg/dL — ABNORMAL HIGH (ref 70–99)
Glucose-Capillary: 144 mg/dL — ABNORMAL HIGH (ref 70–99)

## 2012-09-30 LAB — BASIC METABOLIC PANEL
BUN: 17 mg/dL (ref 6–23)
CO2: 30 mEq/L (ref 19–32)
Chloride: 100 mEq/L (ref 96–112)
Creatinine, Ser: 0.38 mg/dL — ABNORMAL LOW (ref 0.50–1.10)
GFR calc Af Amer: >90 mL/min (ref >90)
Potassium: 3.8 mEq/L (ref 3.5–5.1)

## 2012-09-30 LAB — MAGNESIUM: Magnesium: 2.2 mg/dL (ref 1.5–2.5)

## 2012-09-30 MED ORDER — HYDROCORTISONE SOD SUCCINATE 100 MG IJ SOLR
25.0000 mg | Freq: Four times a day (QID) | INTRAMUSCULAR | Status: DC
  Administered 2012-09-30 – 2012-10-04 (×16): 25 mg via INTRAVENOUS
  Filled 2012-09-30 (×20): qty 0.5

## 2012-09-30 MED ORDER — POTASSIUM CHLORIDE 20 MEQ/15ML (10%) PO LIQD
40.0000 meq | Freq: Three times a day (TID) | ORAL | Status: AC
  Administered 2012-09-30 (×2): 40 meq
  Filled 2012-09-30 (×2): qty 30

## 2012-09-30 NOTE — Progress Notes (Signed)
NUTRITION FOLLOW UP  Intervention:    1. Continue Jevity 1.2 @ 60 ml/hr  2. 30 ml Prostat daily  Tube feeding regimen provides 1828 kcal, 95 grams of protein, and 1167 ml of H2O.    Nutrition Dx:   Inadequate oral intake related to inability to eat as evidenced by NPO status; ongoing.    Goal:  Intake to meet >90% of estimated nutrition needs, met.   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 Pt may need VP shunt. Pt unable to be extubated. Trach and PEG 9/23. Pt now on trach collar.  Pt has had continued diarrhea via flexiseal without improvement despite TF changes. Pt started on scheduled imodium yesterday. Pt discussed during ICU rounds and with RN.  Per RN diarrhea has improved so far today.    Height: Ht Readings from Last 1 Encounters:  09/11/12 5\' 1"  (1.549 m)    Weight Status:   Wt Readings from Last 1 Encounters:  09/30/12 129 lb 10.1 oz (58.8 kg)  Admission weight: 118 lb 9/11  Re-estimated needs:  Kcal: 1700-1900 Protein: 80-100 grams  Fluid: >1.5 L/day  Skin: head and abd incision  Diet Order: NPO   Intake/Output Summary (Last 24 hours) at 09/30/12 1115 Last data filed at 09/30/12 1100  Gross per 24 hour  Intake   5465 ml  Output   4459 ml  Net   1006 ml    Last BM: via rectal pouch which was inserted 9/14 650 ml 9/29 500 ml 9/28 625 ml 9/27  Labs:   Recent Labs Lab 09/28/12 0256 09/29/12 0500 09/30/12 0335 09/30/12 0741  NA 139 137  --  137  K 3.2* 3.3*  --  3.8  CL 103 100  --  100  CO2 27 28  --  30  BUN 11 13  --  17  CREATININE 0.35* 0.34*  --  0.38*  CALCIUM 9.0 8.9  --  9.0  MG 1.8 1.7 2.2  --   PHOS 3.0 3.7 4.0  --   GLUCOSE 130* 106*  --  125*    CBG (last 3)   Recent Labs  09/29/12 2022 09/29/12 2352 09/30/12 0302  GLUCAP 129* 123* 124*    Scheduled Meds: . antiseptic oral rinse  15 mL Mouth Rinse QID   . chlorhexidine  15 mL Mouth Rinse BID  . feeding supplement  30 mL Per Tube Daily  . fluconazole  200 mg Per Tube Daily  . fludrocortisone  0.1 mg Per Tube Daily  . heparin subcutaneous  5,000 Units Subcutaneous Q8H  . hydrocortisone sodium succinate  25 mg Intravenous Q6H  . insulin aspart  2-6 Units Subcutaneous Q4H  . levETIRAcetam  500 mg Per Tube Q12H  . loperamide  4 mg Per Tube BID  . NiMODipine  60 mg Per Tube Q4H   Or  . niMODipine  60 mg Oral Q4H  . oxybutynin  2.5 mg Per Tube BID  . pantoprazole sodium  40 mg Per Tube Q1200  . potassium chloride  40 mEq Per Tube TID  . senna-docusate  1 tablet Oral BID  . simvastatin  80 mg Oral QHS    Continuous Infusions: . sodium chloride 100 mL/hr at 09/30/12 0516  . sodium chloride 20 mL/hr at 09/24/12 0027  . feeding supplement (JEVITY 1.2 CAL) 1,000 mL (09/30/12 0745)  . norepinephrine (LEVOPHED) Adult infusion Stopped (09/27/12  1513)    Kendell Bane RD, LDN, CNSC [phone] Pager [phone] After Hours Pager

## 2012-09-30 NOTE — Progress Notes (Signed)
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 Intubated from 9/12 to 9/23. Trach and PEG 9/23. Pt now reportedly f/c on right side. Has been on trach collar since 9/26    Clinical Impression   PTA, pt independent with ADL and mobility - lived with family. Pt with significant functional decline and requires total A +2 with mobility and total A with ADL. If family can provide 24/7 assistance, CIR will be an option to facilitate D/C home. Per father, he wants to be able to care for her at home, and is physically able to do so. Pt will benefit from skilled OT services to facilitate D/C to next venue due to below deficits.    OT Assessment  Patient needs continued OT Services    Follow Up Recommendations  CIR    Barriers to Discharge Other (comment) (unsure of home situation)    Equipment Recommendations  3 in 1 bedside comode;Tub/shower bench;Wheelchair (measurements OT);Wheelchair cushion (measurements OT);Hospital bed    Recommendations for Other Services Rehab consult  Frequency  Min 2X/week    Precautions / Restrictions Precautions Precautions: Fall;Other (comment) (ventic drain) Restrictions Weight Bearing Restrictions: No   Pertinent Vitals/Pain Vitals stable throughout session.    ADL  Eating/Feeding: NPO Grooming: Other (comment) (assisting with self suctioning appropriately and wiping mout) Where Assessed - Grooming: Supported sitting ADL Comments: total A for all ADL    OT Diagnosis:    OT Problem List: Decreased strength;Decreased range of motion;Decreased activity tolerance;Impaired balance (sitting and/or standing);Impaired vision/perception;Decreased coordination;Decreased  cognition;Decreased safety awareness;Decreased knowledge of use of DME or AE;Cardiopulmonary status limiting activity;Decreased knowledge of precautions;Impaired sensation;Impaired tone;Impaired UE functional use;Increased edema OT Treatment Interventions: Self-care/ADL training;Therapeutic exercise;Neuromuscular education;Therapeutic activities;Cognitive remediation/compensation;Patient/family education;Visual/perceptual remediation/compensation;Balance training   OT Goals(Current goals can be found in the care plan section) Acute Rehab OT Goals Patient Stated Goal: unable to stae OT Goal Formulation: With family Time For Goal Achievement: 10/14/12 Potential to Achieve Goals: Good  Visit Information  Last OT Received On: 09/30/12 Assistance Needed: +2 PT/OT Co-Evaluation/Treatment: Yes (medically complex)       Prior Functioning     Home Living Family/patient expects to be discharged to:: Private residence Living Arrangements: Parent Available Help at Discharge: Available 24 hours/day Type of Home: Apartment Home Access: Stairs to enter [employer] of Steps: 2 Home Layout: One level Additional Comments: PTA, lived at home. did not work or go to school. Was on disability. Dad states she like to sleep, and hand out with her friends.   Lives With: Family Prior Function Level of Independence: Independent Communication Communication: Expressive difficulties;Tracheostomy Dominant Hand: Right         Vision/Perception Vision - History Patient Visual Report: Other (comment) (pt does not report diplopia) Vision - Assessment Eye Alignment: Impaired (comment) Vision Assessment: Vision impaired - to be further tested in functional context Additional Comments: R gaze preference. Abnormal occulomotor ROM. will further assess` Perception Perception: Impaired Praxis Praxis: Impaired Praxis Impairment Details: Initiation   Cognition  Cognition Arousal/Alertness:  Lethargic Behavior During Therapy: Flat affect Overall Cognitive Status: Impaired/Different from baseline Area of Impairment: Following commands Following Commands: Follows one step commands inconsistently General Comments: gives thumbs up for yes/thumbs down for no    Extremity/Trunk Assessment Upper Extremity  Assessment Upper Extremity Assessment: RUE deficits/detail;LUE deficits/detail RUE Deficits / Details: using RUE for functional tasks. normal tone. weaker proximally. RUE Sensation:  (appears normal) RUE Coordination: decreased fine motor;decreased gross motor LUE Deficits / Details: no movement observed LUE Coordination: decreased fine motor;decreased gross motor Lower Extremity Assessment Lower Extremity Assessment: RLE deficits/detail;LLE deficits/detail RLE Deficits / Details: purposeful movement observed. Pt kicking leg while sitting EOB LLE Deficits / Details: no movement observed. L shoulder subluxation Cervical / Trunk Assessment Cervical / Trunk Assessment: Other exceptions (did not observe righting rxns. poor postural control. attemp) Cervical / Trunk Exceptions: righting rxns not observed. using RUE to propr. Attempted to extend head at times. did not initiate head turns     Mobility Bed Mobility Bed Mobility: Supine to Sit Supine to Sit: 1: +2 Total assist Supine to Sit: Patient Percentage: 0%     Exercise Other Exercises Other Exercises: RUE A/AAROM within normal ROM Other Exercises: LUE PROM   Balance Balance Balance Assessed: Yes Static Sitting Balance Static Sitting - Balance Support: Feet supported;Bilateral upper extremity supported (propping with RUE) Static Sitting - Level of Assistance: 1: +1 Total assist Static Sitting - Comment/# of Minutes: 15 min   End of Session OT - End of Session Activity Tolerance: Patient tolerated treatment well Patient left: in bed;with call bell/phone within reach Nurse Communication: Mobility status  GO      Jannat Rosemeyer,[name] 09/30/2012, 10:22 AM Luisa Dago, OTR/L  [phone] 09/30/2012

## 2012-09-30 NOTE — Progress Notes (Signed)
Pt seen and examined. No issues overnight. EVD patent, output ~30cc during one hour wihtout any neurologic changes.  EXAM: Temp:  [97.8 F (36.6 C)-98.6 F (37 C)] 97.8 F (36.6 C) (09/30 0400) Pulse Rate:  [65-91] 65 (09/30 0700) Resp:  [0-32] 0 (09/30 0700) BP: (106-143)/(52-93) 121/75 mmHg (09/30 0700) SpO2:  [98 %-100 %] 100 % (09/30 0700) FiO2 (%):  [28 %] 28 % (09/30 0401) Weight:  [58.8 kg (129 lb 10.1 oz)] 58.8 kg (129 lb 10.1 oz) (09/30 0350) Intake/Output     09/29 0701 - 09/30 0700 09/30 0701 - 10/01 0700   I.V. (mL/kg) 2476.7 (42.1)    Other 930    NG/GT 1440    IV Piggyback 950    Total Intake(mL/kg) 5796.7 (98.6)    Urine (mL/kg/hr) 4795 (3.4)    Drains 55 (0)    Stool 650 (0.5)    Total Output 5500     Net +296.7           EVD in place, open at 15cc, 55cc x 24hrs Awake, alert. Intubated, breathing over vent Follows commands RUE/RLE Intermittently FC LLE, not FC LUE Wound c/d/i  LABS: Lab Results  Component Value Date   CREATININE 0.34* 09/29/2012   BUN 13 09/29/2012   NA 137 09/29/2012   K 3.3* 09/29/2012   CL 100 09/29/2012   CO2 28 09/29/2012   Lab Results  Component Value Date   WBC 9.9 09/29/2012   HGB 9.7* 09/29/2012   HCT 28.5* 09/29/2012   MCV 81.2 09/29/2012   PLT 151 09/29/2012    IMAGING: No new imaging overnight.  IMPRESSION: - 27 y.o. female SAHd# 79, s/p RMCA aneurysm clipping - Neurologically stable with EVD at with minimal output.  PLAN: - Baseline CTH today, then clamp EVD. If neurologically unchanged, can repeat CT in 24-48 hrs before removal. - PT/OT - Cont TF, trach collar. PM valve per SLP - Abx per CCM

## 2012-09-30 NOTE — Progress Notes (Signed)
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  LINES / TUBES: 9/12 ETT >>9/23 9/12 Lt Adams >> 9/23 PEG tube >>> 9/23 Tracheostomy (jy) >>>  CULTURES: 9/12 respiratory culture >>neg 9/12 bld >> neg 9/14 C-diff pcr>>>neg 9/18 BCx2>>>neg 9/17 UC>>>neg Blood 9/22>>>NTD Urine 9/22>>>E. Coli sensitive to zosyn. 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 Following commands.  No events overnight.  Spoke with father extensively today, informed him that the patient will likely need trach but will need to speak with NS prior to proceeding further with either extubation or trach/peg to determine prognosis.  Father is ok with whatever recommendations we make. 9/20 - Waxing and waning mental status. More drowsy. Doing SBT but not extubation candidate 9/23- PEG Tube, Tracheostomy 9/23- Decreased LOC, hypotensive, increased leukocytosis, decreased Hgb. No new acute Hem or HCP on CT. Pt was given 3U PRBC and Levophed.   SUBJECTIVE/OVERNIGHT/INTERVAL HX:   No events overnight.  Vent overnight.  VITAL SIGNS: Temp:  [97.8 F (36.6 C)-98.6 F (37 C)] 98.1 F (36.7 C) (09/30 0800) Pulse Rate:  [61-97] 97 (09/30 0900) Resp:  [0-32] 18 (09/30 0900) BP: (106-137)/(52-93) 133/78 mmHg (09/30 0900) SpO2:  [98 %-100 %] 98 % (09/30 0900) FiO2  (%):  [28 %] 28 % (09/30 0800) Weight:  [58.8 kg (129 lb 10.1 oz)] 58.8 kg (129 lb 10.1 oz) (09/30 0350)  VENTILATOR SETTINGS: Vent Mode:  [-]  FiO2 (%):  [28 %] 28 %  INTAKE / OUTPUT: Intake/Output     09/29 0701 - 09/30 0700 09/30 0701 - 10/01 0700   I.V. (mL/kg) 2476.7 (42.1) 200 (3.4)   Other 930 90   NG/GT 1440 120   IV Piggyback 950 250   Total Intake(mL/kg) 5796.7 (98.6) 660 (11.2)   Urine (mL/kg/hr) 4795 (3.4) 35 (0.2)   Drains 55 (0)    Stool 650 (0.5)    Total Output 5500 35   Net +296.7 +625         PHYSICAL EXAMINATION: Gen: young female, NAD on vent  HEENT: scalp dressing in place, drain in place, ETT in place PULM: resps even non labored on TC CV: s1 s2 RRR, loud diastolic/systolic murmur, no JVD AB: BS+, soft, nontender, no hsm, PEG tube in place Ext: warm, no edema, no clubbing, no cyanosis Derm: no rash or skin breakdown Neuro: eyes open, no response to verbal command, occ follow commands  LABS: PULMONARY  Recent Labs Lab 09/23/12 1934 09/24/12 1420  PHART 7.509* 7.419  PCO2ART 27.3* 28.6*  PO2ART 134.0* 132.0*  HCO3 21.4 18.2*  TCO2 22.2 19.0  O2SAT 99.1 99.4   CBC  Recent Labs Lab 09/27/12 0500 09/28/12 0256 09/29/12 0500  HGB 9.3* 9.8* 9.7*  HCT 27.1* 28.2* 28.5*  WBC 13.6* 12.1* 9.9  PLT 182 153 151   CHEMISTRY  Recent Labs Lab 09/26/12 0505 09/27/12 0500 09/28/12  1610 09/29/12 0500 09/30/12 0335 09/30/12 0741  NA 133* 133* 139 137  --  137  K 3.3* 3.5 3.2* 3.3*  --  3.8  CL 102 100 103 100  --  100  CO2 19 22 27 28   --  30  GLUCOSE 165* 169* 130* 106*  --  125*  BUN 18 13 11 13   --  17  CREATININE 0.40* 0.36* 0.35* 0.34*  --  0.38*  CALCIUM 8.5 8.9 9.0 8.9  --  9.0  MG 1.8 1.9 1.8 1.7 2.2  --   PHOS 2.6 3.1 3.0 3.7 4.0  --    Estimated Creatinine Clearance: 89.4 ml/min (by C-G formula based on Cr of 0.38).  ENDOCRINE CBG (last 3)   Recent Labs  09/29/12 2022 09/29/12 2352 09/30/12 0302  GLUCAP 129* 123*  124*    IMAGING x48h  No results found. 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 - Cont keppra. - HHH therapy per NS. - TCD per NS. - Work up hyponatremia. - Recommend discontinuation of IVF at this point since Na is improving with florinef.  PULMONARY A:  Post op respiratory failure > s/p trach 9/23, on vent FiO2 30%, O2 sat 100% CAP vs aspiration pneumonia (favor aspiration) - 9/24 CXR, increased opacificationRLL Asthma - without exacerbation P:   - RLL infiltrate with elevation in WBC. - See ID section. - Maintain on TC as tolerated.  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 HTN - Echo with grade 2 diastolic dysfunction, thickening and EF of 60-65%. 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 - D/Ced levophed. - MAP goals 85-105. - CVP of 12, will continue to follow. - Cortisol level 22.4, decrease stress dose steroids by half.  RENAL A:   Hyponatremia, mild in setting brain edema.  CSW more likely given that urine Na is equal to Na intake (156 to 154 in saline).  Sodium stabilized with matching intake and input.  CSW more likely given that Na is not rising even with high Na intake. P:   - F/u chem in am. - Avoid free water, brain edema. - No lasix with HHH. - Recommend d/c of IVF. - Continue fludrocortisone.  GASTROINTESTINAL A:   Dysphagia - in setting of AMS / Resp fx No active bleeding noted P:   - TF as tolerated. - Pantoprazole - Imodium for diarrhea (C. Diff negative likely TF related).  HEMATOLOGIC A:   Anemia, s/p 3U PRBC 9/24 > good response P:  - Monitor h/h - Transfusion threshold < 7gm/dl - SCD  INFECTIOUS A:   Aspiration pneumonia.  9/24 Febrile, chest Xray shows worsening of RLL opacification  UTI P:    - Recultured 9/22 as above. - CSF culture negative.   - D/C abx. - pCXR in am to re eval infiltrate rt. - Changed foley.  ENDOCRINE A:   Single episode of hyperglycemia, may be transient due to steroids  P:   - Monitor glucose.  Na stable with fludrocortisone, BP improved with hydrocortisone stress dose, ventric to be clamped if CT looks acceptable today.  Maintain on TC, monitor BP and decrease hydrocortisone to 25 mg q6 hours.  Alyson Reedy, M.D. [facility] Pulmonary/Critical Care Medicine. Pager:  [phone]. After hours pager: [phone].

## 2012-09-30 NOTE — Progress Notes (Signed)
Rehab Admissions Coordinator Note:  Patient was screened by Brock Ra for appropriateness for an Inpatient Acute Rehab Consult.  At this time, we are recommending Inpatient Rehab consult.  Brock Ra 09/30/2012, 12:20 PM  I can be reached at [phone].

## 2012-09-30 NOTE — Evaluation (Signed)
Passy-Muir Speaking Valve - Evaluation Patient Details  Name: Sally Nelson MRN: [id number] Date of Birth: April 24, 1985  Today's Date: 09/30/2012 Time: 0905-0920 SLP Time Calculation (min): 15 min  Past Medical History:  Past Medical History  Diagnosis Date  . Heart murmur   . Asthma    Past Surgical History:  Past Surgical History  Procedure Laterality Date  . No past surgeries    . Radiology with anesthesia N/A 09/11/2012    Procedure: RADIOLOGY WITH ANESTHESIA-CEREBRAL ANEURYSM COILING;  Surgeon: Medication Radiologist, MD;  Location: [facility] OR;  Service: Radiology;  Laterality: N/A;  . Craniotomy Right 09/11/2012    Procedure: CRANIOTOMY FOR CLIPPING OF  ANEURYSM ;  Surgeon: Lisbeth Renshaw, MD;  Location: [facility] NEURO ORS;  Service: Neurosurgery;  Laterality: Right;  . 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 Intubated from 9/12 to 9/23. Trach and PEG 9/23. Pt now reportedly f/c on right side. Has been on trach collar since 9/26.    Assessment / Plan / Recommendation Clinical Impression  Pt demonstrating good tolerance of cuff deflation with intermittent oral expectoration of secretions. PMSV placement is briefly tolerated, but results in slow increase in RR with mild evidence of CO2 trapping, indicative of incomplete redirection of air to the upper airway. Pt has moderate coughing initially, likely due to increased sensation of secretions. Audible phonation heard, but no attempts at speech or volitional phonation despite max cues. Recommend pts cuff remain deflated, ok'd by MD. PMSV to be placed by SLP only. Pt may need cuffless, smaller trach to fully progress with PMSV.     SLP Assessment  Patient needs continued  Speech Lanaguage Pathology Services    Follow Up Recommendations  Inpatient Rehab    Frequency and Duration min 3x week  2 weeks   Pertinent Vitals/Pain NA    SLP Goals Potential to Achieve Goals: Good Potential Considerations: Financial resources;Family/community support;Severity of impairments SLP Goal #1: Pt will initiate phonation with max cues during automatic speech task.  SLP Goal #1 - Progress: Progressing toward goal SLP Goal #2: Pt will tolerate PMSV placement during all waking hours with supervision.  SLP Goal #2 - Progress: Progressing toward goal SLP Goal #3: Pt will utilize thumbs up/down with max verbal cues to communicate basic wants needs.  SLP Goal #3 - Progress: Progressing toward goal SLP Goal #4: Pt will sustain attention to basic communicative task for 1 minute with max verbal cues.  SLP Goal #4 - Progress: Progressing toward goal   PMSV Trial  PMSV was placed for: 3 minute intervals Able to redirect subglottic air through upper airway: Yes Able to Attain Phonation:  (with cough) Able to Expectorate Secretions: Yes Level of Secretion Expectoration with PMSV: Oral Breath Support for Phonation: Inadequate Intelligibility: Unable to assess (comment) Respirations During Trial: 22 SpO2 During Trial: 98 %   Tracheostomy Tube       Vent Dependency  FiO2 (%): 28 %    Cuff Deflation Trial Tolerated Cuff Deflation: Yes Length of Time for Cuff Deflation Trial: 45 Behavior: Alert;Cooperative   Sally Nelson, Sally Nelson 09/30/2012, 10:10 AM

## 2012-09-30 NOTE — Progress Notes (Signed)
Physical Therapy Evaluation Patient Details Name: Sally Nelson MRN: [id number] DOB: 1985-06-15 Today's Date: 09/30/2012 Time: 0920-0956 PT Time Calculation (min): 36 min  PT Assessment / Plan / Recommendation History of Present Illness   Pt admit with R MCA aneurysm 9/11 with clipping and crainectomy.  On 9/12, developed R SDH with R MCA CVA with need for right decompressive hemicraniectomy.  VDRF with trach and PEG.    Clinical Impression  Pt admitted with above. Pt currently with functional limitations due to the deficits listed below (see PT Problem List). Will need Rehab for extensive therapy to progress mobility.  Tried at end of session to set up soft touch call bell for pt to use but pt too fatigued to stay awake and practice.  Nursing will keep working with pt with this.   Pt will benefit from skilled PT to increase their independence and safety with mobility to allow discharge to the venue listed below.     PT Assessment  Patient needs continued PT services    Follow Up Recommendations  CIR;Supervision/Assistance - 24 hour                Equipment Recommendations  Other (comment) (TBA)    Recommendations for Other Services Rehab consult   Frequency Min 3X/week    Precautions / Restrictions Precautions Precautions: Fall;Other (comment) (ventic drain) Restrictions Weight Bearing Restrictions: No   Pertinent Vitals/Pain VSS, No pain reported.        Mobility  Bed Mobility Bed Mobility: Supine to Sit Supine to Sit: 1: +2 Total assist Supine to Sit: Patient Percentage: 0% Transfers Transfers: Not assessed Ambulation/Gait Ambulation/Gait Assistance: Not tested (comment) Stairs: No Wheelchair Mobility Wheelchair Mobility: No Modified Rankin (Stroke Patients Only) Pre-Morbid Rankin Score: No significant disability Modified Rankin: Severe disability    Exercises General Exercises - Lower Extremity Ankle Circles/Pumps: PROM;Both;5 reps;Supine Long Arc Quad:  AAROM;Right;5 reps;Seated Other Exercises Other Exercises: RUE A/AAROM within normal ROM Other Exercises: LUE PROM   PT Diagnosis: Generalized weakness  PT Problem List: Decreased activity tolerance;Decreased strength;Decreased range of motion;Decreased balance;Decreased mobility;Decreased coordination;Decreased cognition;Decreased knowledge of use of DME;Decreased safety awareness;Decreased knowledge of precautions PT Treatment Interventions: DME instruction;Functional mobility training;Therapeutic activities;Therapeutic exercise;Balance training;Neuromuscular re-education;Cognitive remediation;Patient/family education     PT Goals(Current goals can be found in the care plan section) Acute Rehab PT Goals Patient Stated Goal: unable to stae PT Goal Formulation: With patient Time For Goal Achievement: 10/14/12 Potential to Achieve Goals: Good  Visit Information  Last PT Received On: 09/30/12 Assistance Needed: +2 PT/OT Co-Evaluation/Treatment: Yes       Prior Functioning  Home Living Family/patient expects to be discharged to:: Private residence Living Arrangements: Parent Available Help at Discharge: Available 24 hours/day Type of Home: Apartment Home Access: Stairs to enter [employer] of Steps: 2 Home Layout: One level Additional Comments: PTA, lived at home. did not work or go to school. Was on disability. Dad states she like to sleep, and hand out with her friends.   Lives With: Family Prior Function Level of Independence: Independent Communication Communication: Expressive difficulties;Tracheostomy Dominant Hand: Right    Cognition  Cognition Arousal/Alertness: Lethargic Behavior During Therapy: Flat affect Overall Cognitive Status: Impaired/Different from baseline Area of Impairment: Following commands Following Commands: Follows one step commands inconsistently General Comments: gives thumbs up for yes/thumbs down for no    Extremity/Trunk  Assessment Upper Extremity Assessment Upper Extremity Assessment: Defer to OT evaluation RUE Deficits / Details: using RUE for functional tasks. normal tone. weaker proximally.  RUE Sensation:  (appears normal) RUE Coordination: decreased fine motor;decreased gross motor LUE Deficits / Details: no movement observed LUE Coordination: decreased fine motor;decreased gross motor Lower Extremity Assessment Lower Extremity Assessment: RLE deficits/detail;LLE deficits/detail RLE Deficits / Details: purposeful movement observed. Pt kicking leg while sitting EOB LLE Deficits / Details: no movement observed Cervical / Trunk Assessment Cervical / Trunk Assessment: Other exceptions (did not observe righting rxns. poor postural control. attemp) Cervical / Trunk Exceptions: righting rxns not observed. using RUE to propr. Attempted to extend head at times. did not initiate head turns   Balance Balance Balance Assessed: Yes Static Sitting Balance Static Sitting - Balance Support: Feet supported;Bilateral upper extremity supported (propping with RUE) Static Sitting - Level of Assistance: 1: +1 Total assist Static Sitting - Comment/# of Minutes: 15 min  End of Session PT - End of Session Equipment Utilized During Treatment: Oxygen (trach) Activity Tolerance: Patient limited by fatigue Patient left: in bed;with call bell/phone within reach;with restraints reapplied Nurse Communication: Mobility status;Need for lift equipment       INGOLD,Kimberlyn Quiocho 09/30/2012, 11:32 AM Audree Camel Acute Rehabilitation [phone] (pager)

## 2012-09-30 NOTE — Evaluation (Signed)
Speech Language Pathology Evaluation Patient Details Name: Sally Nelson MRN: [id number] DOB: January 16, 1985 Today's Date: 09/30/2012 Time: 0900-0920 SLP Time Calculation (min): 20 min  Problem List:  Patient Active Problem List   Diagnosis Date Noted  . 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 HTN (hypertension) 09/15/2012  . Hypokalemia 09/15/2012  . Hypophosphatemia 09/15/2012  . Respiratory failure, post-operative 09/12/2012  . Aspiration pneumonia 09/12/2012  . SAH (subarachnoid hemorrhage) 09/12/2012   Past Medical History:  Past Medical History  Diagnosis Date  . Heart murmur   . Asthma    Past Surgical History:  Past Surgical History  Procedure Laterality Date  . No past surgeries    . Radiology with anesthesia N/A 09/11/2012    Procedure: RADIOLOGY WITH ANESTHESIA-CEREBRAL ANEURYSM COILING;  Surgeon: Medication Radiologist, MD;  Location: [facility] OR;  Service: Radiology;  Laterality: N/A;  . Craniotomy Right 09/11/2012    Procedure: CRANIOTOMY FOR CLIPPING OF  ANEURYSM ;  Surgeon: Lisbeth Renshaw, MD;  Location: [facility] NEURO ORS;  Service: Neurosurgery;  Laterality: Right;  . 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 Intubated from 9/12 to 9/23. Trach and PEG 9/23. Pt now reportedly f/c on right side. Has been on trach collar since 9/26.    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 Intermittent focused to briefly sustained attention. Intermittently follows basic commands, indicating good auditory comprehension but  poorly sustained attention to verbal tasks. Pt responds well to contextual visual and verbal cues. Tracks  visually into right visual field, appears to prefer lower quadrants. Cued pt to use thumbs up and down to respond to Y/N questions which she completed with 100% accuracy with basic biographical and environmental questions, but questionable accuracy with more subjective questions.     SLP Assessment  Patient needs continued Speech Lanaguage Pathology Services    Follow Up Recommendations  Inpatient Rehab    Frequency and Duration min 3x week  2 weeks   Pertinent Vitals/Pain NA   SLP Goals  SLP Goals Potential to Achieve Goals: Good Potential Considerations: Financial resources;Family/community support;Severity of impairments Progress/Goals/Alternative treatment plan discussed with pt/caregiver and they: Agree SLP Goal #1: Pt will initiate phonation with max cues during automatic speech task.  SLP Goal #1 - Progress: Progressing toward goal SLP Goal #2: Pt will tolerate PMSV placement during all waking hours with supervision.  SLP Goal #2 - Progress: Progressing toward goal SLP Goal #3: Pt will utilize thumbs up/down with max verbal cues to communicate basic wants needs.  SLP Goal #3 - Progress: Progressing toward goal SLP Goal #4: Pt will sustain attention to basic communicative task for 1 minute with max verbal cues.  SLP Goal #4 - Progress: Progressing toward goal  SLP Evaluation Prior Functioning  Cognitive/Linguistic Baseline: Within functional limits Type of Home: Apartment  Lives With: Family Available Help at Discharge: Available 24 hours/day   Cognition  Overall Cognitive Status: Impaired/Different from baseline Arousal/Alertness: Awake/alert Orientation Level: Oriented to person;Oriented to place Attention: Focused;Sustained Focused Attention: Impaired (intermittently focused) Focused Attention Impairment: Verbal complex;Functional complex Sustained Attention: Impaired Sustained Attention Impairment: Verbal basic;Functional basic Memory:  (UTA) Awareness:  Impaired Problem Solving: Impaired    Comprehension  Auditory Comprehension  Overall Auditory Comprehension: Impaired Yes/No Questions: Impaired Basic Biographical Questions: 76-100% accurate Basic Immediate Environment Questions: 75-100% accurate Other Yes/No Questions Comments`:  (inconsistent with subjective questions) Commands: Impaired One Step Basic Commands: 25-49% accurate Conversation: Simple Interfering Components: Attention;Visual impairments EffectiveTechniques: Repetition;Visual/Gestural cues Visual Recognition/Discrimination Discrimination: Not tested    Expression Verbal Expression Overall Verbal Expression: Other (comment) (UTA due to trach, poor tolerance of PMSV) Written Expression Dominant Hand: Right   Oral / Motor Oral Motor/Sensory Function Overall Oral Motor/Sensory Function: Impaired (does not follow commands, good movement of lips ) Motor Speech Overall Motor Speech:  (UTA due to trach) Phonation: Hoarse Intelligibility: Unable to assess (comment)   GO    Harlon Ditty, MA CCC-SLP [phone]  Sally Nelson 09/30/2012, 10:22 AM

## 2012-10-01 ENCOUNTER — Inpatient Hospital Stay (HOSPITAL_COMMUNITY): Payer: Medicaid Other

## 2012-10-01 LAB — CBC
HCT: 30 % — ABNORMAL LOW (ref 36.0–46.0)
Hemoglobin: 10.3 g/dL — ABNORMAL LOW (ref 12.0–15.0)
MCH: 28.4 pg (ref 26.0–34.0)
MCHC: 34.3 g/dL (ref 30.0–36.0)
MCV: 82.6 fL (ref 78.0–100.0)
RBC: 3.63 MIL/uL — ABNORMAL LOW (ref 3.87–5.11)
WBC: 11.2 10*3/uL — ABNORMAL HIGH (ref 4.0–10.5)

## 2012-10-01 LAB — GLUCOSE, CAPILLARY
Glucose-Capillary: 110 mg/dL — ABNORMAL HIGH (ref 70–99)
Glucose-Capillary: 114 mg/dL — ABNORMAL HIGH (ref 70–99)
Glucose-Capillary: 114 mg/dL — ABNORMAL HIGH (ref 70–99)
Glucose-Capillary: 126 mg/dL — ABNORMAL HIGH (ref 70–99)
Glucose-Capillary: 130 mg/dL — ABNORMAL HIGH (ref 70–99)
Glucose-Capillary: 133 mg/dL — ABNORMAL HIGH (ref 70–99)

## 2012-10-01 LAB — BASIC METABOLIC PANEL
BUN: 17 mg/dL (ref 6–23)
CO2: 32 mEq/L (ref 19–32)
Calcium: 9.2 mg/dL (ref 8.4–10.5)
Creatinine, Ser: 0.31 mg/dL — ABNORMAL LOW (ref 0.50–1.10)
GFR calc non Af Amer: >90 mL/min (ref >90)
Glucose, Bld: 123 mg/dL — ABNORMAL HIGH (ref 70–99)
Potassium: 3.1 mEq/L — ABNORMAL LOW (ref 3.5–5.1)
Sodium: 138 mEq/L (ref 135–145)

## 2012-10-01 MED ORDER — POTASSIUM CHLORIDE 20 MEQ/15ML (10%) PO LIQD
40.0000 meq | Freq: Three times a day (TID) | ORAL | Status: AC
  Administered 2012-10-01 (×2): 40 meq
  Filled 2012-10-01 (×2): qty 30

## 2012-10-01 MED ORDER — POTASSIUM CHLORIDE 10 MEQ/100ML IV SOLN
10.0000 meq | INTRAVENOUS | Status: AC
  Administered 2012-10-01 (×4): 10 meq via INTRAVENOUS
  Filled 2012-10-01 (×4): qty 100

## 2012-10-01 NOTE — Progress Notes (Signed)
At 1105 the patient became more difficult to arouse and unable to follow commands. The patient would no longer move her RUE. Pupils size increased to size 5 and were more sluggish. CT already ordered for this morning. Notified Dr. Patric Dykes; no new orders received. At 1120. reassessed pt and she was more alert and is following commands. Pupils 5 sluggish. Will get CT and continue to monitor.

## 2012-10-01 NOTE — Progress Notes (Signed)
Dr. Conchita Paris viewed CT results; telephone order to raise IVC to . Will continue to monitor.

## 2012-10-01 NOTE — Progress Notes (Signed)
**Note De-Identified Adam Sanjuan Obfuscation** RT note: Sutures removed 8 days post trach insertion. Site cleansed, dressing applied to chin, break down noted. Pt tolerated well.

## 2012-10-01 NOTE — Progress Notes (Signed)
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  LINES / TUBES: 9/12 ETT >>9/23 9/12 Lt [place] >> 9/23 PEG tube >>> 9/23 Tracheostomy (jy) >>>  CULTURES: 9/12 respiratory culture >>neg 9/12 bld >> neg 9/14 C-diff pcr>>>neg 9/18 BCx2>>>neg 9/17 UC>>>neg Blood 9/22>>>NTD Urine 9/22>>>E. Coli sensitive to zosyn. 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 Following commands.  No events overnight.  Spoke with father extensively today, informed him that the patient will likely need trach but will need to speak with NS prior to proceeding further with either extubation or trach/peg to determine prognosis.  Father is ok with whatever recommendations we make. 9/20 - Waxing and waning mental status. More drowsy. Doing SBT but not extubation candidate 9/23- PEG Tube, Tracheostomy 9/23- Decreased LOC, hypotensive, increased leukocytosis, decreased Hgb. No new acute Hem or HCP on CT. Pt was given 3U PRBC and Levophed.   SUBJECTIVE/OVERNIGHT/INTERVAL HX:   No events overnight.  Vent overnight.  VITAL SIGNS: Temp:  [97.8 F (36.6 C)-98.7 F (37.1 C)] 97.8 F (36.6 C) (10/01 0800) Pulse Rate:  [64-103] 64 (10/01 1015) Resp:  [8-30] 30 (10/01 1015) BP: (115-135)/(62-92) 120/77 mmHg (10/01 1000) SpO2:  [98 %-100 %] 99 % (10/01 1015) FiO2  (%):  [28 %] 28 % (10/01 1000) Weight:  [56.8 kg (125 lb 3.5 oz)] 56.8 kg (125 lb 3.5 oz) (10/01 0500)  VENTILATOR SETTINGS: Vent Mode:  [-]  FiO2 (%):  [28 %] 28 %  INTAKE / OUTPUT: Intake/Output     09/30 0701 - 10/01 0700 10/01 0701 - 10/02 0700   I.V. (mL/kg) 2400 (42.3) 300 (5.3)   Other 700    NG/GT 1440 180   IV Piggyback 350 200   Total Intake(mL/kg) 4890 (86.1) 680 (12)   Urine (mL/kg/hr) 3605 (2.6) 905 (4.6)   Drains     Stool 750 (0.6)    Total Output 4355 905   Net +535 -225         PHYSICAL EXAMINATION: Gen: young female, NAD on vent  HEENT: scalp dressing in place, drain in place, ETT in place PULM: resps even non labored on TC CV: s1 s2 RRR, loud diastolic/systolic murmur, no JVD AB: BS+, soft, nontender, no hsm, PEG tube in place Ext: warm, no edema, no clubbing, no cyanosis Derm: no rash or skin breakdown Neuro: eyes open, no response to verbal command, occ follow commands  LABS: PULMONARY  Recent Labs Lab 09/24/12 1420  PHART 7.419  PCO2ART 28.6*  PO2ART 132.0*  HCO3 18.2*  TCO2 19.0  O2SAT 99.4   CBC  Recent Labs Lab 09/28/12 0256 09/29/12 0500 10/01/12 0500  HGB 9.8* 9.7* 10.3*  HCT 28.2* 28.5* 30.0*  WBC 12.1* 9.9 11.2*  PLT 153 151 173   CHEMISTRY  Recent Labs Lab 09/27/12 0500 09/28/12 0256 09/29/12 0500 09/30/12 0335 09/30/12 0741 10/01/12 0500  NA  133* 139 137  --  137 138  K 3.5 3.2* 3.3*  --  3.8 3.1*  CL 100 103 100  --  100 99  CO2 22 27 28   --  30 32  GLUCOSE 169* 130* 106*  --  125* 123*  BUN 13 11 13   --  17 17  CREATININE 0.36* 0.35* 0.34*  --  0.38* 0.31*  CALCIUM 8.9 9.0 8.9  --  9.0 9.2  MG 1.9 1.8 1.7 2.2  --  2.0  PHOS 3.1 3.0 3.7 4.0  --  3.7   Estimated Creatinine Clearance: 81.8 ml/min (by C-G formula based on Cr of 0.31).  ENDOCRINE CBG (last 3)   Recent Labs  10/01/12 0010 10/01/12 0353 10/01/12 0823  GLUCAP 130* 110* 114*    IMAGING x48h  Ct Head Wo Contrast  09/30/2012   CLINICAL  DATA:  Clamped extraventricular drainage. Previous subarachnoid hemorrhage with hydrocephalus.  EXAM: CT HEAD WITHOUT CONTRAST  TECHNIQUE: Contiguous axial images were obtained from the base of the skull through the vertex without intravenous contrast.  COMPARISON:  Multiple priors, most recent 09/23/2012.  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 As compared with the prior CT of 09/23/2012, bifrontal ventricular diameter is 44 mm as compared with 32 mm previously. Right frontotemporal craniectomy with herniation of the edematous brain outside the cranial vault. Improving subarachnoid and intraventricular hemorrhage.  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 Right frontotemporal craniectomy with brain hernia, similar to prior.   Electronically Signed   By: Davonna Belling M.D.   On: 09/30/2012 10:39   ASSESSMENT / PLAN:  NEUROLOGIC A:   SAH, s/p R MCA clipping c/b R MCA territory CVA, edema and effacement P:   - Nsgy following ventric and post op, ventric clamped, head CT today and likely remove in AM. - Cont keppra. - HHH therapy per NS. - TCD per NS. - Recommend discontinuation of IVF at this point since Na is improving with florinef.  PULMONARY A:  Post op respiratory failure > s/p trach 9/23, on vent FiO2 30%, O2 sat 100% CAP vs aspiration pneumonia (favor aspiration) - 9/24 CXR, increased opacificationRLL Asthma - without exacerbation P:   - RLL infiltrate with elevation in WBC. - See ID section. - Maintain on TC as tolerated.  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 HTN - Echo with grade 2 diastolic dysfunction, thickening and EF of 60-65%. HHH, does  not autoregulate well with low MAP P:  - Tele. - D/Ced levophed.  RENAL A:   Hyponatremia, mild in setting brain edema.  CSW more likely given that urine Na is equal to Na intake (156 to 154 in saline).  Sodium stabilized with matching intake and input.  CSW more likely given that Na is not rising even with high Na intake. P:   - F/u chem in am. - Avoid free water, brain edema. - No lasix with HHH. - Continue fludrocortisone. - Replace K as needed.  GASTROINTESTINAL A:   Dysphagia - in setting of AMS / Resp fx No active bleeding noted P:   - TF as tolerated. - Pantoprazole - Imodium for diarrhea (C. Diff negative likely TF related).  HEMATOLOGIC A:   Anemia,  s/p 3U PRBC 9/24 > good response P:  - Monitor h/h - Transfusion threshold < 7gm/dl - SCD  INFECTIOUS A:   Aspiration pneumonia.  9/24 Febrile, chest Xray shows worsening of RLL opacification  UTI P:   - Recultured 9/22 as above. - CSF culture negative.   - D/C abx. - pCXR in am to re eval infiltrate rt.  ENDOCRINE A:   Single episode of hyperglycemia, may be transient due to steroids  P:   - Monitor glucose.  Na stable with fludrocortisone, BP improved with hydrocortisone stress dose but decreased dose as above, ventric to be clamped today, head CT ordered, if stable will likely DC ventric in AM.  Maintain on TC, monitor BP and decrease hydrocortisone to 25 mg q6 hours.  Alyson Reedy, M.D.  [facility] Pulmonary/Critical Care Medicine. Pager: [phone]. After hours pager: [phone].

## 2012-10-01 NOTE — Progress Notes (Addendum)
Pt seen and examined. No issues overnight. Neurologically stable per RN.  EXAM: Temp:  [97.8 F (36.6 C)-98.7 F (37.1 C)] 97.8 F (36.6 C) (10/01 0800) Pulse Rate:  [64-103] 75 (10/01 0800) Resp:  [5-23] 15 (10/01 0800) BP: (115-135)/(62-92) 132/85 mmHg (10/01 0800) SpO2:  [98 %-100 %] 100 % (10/01 0800) FiO2 (%):  [28 %] 28 % (10/01 0800) Weight:  [56.8 kg (125 lb 3.5 oz)] 56.8 kg (125 lb 3.5 oz) (10/01 0500) Intake/Output     09/30 0701 - 10/01 0700 10/01 0701 - 10/02 0700   I.V. (mL/kg) 2400 (42.3) 100 (1.8)   Other 700    NG/GT 1440 60   IV Piggyback 350 100   Total Intake(mL/kg) 4890 (86.1) 260 (4.6)   Urine (mL/kg/hr) 3605 (2.6) 255 (2.8)   Drains     Stool 750 (0.6)    Total Output 4355 255   Net +535 +5         EVD clamped Awake, alert Trached, on trach collar Follows commands briskly RUE/RLE, minimal movement LLE, LUE Wound c/d/i  LABS: Lab Results  Component Value Date   CREATININE 0.31* 10/01/2012   BUN 17 10/01/2012   NA 138 10/01/2012   K 3.1* 10/01/2012   CL 99 10/01/2012   CO2 32 10/01/2012   Lab Results  Component Value Date   WBC 11.2* 10/01/2012   HGB 10.3* 10/01/2012   HCT 30.0* 10/01/2012   MCV 82.6 10/01/2012   PLT 173 10/01/2012    IMAGING: CTH yesterday prior to EVD clamping reviewed. Demonstrates significant ventriculomegaly in comparison to prior CT of 9/23, however CT on 9/23 done while actively draining EVD.  IMPRESSION: - 27 y.o. female SAH d# 20 s/p RMCA clipping. - Neurologically stable after ~24hrs EVD clamping  PLAN: - Repeat CTH today to asses change in ventriculomegaly after 24hrs of clamp. - Transduce EVD while clamped - Cont PT/OT. Will consult PMR for IPR.

## 2012-10-01 NOTE — Progress Notes (Signed)
[facility] ADULT ICU REPLACEMENT PROTOCOL FOR AM LAB REPLACEMENT ONLY  The patient does apply for the [facility] Adult ICU Electrolyte Replacment Protocol based on the criteria listed below:   1. Is GFR >/= 40 ml/min? yes  Patient's GFR today is >90 2. Is urine output >/= 0.5 ml/kg/hr for the last 6 hours? yes Patient's UOP is 2.56 ml/kg/hr 3. Is BUN < 60 mg/dL? yes  Patient's BUN today is 17 4. Abnormal electrolyte(s): K+ 3.1 5. Ordered repletion with: see order 6. If Nelson panic level lab has been reported, has the CCM MD in charge been notified? yes.   Physician:  Dr. Stefan Church, Sally Nelson 10/01/2012 6:35 AM

## 2012-10-02 LAB — CBC
Hemoglobin: 10.5 g/dL — ABNORMAL LOW (ref 12.0–15.0)
MCH: 28.3 pg (ref 26.0–34.0)
MCHC: 34.1 g/dL (ref 30.0–36.0)
MCV: 83 fL (ref 78.0–100.0)
Platelets: 177 10*3/uL (ref 150–400)
RBC: 3.71 MIL/uL — ABNORMAL LOW (ref 3.87–5.11)

## 2012-10-02 LAB — BASIC METABOLIC PANEL
BUN: 16 mg/dL (ref 6–23)
CO2: 30 mEq/L (ref 19–32)
Calcium: 9 mg/dL (ref 8.4–10.5)
Creatinine, Ser: 0.33 mg/dL — ABNORMAL LOW (ref 0.50–1.10)
GFR calc non Af Amer: >90 mL/min (ref >90)
Glucose, Bld: 124 mg/dL — ABNORMAL HIGH (ref 70–99)

## 2012-10-02 LAB — GLUCOSE, CAPILLARY: Glucose-Capillary: 141 mg/dL — ABNORMAL HIGH (ref 70–99)

## 2012-10-02 LAB — MAGNESIUM: Magnesium: 2 mg/dL (ref 1.5–2.5)

## 2012-10-02 MED ORDER — OSMOLITE 1.2 CAL PO LIQD
1000.0000 mL | ORAL | Status: DC
  Administered 2012-10-02 – 2012-10-08 (×9): 1000 mL
  Filled 2012-10-02 (×16): qty 1000

## 2012-10-02 NOTE — Progress Notes (Signed)
Occupational Therapy Treatment Patient Details Name: Sally Nelson MRN: [id number] DOB: November 19, 1985 Today's Date: 10/02/2012 Time: 4098-1191 OT Time Calculation (min): 15 min  OT Assessment / Plan / Recommendation  History of present illness Pt with RMCA aneurysm clipping and crani 9/11.  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     OT comments  Pt seen for additional session to assist nsg staff with mobility. Rec staff use maxi move to lift pt. Pt gave thumbs up when asked about sitting up in chair. Pt attempted to help with bed mobility. Making progress.   Follow Up Recommendations  CIR    Barriers to Discharge       Equipment Recommendations  3 in 1 bedside comode;Tub/shower bench;Wheelchair (measurements OT);Wheelchair cushion (measurements OT);Hospital bed    Recommendations for Other Services Rehab consult  Frequency Min 3X/week   Progress towards OT Goals Progress towards OT goals: Progressing toward goals  Plan Discharge plan remains appropriate;Frequency needs to be updated    Precautions / Restrictions Precautions Precautions: Fall;Other (comment)   Pertinent Vitals/Pain no apparent distress     ADL  ADL Comments: Pt seen for second session to educate nsg on trasnfer technique. Pt total A for transfers.     OT Diagnosis:    OT Problem List:   OT Treatment Interventions:     OT Goals(current goals can now be found in the care plan section) Acute Rehab OT Goals Patient Stated Goal: unable to stae OT Goal Formulation: With family Time For Goal Achievement: 10/14/12 Potential to Achieve Goals: Good ADL Goals Pt Will Perform Grooming: with mod assist;sitting Additional ADL Goal #1: sit EOB with mod A in prep for ADL retraining Additional ADL Goal #2: follow 1 step commands during ADL task with minvc Additional ADL Goal #3: Demonstrate visual scanning to locate target in L visual  field with mod  cuing. Additional ADL Goal #4: Family to complete PROM LUE with min A  Visit Information  Last OT Received On: 10/02/12 Assistance Needed: +2 History of Present Illness: Pt with RMCA aneurysm clipping and crani 9/11.  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      Subjective Data      Prior Functioning       Cognition  Cognition General Comments: AFter sitting up this pm, pt giving thumbs up that she enjoyed being OOB and in chair.    Mobility  Bed Mobility Details for Bed Mobility Assistance: pt attempting to lift RLE to lift onto bed    Exercises      Balance     End of Session OT - End of Session Equipment Utilized During Treatment: Gait belt Activity Tolerance: Patient tolerated treatment well Patient left: in bed;with call bell/phone within reach;with nursing/sitter in room Nurse Communication: Mobility status;Need for lift equipment (rec staff use Maxi move to lift  pt OOB)  GO     Devin Foskey,[name] 10/02/2012, 6:45 PM [facility], OTR/L  [phone] 10/02/2012

## 2012-10-02 NOTE — Progress Notes (Signed)
NUTRITION FOLLOW UP  Intervention:    1. D/C Jevity  2. Osmolite 1.2 @ 60 ml/hr (lower osmolality)  2. 30 ml Prostat daily  Tube feeding regimen provides 1828 kcal, 95 grams of protein, and 1167 ml of H2O.    Nutrition Dx:   Inadequate oral intake related to inability to eat as evidenced by NPO status; ongoing.    Goal:  Intake to meet >90% of estimated nutrition needs, met.   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 Pt may need VP shunt. Pt unable to be extubated. Trach and PEG 9/23. Pt now on trach collar.  Pt has had continued diarrhea via flexiseal without improvement despite TF changes. Pt started on scheduled imodium 9/29. Pt discussed during ICU rounds and with RN.      Height: Ht Readings from Last 1 Encounters:  09/11/12 5\' 1"  (1.549 m)    Weight Status:   Wt Readings from Last 1 Encounters:  10/02/12 128 lb 15.5 oz (58.5 kg)  Admission weight: 118 lb 9/11  Re-estimated needs:  Kcal: 1700-1900 Protein: 80-100 grams  Fluid: >1.5 L/day  Skin: head and abd incision  Diet Order: NPO   Intake/Output Summary (Last 24 hours) at 10/02/12 0852 Last data filed at 10/02/12 0800  Gross per 24 hour  Intake   4220 ml  Output   4357 ml  Net   -137 ml    Last BM: via rectal pouch which was inserted 9/14 225 ml 10/1 750 ml 9/30 650 ml 9/29   Labs:   Recent Labs Lab 09/30/12 0335 09/30/12 0741 10/01/12 0500 10/02/12 0500  NA  --  137 138 139  K  --  3.8 3.1* 4.0  CL  --  100 99 101  CO2  --  30 32 30  BUN  --  17 17 16   CREATININE  --  0.38* 0.31* 0.33*  CALCIUM  --  9.0 9.2 9.0  MG 2.2  --  2.0 2.0  PHOS 4.0  --  3.7 4.1  GLUCOSE  --  125* 123* 124*    CBG (last 3)   Recent Labs  10/01/12 2008 10/02/12 0008 10/02/12 0345  GLUCAP 126* 117* 108*    Scheduled Meds: . antiseptic oral rinse  15 mL Mouth Rinse QID  . chlorhexidine   15 mL Mouth Rinse BID  . feeding supplement  30 mL Per Tube Daily  . fludrocortisone  0.1 mg Per Tube Daily  . heparin subcutaneous  5,000 Units Subcutaneous Q8H  . hydrocortisone sodium succinate  25 mg Intravenous Q6H  . insulin aspart  2-6 Units Subcutaneous Q4H  . levETIRAcetam  500 mg Per Tube Q12H  . loperamide  4 mg Per Tube BID  . NiMODipine  60 mg Per Tube Q4H   Or  . niMODipine  60 mg Oral Q4H  . oxybutynin  2.5 mg Per Tube BID  . pantoprazole sodium  40 mg Per Tube Q1200  . senna-docusate  1 tablet Oral BID  . simvastatin  80 mg Oral QHS    Continuous Infusions: . sodium chloride 100 mL/hr at 10/02/12 0700  . sodium chloride Stopped (09/30/12 2000)  . feeding supplement (JEVITY 1.2 CAL) 1,000 mL (10/02/12 0700)  . norepinephrine (LEVOPHED) Adult infusion Stopped (09/27/12 1513)    Kendell Bane RD, LDN, CNSC [phone] Pager [phone] After Hours Pager

## 2012-10-02 NOTE — Progress Notes (Signed)
UR completed.  Cyana Shook, RN BSN MHA CCM Trauma/Neuro ICU Case Manager [phone]  

## 2012-10-02 NOTE — Progress Notes (Signed)
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     PT Comments   Pt admitted with above. Pt currently with functional limitations due to postural and balance deficits as well as hemibody weakness.  Pt will benefit from skilled PT to increase their independence and safety with mobility to allow discharge to the venue listed below.   Follow Up Recommendations  CIR;Supervision/Assistance - 24 hour                 Equipment Recommendations  Other (comment) (TBA)    Recommendations for Other Services Rehab consult  Frequency Min 3X/week   Progress towards PT Goals Progress towards PT goals: Progressing toward goals  Plan Current plan remains appropriate    Precautions / Restrictions Precautions Precautions: Fall;Other (comment) (ventric drain) Restrictions Weight Bearing Restrictions: No   Pertinent Vitals/Pain VSS, no pain    Mobility  Bed Mobility Bed Mobility: Supine to Sit;Sitting - Scoot to Edge of Bed Supine to Sit: 1: +2 Total assist;HOB flat Supine to Sit: Patient Percentage: 0% Sitting - Scoot to Edge of Bed: 1: +1 Total assist Transfers Sit to Stand: 1: +2 Total assist;From bed Sit to Stand: Patient Percentage: 10% Stand to Sit: 1: +1 Total assist Squat Pivot Transfers: 1: +1 Total assist Details for Transfer Assistance: transfer to strong side Ambulation/Gait Ambulation/Gait Assistance: Not tested (comment) Stairs: No Wheelchair Mobility Wheelchair Mobility: No Modified Rankin (Stroke Patients Only) Pre-Morbid Rankin Score: No significant disability Modified Rankin: Severe disability    Exercises General Exercises -  Lower Extremity Long Arc Quad: AAROM;Right;5 reps;Seated Other Exercises Other Exercises: RUE A/AAROM Other Exercises: LUE PROM Other Exercises: cervical neck strengthening Other Exercises: Noted movement in left LE - trace movement   PT Goals (current goals can now be found in the care plan section) Acute Rehab PT Goals Patient Stated Goal: unable to stae  Visit Information  Last PT Received On: 10/02/12 Assistance Needed: +2 PT/OT Co-Evaluation/Treatment: Yes History of Present Illness: 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      Subjective Data  Subjective: No verbalizations, trach in place Patient Stated Goal: unable to stae   Cognition  Cognition Arousal/Alertness: Awake/alert Behavior During Therapy: Flat affect Overall Cognitive Status: Impaired/Different from baseline Area of Impairment: Following commands Following Commands: Follows one step commands consistently General Comments: more responsive. blowing her father a kiss. beginning to shake head    Balance  Balance Balance Assessed: Yes Static Sitting Balance Static Sitting - Balance Support: Feet supported;Right upper extremity supported Static Sitting - Level of Assistance: 2: Max assist;Other (comment) (pt beginnning to demonstrate righting reations. holding head) Static Sitting - Comment/# of Minutes: 15 min  End of Session PT - End of Session Equipment Utilized During Treatment: Oxygen (trach) Activity Tolerance: Patient limited by fatigue Patient left: with call bell/phone within reach;with restraints reapplied;in chair;with family/visitor present Nurse Communication: Mobility status;Need for lift equipment        INGOLD,Kaien Pezzullo 10/02/2012, 1:05 PM [facility] Acute Rehabilitation [phone] (pager)

## 2012-10-02 NOTE — Progress Notes (Signed)
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 On 9/12, developed R SDH with R MCA CVA with need for right decompressive hemicraniectomy. VDRF with trach and PEG.     OT comments   Pt making excellent progress. Transferred pt to chair today. Pt with improved posture sitting EOB. Swinging R leg on EOB. Once in recliner, pt able to bring head and maintain in midline for brief periods. Moved L LE on command and demonstrated L ankle dorsiflexion. Giving thumbs up for yes, blowing her Dad a kis, wiping mouth and self suctioning. Demonstrates R gaze preference, however, able to bring eyes to midline. Will continue to follow. Continue to recommend CIR. Father present for session. Educated father on PROM LUE and incorporating RUE in functional tasks. Father very Adult nurse.    Follow Up Recommendations  CIR    Barriers to Discharge       Equipment Recommendations  3 in 1 bedside comode;Tub/shower bench;Wheelchair (measurements OT);Wheelchair cushion (measurements OT);Hospital bed    Recommendations for Other Services Rehab consult  Frequency Min 3X/week   Progress towards OT Goals Progress towards OT goals: Progressing toward goals  Plan Discharge plan remains appropriate;Frequency needs to be updated    Precautions / Restrictions Precautions Precautions: Fall;Other (comment) (ventric drain)   Pertinent Vitals/Pain Vitals stable throughout.    ADL  Grooming: Other (comment);Wash/dry face;Wash/dry hands;Teeth care;Moderate assistance (assisted with wiping mouth, suction) Equipment Used: Gait belt Transfers/Ambulation Related to ADLs: +2 total A. Pt weight bearing through RLE during transfer    OT Diagnosis:    OT Problem  List:   OT Treatment Interventions:     OT Goals(current goals can now be found in the care plan section) Acute Rehab OT Goals Patient Stated Goal: unable to stae OT Goal Formulation: With family Time For Goal Achievement: 10/14/12 Potential to Achieve Goals: Good ADL Goals Pt Will Perform Grooming: with mod assist;sitting Additional ADL Goal #1: sit EOB with mod A in prep for ADL retraining Additional ADL Goal #2: follow 1 step commands during ADL task with minvc Additional ADL Goal #3: Demonstrate visual scanning to locate target in L visual  field with mod cuing. Additional ADL Goal #4: Family to complete PROM LUE with min A  Visit Information  Last OT Received On: 10/02/12 Assistance Needed: +2 PT/OT Co-Evaluation/Treatment: Yes    Subjective Data      Prior Functioning       Cognition  Cognition Arousal/Alertness: Awake/alert Behavior During Therapy: Flat affect Overall Cognitive Status: Impaired/Different from baseline Area of Impairment: Following commands Following Commands: Follows one step commands consistently General Comments: more responsive. blowing her father a kiss. beginning to shake head    Mobility  Bed Mobility Bed Mobility: Supine to Sit;Sitting - Scoot to Edge of Bed Supine to Sit: 1: +2 Total assist;HOB flat Supine to Sit: Patient Percentage: 0% Sitting - Scoot to Edge of Bed: 1: +1 Total assist Transfers Transfers: Sit to Stand;Stand to Sit Sit to Stand: 1: +2 Total assist;From bed Sit to Stand: Patient Percentage: 10% Stand to Sit: 1: +1 Total assist Details for Transfer Assistance: transfer to strong side    Exercises  Other Exercises Other Exercises: RUE A/AAROM Other Exercises: LUE PROM Other Exercises: cervical neck strengthening   Balance Balance Balance  Assessed: Yes Static Sitting Balance Static Sitting - Balance Support: Feet supported;Right upper extremity supported Static Sitting - Level of Assistance: 2: Max assist;Other  (comment) (pt beginnning to demonstrate righting reations. holding head)   End of Session OT - End of Session Equipment Utilized During Treatment: Gait belt;Oxygen Activity Tolerance: Patient tolerated treatment well Patient left: in chair;with call bell/phone within reach;with family/visitor present Nurse Communication: Mobility status;Other (comment) (progress)  GO     Sally Nelson,[name] 10/02/2012, 10:09 AM Luisa Dago, OTR/L  [phone] 10/02/2012

## 2012-10-02 NOTE — Progress Notes (Signed)
Pt seen and examined. No issues overnight. Pt remained stable during EVD clamping.  EXAM: Temp:  [97.1 F (36.2 C)-98.6 F (37 C)] 98.5 F (36.9 C) (10/02 1200) Pulse Rate:  [58-94] 78 (10/02 1300) Resp:  [13-29] 14 (10/02 1300) BP: (113-137)/(64-94) 125/78 mmHg (10/02 1300) SpO2:  [96 %-100 %] 100 % (10/02 1300) FiO2 (%):  [28 %] 28 % (10/02 1216) Weight:  [58.5 kg (128 lb 15.5 oz)] 58.5 kg (128 lb 15.5 oz) (10/02 0458) Intake/Output     10/01 0701 - 10/02 0700 10/02 0701 - 10/03 0700   I.V. (mL/kg) 2400 (41) 600 (10.3)   Other 180 60   NG/GT 1440 441   IV Piggyback 300    Total Intake(mL/kg) 4320 (73.8) 1101 (18.8)   Urine (mL/kg/hr) 4180 (3) 620 (1.4)   Drains 22 (0) 68 (0.2)   Stool 225 (0.2)    Total Output 4427 688   Net -107 +413         EVD in place, clamped Easily arousable Trached, on collar Follows commands RUE/RLE. Wound c/d/i  LABS: Lab Results  Component Value Date   CREATININE 0.33* 10/02/2012   BUN 16 10/02/2012   NA 139 10/02/2012   K 4.0 10/02/2012   CL 101 10/02/2012   CO2 30 10/02/2012   Lab Results  Component Value Date   WBC 11.1* 10/02/2012   HGB 10.5* 10/02/2012   HCT 30.8* 10/02/2012   MCV 83.0 10/02/2012   PLT 177 10/02/2012    IMAGING: Repeat CTH after 24hrs EVD clamped demonstrates marked increase in ventriculomegaly with transependymal flow.  IMPRESSION: - 27 y.o. female SAH d# 21 s/p RMCA clipping - Likely will be shunt dependent  PLAN: - Open drain at - D/C nimotop today - Will check CSF protein, cell counts. Plan on VPS early next week.

## 2012-10-02 NOTE — Progress Notes (Signed)
PULMONARY  / CRITICAL CARE MEDICINE  Name: Izzabella Besse MRN: [id number] DOB: 02-22-1985    ADMISSION DATE:  09/11/2012 CONSULTATION DATE:  09/11/2012  REFERRING MD :  Conchita Paris PRIMARY SERVICE: PCCM  CHIEF COMPLAINT:  Post op vent management  BRIEF PATIENT DESCRIPTION: 27 y/o female underwent a craniotomy, clipping R MCA aneurysm for a SAH on 9/12 and PCCM was consulted for post op vent management.  LINES / TUBES: 9/12 ETT >>9/23 9/12 Lt [place] >> 9/23 PEG tube >>> 9/23 Tracheostomy (jy) >>>  CULTURES: 9/12 respiratory culture >>neg 9/12 bld >> neg 9/14 C-diff pcr>>>neg 9/18 BCx2>>>neg 9/17 UC>>>neg Blood 9/22>>>NTD Urine 9/22>>>E. Coli sensitive to zosyn. 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 Following commands.  No events overnight.  Spoke with father extensively today, informed him that the patient will likely need trach but will need to speak with NS prior to proceeding further with either extubation or trach/peg to determine prognosis.  Father is ok with whatever recommendations we make. 9/20 - Waxing and waning mental status. More drowsy. Doing SBT but not extubation candidate 9/23- PEG Tube, Tracheostomy 9/23- Decreased LOC, hypotensive, increased leukocytosis, decreased Hgb. No new acute Hem or HCP on CT. Pt was given 3U PRBC and Levophed.   SUBJECTIVE/OVERNIGHT/INTERVAL HX:   No events overnight.  Vent overnight.  VITAL SIGNS: Temp:  [97.1 F (36.2 C)-98.6 F (37 C)] 98 F (36.7 C) (10/02 0740) Pulse Rate:  [58-96] 59 (10/02 0740) Resp:  [13-30] 16 (10/02 0740) BP: (113-137)/(63-94) 113/75 mmHg (10/02 0740) SpO2:  [96 %-100 %] 100 % (10/02 0740) FiO2  (%):  [28 %] 28 % (10/02 0800) Weight:  [58.5 kg (128 lb 15.5 oz)] 58.5 kg (128 lb 15.5 oz) (10/02 0458)  VENTILATOR SETTINGS: Vent Mode:  [-]  FiO2 (%):  [28 %] 28 %  INTAKE / OUTPUT: Intake/Output     10/01 0701 - 10/02 0700 10/02 0701 - 10/03 0700   I.V. (mL/kg) 2400 (41) 200 (3.4)   Other 180    NG/GT 1440 120   IV Piggyback 300    Total Intake(mL/kg) 4320 (73.8) 320 (5.5)   Urine (mL/kg/hr) 4180 (3) 185 (1.3)   Drains 22 (0) 17 (0.1)   Stool 225 (0.2)    Total Output 4427 202   Net -107 +118         PHYSICAL EXAMINATION: Gen: young female, NAD on vent  HEENT: scalp dressing in place, drain in place, ETT in place PULM: resps even non labored on TC CV: s1 s2 RRR, loud diastolic/systolic murmur, no JVD AB: BS+, soft, nontender, no hsm, PEG tube in place Ext: warm, no edema, no clubbing, no cyanosis Derm: no rash or skin breakdown Neuro: eyes open, no response to verbal command, occ follow commands  LABS: PULMONARY No results found for this basename: PHART, PCO2, PCO2ART, PO2, PO2ART, HCO3, TCO2, O2SAT,  in the last 168 hours CBC  Recent Labs Lab 09/29/12 0500 10/01/12 0500 10/02/12 0500  HGB 9.7* 10.3* 10.5*  HCT 28.5* 30.0* 30.8*  WBC 9.9 11.2* 11.1*  PLT 151 173 177   CHEMISTRY  Recent Labs Lab 09/28/12 0256 09/29/12 0500 09/30/12 0335 09/30/12 0741 10/01/12 0500 10/02/12 0500  NA 139 137  --  137 138 139  K 3.2* 3.3*  --  3.8 3.1* 4.0  CL 103 100  --  100 99 101  CO2 27 28  --  30 32 30  GLUCOSE 130* 106*  --  125* 123* 124*  BUN 11 13  --  17 17 16   CREATININE 0.35* 0.34*  --  0.38* 0.31* 0.33*  CALCIUM 9.0 8.9  --  9.0 9.2 9.0  MG 1.8 1.7 2.2  --  2.0 2.0  PHOS 3.0 3.7 4.0  --  3.7 4.1   Estimated Creatinine Clearance: 89.2 ml/min (by C-G formula based on Cr of 0.33).  ENDOCRINE CBG (last 3)   Recent Labs  10/02/12 0008 10/02/12 0345 10/02/12 0809  GLUCAP 117* 108* 144*    IMAGING x48h  Ct Head Wo Contrast  10/01/2012    CLINICAL DATA:  FOLLOW UP diffuse brain edema and hydrocephalus. Right MCA branch vessel aneurysm.  EXAM: CT HEAD WITHOUT CONTRAST  TECHNIQUE: Contiguous axial images were obtained from the base of the skull through the vertex without intravenous contrast.  COMPARISON:  CT head without contrast 09/30/2012 and cerebral angiogram 09/11/2012.  FINDINGS: The patient is status post clipping of a right MCA territory aneurysm. The patient has undergone a right frontal craniectomy. There is herniation of brain parenchyma from the right frontal and temporal lobes into the craniectomy defect. Severe hydrocephalus continues to progress despite a left frontal ventriculostomy tube in place. Measurement across the frontal horns is now 50 mm compared with 44 mm previously. There is some layering blood within the lateral ventricles bilaterally. The 3rd ventricle is dilated. The midline is slightly shifted to the right. Edematous changes within the herniated parenchyma are compatible with infarct.  No new hemorrhage is present. Left hemisphere is unremarkable. The cerebellum is intact.  The paranasal sinuses and mastoid air cells are clear.  IMPRESSION: 1. Increasing hydrocephalus despite a left ventriculostomy tube in place. 2. 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 3. Changes compatible with infarct within the herniated parenchyma. 4. 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 5. Mild layering intraventricular hemorrhage.  Critical Value/emergent results were called by telephone at the time of interpretation on 10/01/2012 at 2:27 PM to Dr.NEELESH [facility] , who verbally acknowledged these results.   Electronically Signed   By: Gennette Pac   On: 10/01/2012 14:15   Ct Head Wo Contrast  09/30/2012   CLINICAL DATA:  Clamped extraventricular drainage. Previous subarachnoid hemorrhage with hydrocephalus.  EXAM: CT HEAD WITHOUT  CONTRAST  TECHNIQUE: Contiguous axial images were obtained from the base of the skull through the vertex without intravenous contrast.  COMPARISON:  Multiple priors, most recent 09/23/2012.  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 As compared with the prior CT of 09/23/2012, bifrontal ventricular diameter is 44 mm as compared with 32 mm previously. Right frontotemporal craniectomy with herniation of the edematous brain outside the cranial vault. Improving subarachnoid and intraventricular hemorrhage.  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 Right frontotemporal craniectomy with brain hernia, similar to prior.   Electronically Signed   By: Davonna Belling M.D.   On: 09/30/2012 10:39   ASSESSMENT / PLAN:  NEUROLOGIC A:   SAH, s/p R MCA clipping c/b R MCA  territory CVA, edema and effacement P:   - Nsgy following ventric and post op, ventric clamped, head CT worsened, will likely need a VP shunt. - Cont keppra.  PULMONARY A:  Post op respiratory failure > s/p trach 9/23, on vent FiO2 30%, O2 sat 100% CAP vs aspiration pneumonia (favor aspiration) - 9/24 CXR, increased opacificationRLL Asthma - without exacerbation P:   - RLL infiltrate with elevation in WBC. - See ID section. - Maintain on TC as tolerated.  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 HTN - Echo with grade 2 diastolic dysfunction, thickening and EF of 60-65%. 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 - D/Ced levophed.  RENAL A:   Hyponatremia, mild in setting brain edema.  CSW more likely given that urine Na is equal to Na intake (156 to 154 in saline).  Sodium stabilized with  matching intake and input.  CSW more likely given that Na is not rising even with high Na intake. P:   - F/u chem in am. - Avoid free water, brain edema. - No lasix with HHH. - Continue fludrocortisone, will likely decrease once off IVF and Na remains stable. - Replace K as needed.  GASTROINTESTINAL A:   Dysphagia - in setting of AMS / Resp fx No active bleeding noted P:   - TF as tolerated. - Pantoprazole - Imodium for diarrhea (C. Diff negative likely TF related).  HEMATOLOGIC A:   Anemia, s/p 3U PRBC 9/24 > good response P:  - Monitor h/h - Transfusion threshold < 7gm/dl - SCD  INFECTIOUS A:   Aspiration pneumonia.  9/24 Febrile, chest Xray shows worsening of RLL opacification  UTI P:   - Recultured 9/22 as above. - CSF culture negative.   - D/C abx.  ENDOCRINE A:   Single episode of hyperglycemia, may be transient due to steroids  P:   - Monitor glucose.  Na stable with fludrocortisone, BP improved with hydrocortisone stress dose and decreased dose as above, if BP remains stable after fluid is off will d/c stress dose steroids, if Na remains stable after fluid is off then will d/c fludrocortisone, continue to monitor in ICU until VP shunt is in place.  Alyson Reedy, M.D. [facility] Pulmonary/Critical Care Medicine. Pager: [phone]. After hours pager: [phone].

## 2012-10-03 LAB — BASIC METABOLIC PANEL
BUN: 22 mg/dL (ref 6–23)
CO2: 31 mEq/L (ref 19–32)
Calcium: 9 mg/dL (ref 8.4–10.5)
Chloride: 100 mEq/L (ref 96–112)
Creatinine, Ser: 0.33 mg/dL — ABNORMAL LOW (ref 0.50–1.10)
GFR calc Af Amer: >90 mL/min (ref >90)
Glucose, Bld: 116 mg/dL — ABNORMAL HIGH (ref 70–99)
Potassium: 3.8 mEq/L (ref 3.5–5.1)
Sodium: 137 mEq/L (ref 135–145)

## 2012-10-03 LAB — GLUCOSE, CAPILLARY
Glucose-Capillary: 104 mg/dL — ABNORMAL HIGH (ref 70–99)
Glucose-Capillary: 110 mg/dL — ABNORMAL HIGH (ref 70–99)
Glucose-Capillary: 123 mg/dL — ABNORMAL HIGH (ref 70–99)
Glucose-Capillary: 90 mg/dL (ref 70–99)

## 2012-10-03 LAB — PROTEIN AND GLUCOSE, CSF: Glucose, CSF: 69 mg/dL (ref 43–76)

## 2012-10-03 LAB — CBC
HCT: 28.6 % — ABNORMAL LOW (ref 36.0–46.0)
Hemoglobin: 9.8 g/dL — ABNORMAL LOW (ref 12.0–15.0)
RBC: 3.47 MIL/uL — ABNORMAL LOW (ref 3.87–5.11)
WBC: 9.1 10*3/uL (ref 4.0–10.5)

## 2012-10-03 LAB — MAGNESIUM: Magnesium: 2.1 mg/dL (ref 1.5–2.5)

## 2012-10-03 MED ORDER — ACETAMINOPHEN 160 MG/5ML PO SOLN
650.0000 mg | Freq: Four times a day (QID) | ORAL | Status: DC | PRN
  Administered 2012-10-15 – 2012-10-24 (×16): 650 mg
  Filled 2012-10-03 (×16): qty 20.3

## 2012-10-03 MED ORDER — POTASSIUM CHLORIDE 20 MEQ/15ML (10%) PO LIQD
40.0000 meq | Freq: Three times a day (TID) | ORAL | Status: AC
  Administered 2012-10-03 (×2): 40 meq
  Filled 2012-10-03 (×2): qty 30

## 2012-10-03 NOTE — Progress Notes (Signed)
PULMONARY  / CRITICAL CARE MEDICINE  Name: Sally Nelson MRN: [id number] DOB: 06-11-1985    ADMISSION DATE:  09/11/2012 CONSULTATION DATE:  09/11/2012  REFERRING MD :  Neurosurgery PRIMARY SERVICE: PCCM  CHIEF COMPLAINT:  Post op vent management  BRIEF PATIENT DESCRIPTION: 27 y/o with SAH s/p craniotomy and clipping of R MCA aneurysm.  SIGNIFICANT EVENTS / STUDIES:  9/12 OR >>> Craniotomy, clipping R MCA aneurysm for a [facility] 9/12 Head CT >>> Interval development R SDH, effacement basilar cisterns, evolving R MCA CVA, midline shift 14mm 9/23 PEG Tube, tracheostomy placement  LINES / TUBES: ETT 9/12 >>9/23 L [place]  9/12 >>> PEG 9/23 >>> Tracheostomy (jy) 9/23 >>> Ventriculostomy 9/19 >>>  CULTURES: 9/12 Respirstory >>> neg 9/12 Blood >>> neg 9/17 Urine >>> neg 9/18 Blood >>> neg Blood 9/22 >>> Urine 9/22 >>> E. Coli sensitive to Zosyn CSF 9/22 >>> neg Respiratory 9/22 >>>  ANTIBIOTICS: Unasyn 9/12 >>> 9/20 Vancomycin 9/22 >>> 9/29 Zosyn 9/22 >>> 9/29  SUBJECTIVE:  No overnight events.  VITAL SIGNS: Temp:  [97.4 F (36.3 C)-98.4 F (36.9 C)] 98.2 F (36.8 C) (10/04 0745) Pulse Rate:  [56-75] 64 (10/04 0804) Resp:  [0-26] 13 (10/04 0804) BP: (112-142)/(67-99) 125/80 mmHg (10/04 0804) SpO2:  [98 %-100 %] 100 % (10/04 0804) FiO2 (%):  [28 %] 28 % (10/04 0804) Weight:  [57.8 kg (127 lb 6.8 oz)] 57.8 kg (127 lb 6.8 oz) (10/04 0500)  VENTILATOR SETTINGS: Vent Mode:  [-]  FiO2 (%):  [28 %] 28 %  INTAKE / OUTPUT: Intake/Output     10/03 0701 - 10/04 0700 10/04 0701 - 10/05 0700   I.V. (mL/kg) 2400 (41.5) 100 (1.7)   Other     NG/GT 1260 60   Total Intake(mL/kg) 3660 (63.3) 160 (2.8)   Urine (mL/kg/hr) 1515 (1.1)    Drains 240 (0.2) 50 (0.5)   Stool     Total Output 1755 50   Net +1905 +110         PHYSICAL EXAMINATION: Gen: No acute respiratory distress HEENT: Ventriculostomy in place PULM: Tracheostomy, bilateral scattered rhonchi CV: Regular, systolic  / diastolic murmur AB: soft abdomen, bowel sounds present, PEG site intact Ext: No edema Derm: Skin intact Neuro: Non-verbal follows commands   Recent Labs Lab 09/29/12 0500 09/30/12 0335 09/30/12 0741 10/01/12 0500 10/02/12 0500 10/03/12 0500 10/04/12 0429  HGB 9.7*  --   --  10.3* 10.5* 9.8* 9.7*  WBC 9.9  --   --  11.2* 11.1* 9.1 8.8  PLT 151  --   --  173 177 163 146*  NA 137  --  137 138 139 137 142  K 3.3*  --  3.8 3.1* 4.0 3.8 4.1  CL 100  --  100 99 101 100 104  CO2 28  --  30 32 30 31 30   GLUCOSE 106*  --  125* 123* 124* 116* 125*  BUN 13  --  17 17 16 22 20   CREATININE 0.34*  --  0.38* 0.31* 0.33* 0.33* 0.33*  CALCIUM 8.9  --  9.0 9.2 9.0 9.0 8.9  MG 1.7 2.2  --  2.0 2.0 2.1 1.9  PHOS 3.7 4.0  --  3.7 4.1 3.9 3.7    Recent Labs Lab 10/03/12 0350 10/03/12 0824 10/03/12 1157 10/03/12 1533 10/04/12 0722  GLUCAP 104* 123* 90 123* 140*   CXR:  ASSESSMENT / PLAN:  PULMONARY A:  Post op respiratory failure. S/p tracheostomy.  Suspected aspiration pneumonia.  Asthma, stable. 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  HTN.  Chronic diastolic heart failure. P:  Metoprolol PRN Zocor  RENAL A:  Suspected cerebral salt waisting, stabilized on Fludrocortisone. P:   Trend BMP NS@100  Avoid free water with cerebral edema Continue fludrocortisone  GASTROINTESTINAL A:  Dysphagia, sp PEG placement. P:   TF Protonix for GI Px  HEMATOLOGIC A:  Anemia. P:  Trend CBC Heparin for DVT Px  INFECTIOUS A:  Suspected aspiration pneumonia.  UTI. P:   Completed abx as above  ENDOCRINE A:   Steroid induced hyperglycemia, resolved.  Suspected adrenal insufficiency. P:   Glycemic Control Protocol, Phase 1 Decrease Hydrocortisone 25 q12h  NEUROLOGIC A:  SAH, s/p R MCA  clipping c/b R MCA territory CVA, edema and effacement. P:   Neurosurgery following VP shunt early next week Keppra  I have personally obtained history, examined patient, evaluated and interpreted laboratory and imaging results, reviewed medical records, formulated assessment / plan and placed orders.  Lonia Farber, MD Pulmonary and Critical Care Medicine [facility] Pager: [phone]  10/04/2012, 8:44 AM

## 2012-10-03 NOTE — Progress Notes (Signed)
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  Trach/peg placed, ventric in place needs VP shunt.  Weaning well.  LINES / TUBES: 9/12 ETT >>9/23 9/12 Lt [place] >> 9/23 PEG tube >>> 9/23 Tracheostomy (jy) >>> 9/19 Ventric>>>  CULTURES: 9/12 respiratory culture >>neg 9/12 bld >> neg 9/14 C-diff pcr>>>neg 9/18 BCx2>>>neg 9/17 UC>>>neg Blood 9/22>>>NTD Urine 9/22>>>E. Coli sensitive to zosyn. 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 Following commands.  No events overnight.  Spoke with father extensively today, informed him that the patient will likely need trach but will need to speak with NS prior to proceeding further with either extubation or trach/peg to determine prognosis.  Father is ok with whatever recommendations we make. 9/20 - Waxing and waning mental status. More drowsy. Doing SBT but not extubation candidate 9/23- PEG Tube, Tracheostomy 9/23- Decreased LOC, hypotensive, increased leukocytosis, decreased Hgb. No new acute Hem or HCP on CT. Pt was given 3U PRBC and Levophed.   SUBJECTIVE/OVERNIGHT/INTERVAL HX:   No events overnight.  Vent overnight.  VITAL SIGNS: Temp:  [97.9 F (36.6 C)-98.5 F (36.9 C)] 98.1 F (36.7 C) (10/03 0352) Pulse Rate:  [59-88] 67 (10/03 0900) Resp:  [6-26] 12 (10/03 0900) BP:  (118-144)/(71-101) 130/82 mmHg (10/03 0900) SpO2:  [98 %-100 %] 100 % (10/03 0900) FiO2 (%):  [28 %] 28 % (10/03 0800) Weight:  [58.4 kg (128 lb 12 oz)] 58.4 kg (128 lb 12 oz) (10/03 0500)  VENTILATOR SETTINGS: Vent Mode:  [-]  FiO2 (%):  [28 %] 28 %  INTAKE / OUTPUT: Intake/Output     10/02 0701 - 10/03 0700 10/03 0701 - 10/04 0700   I.V. (mL/kg) 2400 (41.1) 100 (1.7)   Other 60    NG/GT 1440    IV Piggyback     Total Intake(mL/kg) 3900 (66.8) 100 (1.7)   Urine (mL/kg/hr) 1450 (1) 175 (1)   Drains 275 (0.2) 41 (0.2)   Stool 150 (0.1)    Total Output 1875 216   Net +2025 -116         PHYSICAL EXAMINATION: Gen: young female, NAD on vent  HEENT: scalp dressing in place, drain in place, ETT in place PULM: resps even non labored on TC CV: s1 s2 RRR, loud diastolic/systolic murmur, no JVD AB: BS+, soft, nontender, no hsm, PEG tube in place Ext: warm, no edema, no clubbing, no cyanosis Derm: no rash or skin breakdown Neuro: eyes open, no response to verbal command, occ follow commands  LABS: PULMONARY No results found for this basename: PHART, PCO2, PCO2ART, PO2, PO2ART, HCO3, TCO2, O2SAT,  in the last 168 hours CBC  Recent Labs Lab 10/01/12 0500 10/02/12 0500 10/03/12 0500  HGB 10.3* 10.5* 9.8*  HCT 30.0* 30.8* 28.6*  WBC 11.2* 11.1* 9.1  PLT 173 177 163   CHEMISTRY  Recent Labs Lab 09/29/12 0500 09/30/12 0335  09/30/12 0741 10/01/12 0500 10/02/12 0500 10/03/12 0500  NA 137  --  137 138 139 137  K 3.3*  --  3.8 3.1* 4.0 3.8  CL 100  --  100 99 101 100  CO2 28  --  30 32 30 31  GLUCOSE 106*  --  125* 123* 124* 116*  BUN 13  --  17 17 16 22   CREATININE 0.34*  --  0.38* 0.31* 0.33* 0.33*  CALCIUM 8.9  --  9.0 9.2 9.0 9.0  MG 1.7 2.2  --  2.0 2.0 2.1  PHOS 3.7 4.0  --  3.7 4.1 3.9   Estimated Creatinine Clearance: 89 ml/min (by C-G formula based on Cr of 0.33).  ENDOCRINE CBG (last 3)   Recent Labs  10/03/12 0030 10/03/12 0350 10/03/12 0824   GLUCAP 110* 104* 123*    IMAGING x48h  Ct Head Wo Contrast  10/01/2012   CLINICAL DATA:  FOLLOW UP diffuse brain edema and hydrocephalus. Right MCA branch vessel aneurysm.  EXAM: CT HEAD WITHOUT CONTRAST  TECHNIQUE: Contiguous axial images were obtained from the base of the skull through the vertex without intravenous contrast.  COMPARISON:  CT head without contrast 09/30/2012 and cerebral angiogram 09/11/2012.  FINDINGS: The patient is status post clipping of a right MCA territory aneurysm. The patient has undergone a right frontal craniectomy. There is herniation of brain parenchyma from the right frontal and temporal lobes into the craniectomy defect. Severe hydrocephalus continues to progress despite a left frontal ventriculostomy tube in place. Measurement across the frontal horns is now 50 mm compared with 44 mm previously. There is some layering blood within the lateral ventricles bilaterally. The 3rd ventricle is dilated. The midline is slightly shifted to the right. Edematous changes within the herniated parenchyma are compatible with infarct.  No new hemorrhage is present. Left hemisphere is unremarkable. The cerebellum is intact.  The paranasal sinuses and mastoid air cells are clear.  IMPRESSION: 1. Increasing hydrocephalus despite a left ventriculostomy tube in place. 2. 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 3. Changes compatible with infarct within the herniated parenchyma. 4. 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 5. Mild layering intraventricular hemorrhage.  Critical Value/emergent results were called by telephone at the time of interpretation on 10/01/2012 at 2:27 PM to Dr.NEELESH [facility] , who verbally acknowledged these results.   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 - Cont keppra.  PULMONARY A:  Post op respiratory failure > s/p trach 9/23, on vent FiO2 30%, O2 sat 100% CAP vs aspiration pneumonia (favor aspiration) - 9/24 CXR, increased opacificationRLL Asthma - without exacerbation P:   - RLL infiltrate but WBC improved. - See ID section. - Maintain on TC as tolerated.  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 HTN - Echo with grade 2 diastolic dysfunction, thickening and EF of 60-65%. 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 - D/Ced levophed.  RENAL A:   Hyponatremia, mild in setting brain edema and improving with fludricortisone.  CSW more likely given that urine Na is  equal to Na intake (156 to 154 in saline).  Sodium stabilized with matching intake and input and fludricortisone.  CSW more likely given that Na is not rising even with high Na intake. P:   - F/u chem in am. - Avoid free water, brain edema. - No lasix with HHH. - Continue fludrocortisone, will likely d/c if repeat urine Na after normal saline infusion is complete is more stable. - Replace K as needed.  GASTROINTESTINAL A:   Dysphagia - in setting of AMS / Resp fx No active bleeding noted P:   - TF as tolerated. - Pantoprazole - Imodium for diarrhea (C. Diff negative likely TF related).  HEMATOLOGIC A:   Anemia, s/p 3U PRBC 9/24 > good response P:  - Monitor h/h - Transfusion threshold < 7gm/dl - SCD  INFECTIOUS A:   Aspiration pneumonia.  9/24 Febrile, chest Xray shows worsening of RLL opacification  UTI P:   - Recultured 9/22 as above. - CSF culture negative.   - D/C abx.  ENDOCRINE A:   Single episode of hyperglycemia, may be transient due to steroids  P:   - Monitor glucose.  Na stable with fludrocortisone, BP improved with hydrocortisone stress dose and  tolerated decreased dose, now HTN, will d/c hydrocortisone and monitor BP.  If Na remains stable after fluid is off then will d/c fludrocortisone, continue to monitor in ICU until VP shunt is in place on Monday or Tuesday depending on NS.  Alyson Reedy, M.D. [facility] Pulmonary/Critical Care Medicine. Pager: [phone]. After hours pager: [phone].

## 2012-10-03 NOTE — Progress Notes (Signed)
Pt seen and examined. No issues overnight.   EXAM: Temp:  [97.8 F (36.6 C)-98.3 F (36.8 C)] 98.1 F (36.7 C) (10/03 1158) Pulse Rate:  [59-81] 64 (10/03 1540) Resp:  [0-26] 15 (10/03 1540) BP: (116-144)/(67-101) 116/67 mmHg (10/03 1540) SpO2:  [98 %-100 %] 100 % (10/03 1541) FiO2 (%):  [28 %] 28 % (10/03 1541) Weight:  [58.4 kg (128 lb 12 oz)] 58.4 kg (128 lb 12 oz) (10/03 0500) Intake/Output     10/02 0701 - 10/03 0700 10/03 0701 - 10/04 0700   I.V. (mL/kg) 2400 (41.1) 800 (13.7)   Other 60    NG/GT 1440 300   IV Piggyback     Total Intake(mL/kg) 3900 (66.8) 1100 (18.8)   Urine (mL/kg/hr) 1450 (1) 565 (1.1)   Drains 275 (0.2) 87 (0.2)   Stool 150 (0.1)    Total Output 1875 652   Net +2025 +448         Awake, alert, responive to family Trached, on trach collar Follows commands RUE/RLE W/D LLU, Min movement of LUE Wounds c/d/i  LABS: Lab Results  Component Value Date   CREATININE 0.33* 10/03/2012   BUN 22 10/03/2012   NA 137 10/03/2012   K 3.8 10/03/2012   CL 100 10/03/2012   CO2 31 10/03/2012   Lab Results  Component Value Date   WBC 9.1 10/03/2012   HGB 9.8* 10/03/2012   HCT 28.6* 10/03/2012   MCV 82.4 10/03/2012   PLT 163 10/03/2012    IMAGING: No new imaging overnight  IMPRESSION: - 27 y.o. female SAH d# 22 s/p RMCA clipping - Neurologically stable, shunt dependent  PLAN: - Cont EVD at , plan on shunt early next week. Will check CSF protein today

## 2012-10-03 NOTE — Progress Notes (Signed)
Passy-Muir Speaking Valve - Treatment Patient Details  Name: Sally Nelson MRN: [id number] Date of Birth: [birth year]  Today's Date: 10/03/2012 Time: 0926-1007 SLP Time Calculation (min): 41 min  Past Medical History:  Past Medical History  Diagnosis Date  . Heart murmur   . Asthma    Past Surgical History:  Past Surgical History  Procedure Laterality Date  . No past surgeries    . Radiology with anesthesia N/A 09/11/2012    Procedure: RADIOLOGY WITH ANESTHESIA-CEREBRAL ANEURYSM COILING;  Surgeon: Medication Radiologist, MD;  Location: [facility] OR;  Service: Radiology;  Laterality: N/A;  . Craniotomy Right 09/11/2012    Procedure: CRANIOTOMY FOR CLIPPING OF  ANEURYSM ;  Surgeon: Lisbeth Renshaw, MD;  Location: [facility] NEURO ORS;  Service: Neurosurgery;  Laterality: Right;  . Craniotomy Right 09/12/2012    Procedure: Right decompressive craniectomy and placement of boneflap in right lower quadrant;  Surgeon: Lisbeth Renshaw, MD;  Location: [facility] NEURO ORS;  Service: Neurosurgery;  Laterality: Right;    Assessment / Plan / Recommendation Clinical Impression  Treatment session focused on evaluating tolerance of PMSV and techniques to elicit phonation. Pt able to wear PMSV for 30 minute intervals without signs of CO2 trapping. Suggested downsize to MD, but pt not ready due to possible upcoming procedure and need for ventilator support. Would feel more comfortable allowing pt to wear PMSV with full supervision from parent or staff when trach is downsized.   With max cues for phonation (automatic speech tasks, auditory tactile and visual cues, singing, melodic intonation, use of parent as therapeutic agent) pt was heard to minimally adduct cords on ehxhalation several times. Poor control of deep inhalation and controlled exhalation may be primary cause of limited phonation. Pt also with minimal initiation of oral motor movements with max cues despite very strong movement with automatic actions (yawn,  licking lips). Will continue efforts.     Plan  Continue with current plan of care    Follow Up Recommendations  Inpatient Rehab    Pertinent Vitals/Pain NA    SLP Goals SLP Goal #1: Pt will initiate phonation with max cues during automatic speech task.  SLP Goal #1 - Progress: Progressing toward goal SLP Goal #2: Pt will tolerate PMSV placement during all waking hours with supervision.  SLP Goal #2 - Progress: Progressing toward goal SLP Goal #3: Pt will utilize thumbs up/down with max verbal cues to communicate basic wants needs.  SLP Goal #3 - Progress: Progressing toward goal SLP Goal #4: Pt will sustain attention to basic communicative task for 1 minute with max verbal cues.  SLP Goal #4 - Progress: Progressing toward goal   PMSV Trial  PMSV was placed for: Able to redirect subglottic air through upper airway: Yes Able to Attain Phonation: Yes Voice Quality: Low vocal intensity (minimal phonation) Able to Expectorate Secretions:  (not observed) Breath Support for Phonation: Inadequate Intelligibility: Unable to assess (comment) Respirations During Trial: 15 SpO2 During Trial: 99 %   Tracheostomy Tube  Additional Tracheostomy Tube Assessment Trach Collar Period: 24 Frequency of Tracheal Suctioning: rare    Vent Dependency  Vent Dependent: No FiO2 (%): 28 %    Cuff Deflation Trial  GO    Sally Adamek, MA CCC-SLP [phone]  Tolerated Cuff Deflation: Yes Length of Time for Cuff Deflation Trial: baseline Behavior: Alert;Cooperative   Sally Nelson, Riley Nearing 10/03/2012, 10:15 AM

## 2012-10-04 LAB — BASIC METABOLIC PANEL
Calcium: 8.9 mg/dL (ref 8.4–10.5)
Creatinine, Ser: 0.33 mg/dL — ABNORMAL LOW (ref 0.50–1.10)
GFR calc Af Amer: >90 mL/min (ref >90)
GFR calc non Af Amer: >90 mL/min (ref >90)
Sodium: 142 mEq/L (ref 135–145)

## 2012-10-04 LAB — CBC
MCH: 28.4 pg (ref 26.0–34.0)
MCV: 83.9 fL (ref 78.0–100.0)
Platelets: 146 10*3/uL — ABNORMAL LOW (ref 150–400)
RDW: 20.3 % — ABNORMAL HIGH (ref 11.5–15.5)

## 2012-10-04 LAB — MAGNESIUM: Magnesium: 1.9 mg/dL (ref 1.5–2.5)

## 2012-10-04 MED ORDER — HYDROCORTISONE SOD SUCCINATE 100 MG IJ SOLR
25.0000 mg | Freq: Two times a day (BID) | INTRAMUSCULAR | Status: DC
  Administered 2012-10-04 – 2012-10-05 (×2): 25 mg via INTRAVENOUS
  Filled 2012-10-04 (×4): qty 0.5

## 2012-10-04 NOTE — Progress Notes (Signed)
Overall stable. No new issues overnight.  Afebrile. Vitals are stable. Continues to require ventricular drainage. Awake and alert. Trach collar in place. Follows commands on the right. Left hemiparesis stable. Wound clean and dry.  Overall stable. Continue current management. Tentatively plan for a VP shunt on Monday.

## 2012-10-05 LAB — GLUCOSE, CAPILLARY
Glucose-Capillary: 118 mg/dL — ABNORMAL HIGH (ref 70–99)
Glucose-Capillary: 95 mg/dL (ref 70–99)
Glucose-Capillary: 99 mg/dL (ref 70–99)

## 2012-10-05 MED ORDER — WHITE PETROLATUM GEL
Status: AC
  Administered 2012-10-05: 0.2
  Filled 2012-10-05: qty 5

## 2012-10-05 MED ORDER — HYDROCORTISONE SOD SUCCINATE 100 MG IJ SOLR
25.0000 mg | Freq: Every day | INTRAMUSCULAR | Status: DC
  Administered 2012-10-06: 25 mg via INTRAVENOUS
  Filled 2012-10-05: qty 0.5

## 2012-10-05 NOTE — Progress Notes (Signed)
PULMONARY  / CRITICAL CARE MEDICINE  Name: Sally Nelson MRN: [id number] DOB: 06/25/1985    ADMISSION DATE:  09/11/2012 CONSULTATION DATE:  09/11/2012  REFERRING MD :  Neurosurgery PRIMARY SERVICE: PCCM  CHIEF COMPLAINT:  Post op vent management  BRIEF PATIENT DESCRIPTION: 27 y/o with SAH s/p craniotomy and clipping of R MCA aneurysm.  SIGNIFICANT EVENTS / STUDIES:  9/12 OR >>> Craniotomy, clipping R MCA aneurysm for a [facility] 9/12 Head CT >>> Interval development R SDH, effacement basilar cisterns, evolving R MCA CVA, midline shift 14mm 9/23 PEG Tube, tracheostomy placement  LINES / TUBES: ETT 9/12 >>9/23 L [place]  9/12 >>> PEG 9/23 >>> Tracheostomy (jy) 9/23 >>> Ventriculostomy 9/19 >>>  CULTURES: 9/12 Respirstory >>> neg 9/12 Blood >>> neg 9/17 Urine >>> neg 9/18 Blood >>> neg Blood 9/22 >>> Urine 9/22 >>> E. Coli sensitive to Zosyn CSF 9/22 >>> neg Respiratory 9/22 >>>  ANTIBIOTICS: Unasyn 9/12 >>> 9/20 Vancomycin 9/22 >>> 9/29 Zosyn 9/22 >>> 9/29  SUBJECTIVE:  No acute issues overnight.  VITAL SIGNS: Temp:  [98 F (36.7 C)] 98 F (36.7 C) (10/05 0739) Pulse Rate:  [59-88] 86 (10/05 0753) Resp:  [10-18] 18 (10/05 0753) BP: (108-134)/(64-88) 132/83 mmHg (10/05 0753) SpO2:  [97 %-100 %] 100 % (10/05 0753) FiO2 (%):  [28 %] 28 % (10/05 0800) Weight:  [59.2 kg (130 lb 8.2 oz)] 59.2 kg (130 lb 8.2 oz) (10/05 0500)  VENTILATOR SETTINGS: Vent Mode:  [-]  FiO2 (%):  [28 %] 28 %  INTAKE / OUTPUT: Intake/Output     10/04 0701 - 10/05 0700 10/05 0701 - 10/06 0700   I.V. (mL/kg) 2400 (40.5) 100 (1.7)   NG/GT 1440 60   Total Intake(mL/kg) 3840 (64.9) 160 (2.7)   Urine (mL/kg/hr) 1870 (1.3) 475 (4.3)   Drains 239 (0.2)    Total Output 2109 475   Net +1731 -315         PHYSICAL EXAMINATION: Gen: Comfortable HEENT: Ventriculostomy in place PULM: Tracheostomy, bilateral air entry CV: Regular, systolic / diastolic murmur AB: Soft abdomen, bowel sounds present,  PEG in place Ext: No edema Derm: Skin intact Neuro: Non-verbal follows commands, left hemiparesis  Recent Labs Lab 09/29/12 0500 09/30/12 0335 09/30/12 0741 10/01/12 0500 10/02/12 0500 10/03/12 0500 10/04/12 0429  HGB 9.7*  --   --  10.3* 10.5* 9.8* 9.7*  WBC 9.9  --   --  11.2* 11.1* 9.1 8.8  PLT 151  --   --  173 177 163 146*  NA 137  --  137 138 139 137 142  K 3.3*  --  3.8 3.1* 4.0 3.8 4.1  CL 100  --  100 99 101 100 104  CO2 28  --  30 32 30 31 30   GLUCOSE 106*  --  125* 123* 124* 116* 125*  BUN 13  --  17 17 16 22 20   CREATININE 0.34*  --  0.38* 0.31* 0.33* 0.33* 0.33*  CALCIUM 8.9  --  9.0 9.2 9.0 9.0 8.9  MG 1.7 2.2  --  2.0 2.0 2.1 1.9  PHOS 3.7 4.0  --  3.7 4.1 3.9 3.7    Recent Labs Lab 10/04/12 0722 10/04/12 1126 10/04/12 1519 10/04/12 2343 10/05/12 0724  GLUCAP 140* 117* 101* 99 97   CXR:  ASSESSMENT / PLAN:  PULMONARY A:  Post op respiratory failure. S/p tracheostomy.  Suspected aspiration pneumonia.  Asthma, stable. 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  HTN.  Chronic diastolic heart failure. P:  Metoprolol PRN Zocor  RENAL A:  Suspected cerebral salt waisting, stabilized on Fludrocortisone. P:   Trend BMP NS@100  Avoid free water with cerebral edema Continue fludrocortisone  GASTROINTESTINAL A:  Dysphagia, sp PEG placement. P:   TF Protonix for GI Px  HEMATOLOGIC A:  Anemia. P:  Trend CBC Heparin for DVT Px  INFECTIOUS A:  Suspected aspiration pneumonia.  UTI. P:   Completed abx as above  ENDOCRINE A:   Steroid induced hyperglycemia, resolved.  Suspected adrenal insufficiency. P:   Glycemic Control Protocol, Phase 1 Decrease Hydrocortisone to 25 daily  NEUROLOGIC A:  SAH, s/p R MCA clipping c/b R MCA territory CVA, edema and  effacement. P:   Neurosurgery following VP shunt early next week Keppra  I have personally obtained history, examined patient, evaluated and interpreted laboratory and imaging results, reviewed medical records, formulated assessment / plan and placed orders.  Lonia Farber, MD Pulmonary and Critical Care Medicine [facility] Pager: [phone]  10/05/2012, 8:53 AM

## 2012-10-06 LAB — GLUCOSE, CAPILLARY
Glucose-Capillary: 111 mg/dL — ABNORMAL HIGH (ref 70–99)
Glucose-Capillary: 117 mg/dL — ABNORMAL HIGH (ref 70–99)
Glucose-Capillary: 132 mg/dL — ABNORMAL HIGH (ref 70–99)
Glucose-Capillary: 83 mg/dL (ref 70–99)
Glucose-Capillary: 88 mg/dL (ref 70–99)
Glucose-Capillary: 92 mg/dL (ref 70–99)

## 2012-10-06 MED ORDER — FUROSEMIDE 10 MG/ML IJ SOLN
40.0000 mg | Freq: Two times a day (BID) | INTRAMUSCULAR | Status: AC
  Administered 2012-10-06 – 2012-10-07 (×4): 40 mg via INTRAVENOUS
  Filled 2012-10-06 (×5): qty 4

## 2012-10-06 NOTE — Progress Notes (Signed)
PULMONARY  / CRITICAL CARE MEDICINE  Name: Sally Nelson MRN: [id number] DOB: 19-Mar-1985    ADMISSION DATE:  09/11/2012 CONSULTATION DATE:  09/11/2012  REFERRING MD :  Neurosurgery PRIMARY SERVICE: PCCM  CHIEF COMPLAINT:  Post op vent management  BRIEF PATIENT DESCRIPTION: 27 y/o with SAH s/p craniotomy and clipping of R MCA aneurysm for [facility] 9/12. PCCM continuing to follow for post op vent management.  SIGNIFICANT EVENTS / STUDIES:  9/12 OR >>> Craniotomy, clipping R MCA aneurysm for a [facility] 9/12 Head CT >>> Interval development R SDH, effacement basilar cisterns, evolving R MCA CVA, midline shift 14mm 9/23 PEG Tube, tracheostomy placement 10/1 CT head: increasing hydrocephalus, persistent herniation of right frontal and temporal lobes, mild intraventricular hemorrhage 10/6 scheduled for VP shunt placement. continues to have ventriculostomy drainage. Trach in place  LINES / TUBES: ETT 9/12 >>9/23 L [place]  9/12 >>> PEG 9/23 >>> Tracheostomy (jy) 9/23 >>> Ventriculostomy 9/19 >>>  CULTURES: 9/11 MRSA>>> neg 9/12 Respirstory >>> gram neg cocci, non-pathogenic flora 9/12 Blood >>> neg 9/17 Urine >>> neg 9/18 Blood >>> neg 9/22Blood >>>Neg 9/22 Urine >>> E. Coli sensitive to Zosyn 9/22 CSF >>> neg 9/22 Respiratory >>>neg  ANTIBIOTICS: Unasyn 9/12 >>> 9/20 Vancomycin 9/22 >>> 9/29 Zosyn 9/22 >>> 9/29  SUBJECTIVE:  No acute issues overnight.  VITAL SIGNS: Temp:  [97.7 F (36.5 C)-98.1 F (36.7 C)] 97.7 F (36.5 C) (10/06 0421) Pulse Rate:  [61-100] 68 (10/06 0731) Resp:  [11-35] 14 (10/06 0731) BP: (109-150)/(62-92) 115/77 mmHg (10/06 0731) SpO2:  [96 %-100 %] 100 % (10/06 0731) FiO2 (%):  [28 %] 28 % (10/06 0731) Weight:  [136 lb 0.4 oz (61.7 kg)] 136 lb 0.4 oz (61.7 kg) (10/06 0359)  VENTILATOR SETTINGS: Vent Mode:  [-]  FiO2 (%):  [28 %] 28 %  INTAKE / OUTPUT: Intake/Output     10/05 0701 - 10/06 0700 10/06 0701 - 10/07 0700   I.V. (mL/kg) 2400 (38.9)     NG/GT 1480    Total Intake(mL/kg) 3880 (62.9)    Urine (mL/kg/hr) 1415 (1)    Drains 141 (0.1)    Total Output 1556     Net +2324          Urine Occurrence 2 x     PHYSICAL EXAMINATION: Gen: chronically ill appearing female, resting comfortably, alert Neuro: RASS -1, non- verbal, follows mild commands, left hemiparesis HEENT: Ventriculostomy in place, tracheostomy in place, PERRL PULM: clear to auscultation bilaterally  CV: Regular, Systolic/diastolic murmur  AB: Soft abdomen, + BS, PEG in place Ext: No edema, skin warm and dry   Recent Labs Lab 09/30/12 0335  09/30/12 0741  10/01/12 0500 10/02/12 0500 10/03/12 0500 10/04/12 0429  HGB  --   --   --   --  10.3* 10.5* 9.8* 9.7*  WBC  --   --   --   --  11.2* 11.1* 9.1 8.8  PLT  --   --   --   < > 173 177 163 146*  NA  --   --  137  --  138 139 137 142  K  --   < > 3.8  --  3.1* 4.0 3.8 4.1  CL  --   --  100  --  99 101 100 104  CO2  --   --  30  --  32 30 31 30   GLUCOSE  --   --  125*  --  123* 124* 116* 125*  BUN  --   --  17  --  17 16 22 20   CREATININE  --   --  0.38*  --  0.31* 0.33* 0.33* 0.33*  CALCIUM  --   --  9.0  --  9.2 9.0 9.0 8.9  MG 2.2  --   --   --  2.0 2.0 2.1 1.9  PHOS 4.0  --   --   --  3.7 4.1 3.9 3.7  < > = values in this interval not displayed.  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), required surgery as an infant Chronic diastolic heart failure HTN P:  - Metoprolol PRN - continue Zocor -consider dc CVP -consider lasix  RENAL A:    Suspected cerebral salt waisting-stabilized on Fludrocortisone P:   -Trend BMP -NS@100 , consider kvo, her urine output now does NOT reflect CSW now at least and need to avoid such gross pos balance -She likely needs lasix -Avoid free water with cerebral edema -Continue fludrocortisone  GASTROINTESTINAL/ GU A:   Dysphagia- s/p PEG placement UTI- resolved, treated with zosyn P:   -TF- many need to hold prior to shunt placement, per NS -PPI for SUP  -Oxybutynin   HEMATOLOGIC A:   Anemia P:  -Trend CBC -continue Heparin SQ for DVT , may need to be held for shunt -consider transfusion if hgb < 7   INFECTIOUS A:  Suspected aspiration pneumonia   UTI- resolved, + for e.coli treated with Zosyn  P:   -Completed abx as above -dc foley when able, will d/w RN  ENDOCRINE A:   Steroid induced hyperglycemia, resolved.   Suspected adrenal insufficiency. P:   -Glycemic Control Protocol, Phase 1 - Hydrocortisone at 25 daily, dc  NEUROLOGIC A:   SAH- s/p R MCA clipping c/b R MCA territory CVA, edema and effacement P:   -Neurosurgery following -VP shunt tentatively planned for today or early this week - continue Keppra  - continue Fentanyl PRN  - PT/OT when able  - continue to monitor ventriculostomy drainage  - Daily WUA  Nathanial Rancher PA- [facility]   I have personally obtained history, examined patient, evaluated and interpreted laboratory and imaging results, reviewed medical records, formulated assessment / plan and placed orders.   10/06/2012, 7:39 AM Ccm time 30 min   Mcarthur Rossetti. Tyson Alias, MD, FACP Pgr: [phone] [place] [facility]

## 2012-10-06 NOTE — Progress Notes (Signed)
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     PT Comments   Pt remains very impaired. She responds with opening eyes to her name and assists with holding RUE and RLE against gravity when placed. With passive head rotation to her Lt, her eyes immediately deviated to the Rt, however with her name being called and cues to "find me" by the PT in front of her, pt turned eyes from Rt to midline. Noted ICP ranged from 11-21 during session (higher when more alert).    Follow Up Recommendations  CIR;Supervision/Assistance - 24 hour     Does the patient have the potential to tolerate intense rehabilitation     Barriers to Discharge        Equipment Recommendations  Other (comment) (TBAj)    Recommendations for Other Services    Frequency Min 3X/week   Progress towards PT Goals Progress towards PT goals: Not progressing toward goals - comment (decr alertness)  Plan Current plan remains appropriate    Precautions / Restrictions Precautions Precautions: Fall;Other (comment) Precaution Comments: ventricular drain; clamped by RN prior to movement   Pertinent Vitals/Pain ICP 11-21 during session (after RN clamped drain)    Mobility  Bed Mobility Details for Bed Mobility Assistance: bed placed in chair position to incr arousal Modified Rankin (Stroke Patients Only) Pre-Morbid Rankin Score: No significant disability Modified Rankin: Severe disability    Exercises General Exercises - Upper Extremity Shoulder ABduction: AAROM;Right;5 reps;Seated (in conjunction with assisted scratching her nose) Shoulder Horizontal ADduction: AAROM;Right;5 reps;Seated  (assisted with scratching her nose) Elbow Flexion: AAROM;Right;5 reps;Seated (pt controlling return to elbow extension from flexed positio) Wrist Flexion: PROM;Right;Left;5 reps;Seated Wrist Extension: PROM;Both;5 reps;Seated Digit Composite Flexion: PROM;Both;5 reps;Seated Composite Extension: PROM;Both;5 reps;Seated General Exercises - Lower Extremity Ankle Circles/Pumps: PROM;Both;10 reps;Seated Short Arc Quad: AAROM;Right;5 reps;Seated (pt controls flexion from extended position) Heel Slides: PROM;Both;5 reps;Seated Hip Flexion/Marching: PROM;Right;5 reps;Seated Other Exercises Other Exercises: Rt hip external rotation while in seated position to cross Rt ankle over Lt knee; pt instructed to "uncross your legs" and she moved Rt foot forward off Lt knee and leg returned to bed Other Exercises: cervical rotation PROM Rt and Lt   PT Diagnosis:    PT Problem List:   PT Treatment Interventions:     PT Goals (current goals can now be found in the care plan section)    Visit Information  Last PT Received On: 10/06/12 Assistance Needed: +2 (sit EOB) History of Present Illness: 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      Subjective Data  Subjective: No attempts to communicate   Cognition  Cognition Arousal/Alertness: Lethargic Behavior During Therapy: Flat affect Overall Cognitive Status: Impaired/Different from baseline Area of Impairment: Following commands Following Commands: Follows one step commands inconsistently General Comments: Pt would arouse and open eyes to name and movement/stimulation on her Rt side; eyes closing when not stimulated    Balance     End of Session PT - End of Session Equipment Utilized During Treatment: Oxygen;Other (comment) (trach; ventricular drain clamped) Activity Tolerance: Other (comment);Patient limited  by fatigue (limited by decr  attention) Patient left: in bed;with call bell/phone within reach Nurse Communication: Mobility status;Other (comment) (session ended and ready for RN to unclamp drain)   GP     Veniamin Kincaid 10/06/2012, 3:44 PM Pager [phone]

## 2012-10-06 NOTE — Progress Notes (Signed)
Pt seen and examined. No issues overnight.  EXAM: Temp:  [97.7 F (36.5 C)-98.6 F (37 C)] 98.6 F (37 C) (10/06 1233) Pulse Rate:  [59-100] 59 (10/06 1500) Resp:  [12-35] 15 (10/06 1500) BP: (111-150)/(69-92) 114/70 mmHg (10/06 1500) SpO2:  [96 %-100 %] 100 % (10/06 1500) FiO2 (%):  [28 %] 28 % (10/06 1500) Weight:  [61.7 kg (136 lb 0.4 oz)] 61.7 kg (136 lb 0.4 oz) (10/06 0359) Intake/Output     10/05 0701 - 10/06 0700 10/06 0701 - 10/07 0700   I.V. (mL/kg) 2400 (38.9) 3632.3 (58.9)   Other  100   NG/GT 1480 0   Total Intake(mL/kg) 3880 (62.9) 3732.3 (60.5)   Urine (mL/kg/hr) 1415 (1) 3950 (7.7)   Drains 141 (0.1) 45 (0.1)   Total Output 1556 3995   Net +2324 -262.7        Urine Occurrence 2 x     EVD in place, 141cc x 24hrs Awake, alert On trach collar Follows commands RUE/RLE Wound c/d/i  LABS: Lab Results  Component Value Date   CREATININE 0.33* 10/04/2012   BUN 20 10/04/2012   NA 142 10/04/2012   K 4.1 10/04/2012   CL 104 10/04/2012   CO2 30 10/04/2012   Lab Results  Component Value Date   WBC 8.8 10/04/2012   HGB 9.7* 10/04/2012   HCT 28.6* 10/04/2012   MCV 83.9 10/04/2012   PLT 146* 10/04/2012    IMPRESSION: - 27 y.o. 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 Abbey Chatters for laparoscopic assisted VPS. Will schedule tentatively for Thurs pm. - Routine CBC, BMP, PT/INR, aPTT

## 2012-10-06 NOTE — Progress Notes (Signed)
OT Cancellation Note  Patient Details Name: Tessia Kassin MRN: [id number] DOB: Apr 07, 1985   Cancelled Treatment:    Reason Eval/Treat Not Completed: Patient at procedure or test/ unavailable Pt having shunt placed today. Please note when pt is able to resume OT/PT. Thank you. [facility] Nicolina Hirt, OTR/L  [phone] 10/06/2012 10/06/2012, 10:02 AM

## 2012-10-06 NOTE — Progress Notes (Signed)
SLP Cancellation Note  Patient Details Name: Sheyla Zaffino MRN: [id number] DOB: 09/18/1985   Cancelled treatment:       Reason Eval/Treat Not Completed: Medical issues which prohibited therapy;Patient at procedure or test/unavailable. Will have shunt placed today. Will f/u. Hopeful for trach downsize after procedure.    Terriah Reggio, Riley Nearing 10/06/2012, 2:05 PM

## 2012-10-07 ENCOUNTER — Inpatient Hospital Stay (HOSPITAL_COMMUNITY): Payer: Medicaid Other

## 2012-10-07 DIAGNOSIS — G912 (Idiopathic) normal pressure hydrocephalus: Secondary | ICD-10-CM

## 2012-10-07 LAB — BASIC METABOLIC PANEL
BUN: 19 mg/dL (ref 6–23)
Calcium: 9.3 mg/dL (ref 8.4–10.5)
Chloride: 98 mEq/L (ref 96–112)
Creatinine, Ser: 0.36 mg/dL — ABNORMAL LOW (ref 0.50–1.10)
GFR calc Af Amer: >90 mL/min (ref >90)
Sodium: 143 mEq/L (ref 135–145)

## 2012-10-07 LAB — CBC
HCT: 30.6 % — ABNORMAL LOW (ref 36.0–46.0)
Hemoglobin: 10.3 g/dL — ABNORMAL LOW (ref 12.0–15.0)
MCHC: 33.7 g/dL (ref 30.0–36.0)
MCV: 84.5 fL (ref 78.0–100.0)
RBC: 3.62 MIL/uL — ABNORMAL LOW (ref 3.87–5.11)

## 2012-10-07 LAB — PROTIME-INR
INR: 1.02 (ref 0.00–1.49)
Prothrombin Time: 13.2 seconds (ref 11.6–15.2)

## 2012-10-07 LAB — PHOSPHORUS: Phosphorus: 4.2 mg/dL (ref 2.3–4.6)

## 2012-10-07 LAB — MAGNESIUM: Magnesium: 1.9 mg/dL (ref 1.5–2.5)

## 2012-10-07 MED ORDER — MAGNESIUM SULFATE 50 % IJ SOLN
2.0000 g | Freq: Once | INTRAVENOUS | Status: DC
  Filled 2012-10-07: qty 4

## 2012-10-07 MED ORDER — MAGNESIUM SULFATE 40 MG/ML IJ SOLN
2.0000 g | Freq: Once | INTRAMUSCULAR | Status: AC
  Administered 2012-10-07: 2 g via INTRAVENOUS
  Filled 2012-10-07 (×2): qty 50

## 2012-10-07 MED ORDER — POTASSIUM CHLORIDE 20 MEQ/15ML (10%) PO LIQD
40.0000 meq | ORAL | Status: AC
  Administered 2012-10-07 (×2): 40 meq
  Filled 2012-10-07 (×2): qty 30

## 2012-10-07 NOTE — Progress Notes (Signed)
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     PT Comments   Pt with noted R UE/LE tone this date. Pt remains to have minimal mvmt x 4 extremities and cognitive deficits. Pt con't to require total assist for all mobility and ADLs. Pt remains to have cognitive and vision deficits as well. Acute PT to con't to work with pt to progress mobility as able.   Follow Up Recommendations  CIR;Supervision/Assistance - 24 hour     Does the patient have the potential to tolerate intense rehabilitation     Barriers to Discharge        Equipment Recommendations       Recommendations for Other Services Rehab consult  Frequency Min 3X/week   Progress towards PT Goals Progress towards PT goals: Not progressing toward goals - comment  Plan Current plan remains appropriate    Precautions / Restrictions Precautions Precautions: Fall (ventric drain) Precaution Comments: ventricular drain; clamped by RN prior to movement Restrictions Weight Bearing Restrictions: No   Pertinent Vitals/Pain Denies pain    Mobility  Bed Mobility Bed Mobility: Rolling Left;Left Sidelying to Sit;Sitting - Scoot to Edge of Bed Rolling Left: 1: +2 Total assist Rolling Left: Patient Percentage: 10% Left Sidelying to Sit: 1: +2 Total assist;HOB elevated Left Sidelying to Sit: Patient Percentage: 10% Sitting - Scoot to Edge of Bed: 1: +2 Total assist Sitting - Scoot to Edge of Bed: Patient Percentage: 0% Details for Bed Mobility Assistance: pt with some initiation of R UE for rolling. pt attempted to push up with R UE into sitting. total assist for LE management despite  verbal/tactile cues. maxA for trunk elevation Transfers Transfers: Sit to Stand;Stand to Sit;Stand Pivot Transfers Sit to Stand: 1: +2 Total assist;From bed Sit to Stand: Patient Percentage: 10% Stand to Sit: 1: +2 Total assist;To bed Stand to Sit: Patient Percentage: 10% Stand Pivot Transfers: 1: +2 Total assist (+ 1 for head management and lines management) Stand Pivot Transfers: Patient Percentage: 0% Details for Transfer Assistance: utilized 3 muskateer lift via OT and Tech, SLP assisted with head control and line management. PT assisted with bilat LEs mvmt to maximize appropriate kinematics, weigh-shifting. Pt with no initiation of LE mvmt. Pt with noted R LE tone/increased R LE knee flexion. 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      Subjective Data  Subjective: pt able to complete thumbs up on R to answer inconsistantly   Cognition  Cognition Arousal/Alertness: Lethargic Behavior During Therapy: Flat affect Overall Cognitive Status: Impaired/Different from baseline Area of Impairment: Following commands;Attention;Problem solving  Following Commands: Follows one step commands inconsistently Problem Solving: Slow processing;Decreased initiation;Difficulty sequencing;Requires verbal cues;Requires tactile cues General Comments: pt requires significant time to  respond    Balance  Balance Balance Assessed: Yes Static Sitting Balance Static Sitting - Balance Support: Feet supported;Right upper extremity supported (tactile cues/support trunk/head) Static Sitting - Level of Assistance: 2: Max assist;Other (comment) Static Sitting - Comment/# of Minutes: worked at EOB x 15 min. provided physical tactile cues to cervical extensors to promote active head control however unsuccessful. also provided tactile cues to R quad in attempt to stimulate  active mvmt however also unsuccessful.  Static Standing Balance Static Standing - Balance Support: Bilateral upper extremity supported Static Standing - Level of Assistance: 1: +2 Total assist;Patient percentage (comment) (10%) Static Standing - Comment/# of Minutes: OT and tech assist pt into standing. PT worked at LE to facilitate weight-bearing via mini squats. Pt requires max assist at knee for control. Pt with noted R LE onset of tone during standing  End of Session PT - End of Session Equipment Utilized During Treatment: Oxygen (trach, vetricular drain clamped) Activity Tolerance: Patient limited by lethargy Patient left: in chair;with call bell/phone within reach;with nursing/sitter in room Nurse Communication: Mobility status;Need for lift equipment (lift pad placed in chair)   GP     Sally Nelson 10/07/2012, 12:24 PM  Lewis Shock, PT, DPT Pager #: [phone] Office #: [phone]

## 2012-10-07 NOTE — Progress Notes (Signed)
NUTRITION FOLLOW UP  Intervention:   Continue current TF regimen  1. Osmolite 1.2 @ 60 ml/hr  2. 30 ml Prostat daily  Tube feeding regimen provides 1828 kcal, 95 grams of protein, and 1167 ml of H2O.   Nutrition Dx:   Inadequate oral intake related to inability to eat as evidenced by NPO status; ongoing.    Goal:  Intake to meet >90% of estimated nutrition needs, met.   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 Plans for a VP shunt.  Trach and PEG 9/23. Pt now on trach collar.  Pt started on scheduled imodium 9/29.  Pt's tube feeding changed to Osmolite 1.2 on 10/2 due to persistent diarrhea. Diarrhea now resolved. Flexiseal removed 10/5.     Height: Ht Readings from Last 1 Encounters:  09/11/12 5\' 1"  (1.549 m)    Weight Status:   Wt Readings from Last 1 Encounters:  10/07/12 124 lb 12.5 oz (56.6 kg)  Admission weight: 118 lb 9/11  Re-estimated needs:  Kcal: 1700-1900 Protein: 80-100 grams  Fluid: >1.5 L/day  Skin: head and abd incision  Diet Order:   NPO    Intake/Output Summary (Last 24 hours) at 10/07/12 1608 Last data filed at 10/07/12 1600  Gross per 24 hour  Intake 1967.67 ml  Output   3783 ml  Net -1815.33 ml    Last BM: 10/6  Labs:   Recent Labs Lab 10/03/12 0500 10/04/12 0429 10/07/12 0600  NA 137 142 143  K 3.8 4.1 3.1*  CL 100 104 98  CO2 31 30 38*  BUN 22 20 19   CREATININE 0.33* 0.33* 0.36*  CALCIUM 9.0 8.9 9.3  MG 2.1 1.9 1.9  PHOS 3.9 3.7 4.2  GLUCOSE 116* 125* 103*    CBG (last 3)   Recent Labs  10/06/12 0356 10/06/12 0803 10/06/12 1127  GLUCAP 83 92 112*    Scheduled Meds: . antiseptic oral rinse  15 mL Mouth Rinse QID  . chlorhexidine  15 mL Mouth Rinse BID  . feeding supplement (PRO-STAT SUGAR FREE 64)  30 mL Per Tube Daily  . furosemide  40 mg Intravenous BID  . heparin subcutaneous  5,000 Units  Subcutaneous Q8H  . levETIRAcetam  500 mg Per Tube Q12H  . loperamide  4 mg Per Tube BID  . oxybutynin  2.5 mg Per Tube BID  . pantoprazole sodium  40 mg Per Tube Q1200  . simvastatin  80 mg Oral QHS    Continuous Infusions: . sodium chloride 20 mL/hr at 10/07/12 0810  . feeding supplement (OSMOLITE 1.2 CAL) 1,000 mL (10/07/12 0855)    Kendell Bane RD, LDN, CNSC [phone] Pager [phone] After Hours Pager

## 2012-10-07 NOTE — Progress Notes (Signed)
Speech Language Pathology Treatment Patient Details Name: Sally Nelson MRN: [id number] DOB: 1985/11/23 Today's Date: 10/07/2012 Time: 8469-6295 SLP Time Calculation (min): 15 min  Assessment / Plan / Recommendation Clinical Impression  Treatment session focused on eliciting oral motor movement, head support, voice with PMSV in place. Pt tolerated PMSV for approx 10 minutes though required total assist for head support. Otherwise upper airway minimally patent due to positioning and snoring respiration begin with pt blowing off trach. No volitional phonation heard, involuntary phonation audible during yawn. Hopeful downsize of trach will lead to greater responsiveness.     SLP Plan  Continue with current plan of care    Pertinent Vitals/Pain NA  SLP Goals  SLP Goals Potential to Achieve Goals: Fair Potential Considerations: Financial resources;Family/community support;Severity of impairments Progress/Goals/Alternative treatment plan discussed with pt/caregiver and they: Agree SLP Goal #1: Pt will initiate phonation with max cues during automatic speech task.  SLP Goal #1 - Progress: Progressing toward goal SLP Goal #2: Pt will tolerate PMSV placement during all waking hours with supervision.  SLP Goal #2 - Progress: Progressing toward goal SLP Goal #3: Pt will utilize thumbs up/down with max verbal cues to communicate basic wants needs.  SLP Goal #3 - Progress: Progressing toward goal SLP Goal #4: Pt will sustain attention to basic communicative task for 1 minute with max verbal cues.  SLP Goal #4 - Progress: Progressing toward goal  General Temperature Spikes Noted: No Respiratory Status: Trach Behavior/Cognition: Alert;Cooperative;Distractible;Requires cueing Oral Cavity - Dentition: Adequate natural dentition Patient Positioning: Upright in chair  Oral Cavity - Oral Hygiene Does patient have any of the following "at risk" factors?: Oxygen therapy - cannula, mask, simple  oxygen devices   Treatment Treatment focused on: Voice;Cognition   GO    Harlon Ditty, MA CCC-SLP [phone]  Claudine Mouton 10/07/2012, 1:19 PM

## 2012-10-07 NOTE — Progress Notes (Addendum)
Pt seen and examined. No issues overnight.  EXAM: Temp:  [97.9 F (36.6 C)-98.9 F (37.2 C)] 98 F (36.7 C) (10/07 0829) Pulse Rate:  [56-96] 64 (10/07 0700) Resp:  [8-23] 13 (10/07 0700) BP: (99-129)/(51-89) 109/67 mmHg (10/07 0700) SpO2:  [95 %-100 %] 100 % (10/07 0700) FiO2 (%):  [28 %] 28 % (10/07 0400) Weight:  [56.6 kg (124 lb 12.5 oz)] 56.6 kg (124 lb 12.5 oz) (10/07 0500) Intake/Output     10/06 0701 - 10/07 0700 10/07 0701 - 10/08 0700   I.V. (mL/kg) 3958.7 (69.9)    Other 100    NG/GT 960    Total Intake(mL/kg) 5018.7 (88.7)    Urine (mL/kg/hr) 5670 (4.2)    Drains 84 (0.1)    Total Output 5754     Net -735.3           EVD in place, 84cc x 24hrs, ICP 1-17mmHg Awake, alert On trach collar Follows commands RUE/RLE Wound c/d/i  LABS: Lab Results  Component Value Date   CREATININE 0.36* 10/07/2012   BUN 19 10/07/2012   NA 143 10/07/2012   K 3.1* 10/07/2012   CL 98 10/07/2012   CO2 38* 10/07/2012   Lab Results  Component Value Date   WBC 9.5 10/07/2012   HGB 10.3* 10/07/2012   HCT 30.6* 10/07/2012   MCV 84.5 10/07/2012   PLT 121* 10/07/2012    IMPRESSION: - 27 y.o. 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 Stop TF on Tues 6am.  - will speak with pts father RE: VPS surgery

## 2012-10-07 NOTE — Consult Note (Signed)
Reason for Consult:  Non-obstructive hydrocephalus Referring Physician: Dr. Athena Masse Sally Nelson is an 27 y.o. female.  HPI: I was asked to see her by Dr. Patric Dykes to assist with performing a laparoscopic ventriculoperitoneal shunt for non-obstructive hydrocephalus.  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 She underwent a craniotomy and clipping of a right MCA aneurysm. She subsequently did develop intracranial hypertension and had to have a decompressive craniotomy. The bone flap was placed in the right mid to lower abdomen in the subcutaneous tissues. Following all this, she also suffered a stroke. She's had a tracheostomy and a PEG placed. The patient was placed 2 weeks ago.  She has persistent hydrocephalus which is being drained externally. A ventriculoperitoneal shunt has been planned for later this week.  Past Medical History  Diagnosis Date  . Heart murmur   . Asthma     Past Surgical History  Procedure Laterality Date  . No past surgeries    . Radiology with anesthesia N/A 09/11/2012    Procedure: RADIOLOGY WITH ANESTHESIA-CEREBRAL ANEURYSM COILING;  Surgeon: Medication Radiologist, MD;  Location: [facility] OR;  Service: Radiology;  Laterality: N/A;  . Craniotomy Right 09/11/2012    Procedure: CRANIOTOMY FOR CLIPPING OF  ANEURYSM ;  Surgeon: Lisbeth Renshaw, MD;  Location: [facility] NEURO ORS;  Service: Neurosurgery;  Laterality: Right;  . Craniotomy Right 09/12/2012    Procedure: Right decompressive craniectomy and placement of boneflap in right lower quadrant;  Surgeon: Lisbeth Renshaw, MD;  Location: [facility] NEURO ORS;  Service: Neurosurgery;  Laterality: Right;    History reviewed. No pertinent family history.  Social History:  reports that she has been smoking Cigarettes.  She has been smoking about 0.00 packs per day. She does not have any smokeless tobacco history on file. Her alcohol and drug histories are not on  file.  Allergies:  Allergies  Allergen Reactions  . Contrast Media [Iodinated Diagnostic Agents] Hives    Current facility-administered medications:0.9 %  sodium chloride infusion, , Intravenous, Continuous, Lisbeth Renshaw, MD, Last Rate: 20 mL/hr at 10/07/12 0810;  acetaminophen (TYLENOL) solution 650 mg, 650 mg, Per Tube, Q6H PRN, Lonia Farber, MD;  acetaminophen (TYLENOL) suppository 650 mg, 650 mg, Rectal, Q4H PRN, Clydene Fake, MD, 650 mg at 09/23/12 0818 albuterol (PROVENTIL) (5 MG/ML) 0.5% nebulizer solution 2.5 mg, 2.5 mg, Nebulization, Q6H PRN, Lisbeth Renshaw, MD, 2.5 mg at 09/28/12 1137;  antiseptic oral rinse (BIOTENE) solution 15 mL, 15 mL, Mouth Rinse, QID, Oretha Milch, MD, 15 mL at 10/07/12 0400;  artificial tears (LACRILUBE) ophthalmic ointment, , Left Eye, Q3H PRN, Oretha Milch, MD chlorhexidine (PERIDEX) 0.12 % solution 15 mL, 15 mL, Mouth Rinse, BID, Oretha Milch, MD, 15 mL at 10/07/12 0839;  feeding supplement (OSMOLITE 1.2 CAL) liquid 1,000 mL, 1,000 mL, Per Tube, Continuous, Heather Cornelison Pitts, RD, Last Rate: 60 mL/hr at 10/07/12 0855, 1,000 mL at 10/07/12 0855;  feeding supplement (PRO-STAT SUGAR FREE 64) liquid 30 mL, 30 mL, Per Tube, Daily, Heather Cornelison Pitts, RD, 30 mL at 10/07/12 1034 fentaNYL (SUBLIMAZE) injection 25-100 mcg, 25-100 mcg, Intravenous, Q2H PRN, Oretha Milch, MD, 25 mcg at 09/26/12 0954;  furosemide (LASIX) injection 40 mg, 40 mg, Intravenous, BID, Nelda Bucks, MD, 40 mg at 10/07/12 0837;  heparin injection 5,000 Units, 5,000 Units, Subcutaneous, Q8H, Lisbeth Renshaw, MD, 5,000 Units at 10/07/12 0544 levETIRAcetam (KEPPRA) 100 MG/ML solution 500 mg, 500 mg, Per Tube, Q12H, Cala Bradford  Ballard Hammons, RPH, 500 mg at 10/07/12 0544;  loperamide (IMODIUM) 1 MG/5ML solution 4 mg, 4 mg, Per Tube, BID, Alyson Reedy, MD, 4 mg at 10/07/12 1034;  magnesium sulfate IVPB 2 g 50 mL, 2 g, Intravenous, Once, Lisbeth Renshaw, MD;   metoprolol (LOPRESSOR) injection 2.5 mg, 2.5 mg, Intravenous, Q6H PRN, Lisbeth Renshaw, MD, 2.5 mg at 09/19/12 0519 ondansetron (ZOFRAN) injection 4 mg, 4 mg, Intravenous, Q6H PRN, Clydene Fake, MD;  oxybutynin [facility]) 5 MG/5ML syrup 2.5 mg, 2.5 mg, Per Tube, BID, Alyson Reedy, MD, 2.5 mg at 10/07/12 1034;  pantoprazole sodium (PROTONIX) 40 mg/20 mL oral suspension 40 mg, 40 mg, Per Tube, Q1200, Merwyn Katos, MD, 40 mg at 10/06/12 1100 polyvinyl alcohol (LIQUIFILM TEARS) 1.4 % ophthalmic solution 1 drop, 1 drop, Both Eyes, PRN, Karn Cassis, MD, 1 drop at 09/28/12 0007;  potassium chloride 20 MEQ/15ML (10%) liquid 40 mEq, 40 mEq, Per Tube, Q4H, Nelda Bucks, MD, 40 mEq at 10/07/12 1034;  simvastatin (ZOCOR) tablet 80 mg, 80 mg, Oral, QHS, Lisbeth Renshaw, MD, 80 mg at 10/06/12 2221  Results for orders placed during the hospital encounter of 09/11/12 (from the past 48 hour(s))  GLUCOSE, CAPILLARY     Status: Abnormal   Collection Time    10/05/12  3:11 PM      Result Value Range   Glucose-Capillary 118 (*) 70 - 99 mg/dL  GLUCOSE, CAPILLARY     Status: None   Collection Time    10/05/12  7:42 PM      Result Value Range   Glucose-Capillary 95  70 - 99 mg/dL  GLUCOSE, CAPILLARY     Status: None   Collection Time    10/05/12 11:56 PM      Result Value Range   Glucose-Capillary 88  70 - 99 mg/dL  GLUCOSE, CAPILLARY     Status: None   Collection Time    10/06/12  3:56 AM      Result Value Range   Glucose-Capillary 83  70 - 99 mg/dL  GLUCOSE, CAPILLARY     Status: None   Collection Time    10/06/12  8:03 AM      Result Value Range   Glucose-Capillary 92  70 - 99 mg/dL  GLUCOSE, CAPILLARY     Status: Abnormal   Collection Time    10/06/12 11:27 AM      Result Value Range   Glucose-Capillary 112 (*) 70 - 99 mg/dL  BASIC METABOLIC PANEL     Status: Abnormal   Collection Time    10/07/12  6:00 AM      Result Value Range   Sodium 143  135 - 145 mEq/L   Potassium 3.1  (*) 3.5 - 5.1 mEq/L   Chloride 98  96 - 112 mEq/L   CO2 38 (*) 19 - 32 mEq/L   Glucose, Bld 103 (*) 70 - 99 mg/dL   BUN 19  6 - 23 mg/dL   Creatinine, Ser 1.61 (*) 0.50 - 1.10 mg/dL   Calcium 9.3  8.4 - 09.6 mg/dL   GFR calc non Af Amer >90  >90 mL/min   GFR calc Af Amer >90  >90 mL/min   Comment: (NOTE)     The eGFR has been calculated using the CKD EPI equation.     This calculation has not been validated in all clinical situations.     eGFR's persistently <90 mL/min signify possible Chronic Kidney  Disease.  MAGNESIUM     Status: None   Collection Time    10/07/12  6:00 AM      Result Value Range   Magnesium 1.9  1.5 - 2.5 mg/dL  PHOSPHORUS     Status: None   Collection Time    10/07/12  6:00 AM      Result Value Range   Phosphorus 4.2  2.3 - 4.6 mg/dL  CBC     Status: Abnormal   Collection Time    10/07/12  6:00 AM      Result Value Range   WBC 9.5  4.0 - 10.5 K/uL   RBC 3.62 (*) 3.87 - 5.11 MIL/uL   Hemoglobin 10.3 (*) 12.0 - 15.0 g/dL   HCT 16.1 (*) 09.6 - 04.5 %   MCV 84.5  78.0 - 100.0 fL   MCH 28.5  26.0 - 34.0 pg   MCHC 33.7  30.0 - 36.0 g/dL   RDW 40.9 (*) 81.1 - 91.4 %   Platelets 121 (*) 150 - 400 K/uL  PROTIME-INR     Status: None   Collection Time    10/07/12  6:00 AM      Result Value Range   Prothrombin Time 13.2  11.6 - 15.2 seconds   INR 1.02  0.00 - 1.49  APTT     Status: Abnormal   Collection Time    10/07/12  6:00 AM      Result Value Range   aPTT 42 (*) 24 - 37 seconds   Comment:            IF BASELINE aPTT IS ELEVATED,     SUGGEST PATIENT RISK ASSESSMENT     BE USED TO DETERMINE APPROPRIATE     ANTICOAGULANT THERAPY.    Dg Chest Port 1 View  10/07/2012   CLINICAL DATA:  Atelectasis.  EXAM: PORTABLE CHEST - 1 VIEW  COMPARISON:  09/25/2012  FINDINGS: Tracheostomy tube remains in good position. Central venous catheter is in the superior vena cava. Atelectasis at the right lung base has almost cleared. Left lung is clear. No pneumothorax  or effusion.  IMPRESSION: Partial clearing of slight right base atelectasis.   Electronically Signed   By: Geanie Cooley M.D.   On: 10/07/2012 07:29    Review of Systems  Unable to perform ROS: mental acuity   Blood pressure 111/84, pulse 90, temperature 98 F (36.7 C), temperature source Axillary, resp. rate 33, height 5\' 1"  (1.549 m), weight 124 lb 12.5 oz (56.6 kg), SpO2 100.00%. Physical Exam  Constitutional: She appears well-developed and well-nourished. No distress.  HENT:  Bandage is present on the scalp. There is a drain coming out the bandage.  Cardiovascular: Normal rate and regular rhythm.   GI: Soft. She exhibits no distension.  PEG tube left upper quadrant. Transverse right-sided incision is present. Right upper quadrant appears to be free of scars.  Neurological:  Eyes are open but does not respond to simple commands.  Skin: Skin is warm and dry.    Assessment/Plan: Non-obstructive hydrocephalus.  I have been asked to see her to assist with laparoscopic ventricular peritoneal shunt. I believe we can perform this despite the PEG tube and the bone flap in the right midabdomen subcutaneous tissue. If not, we can make an open incision. I attempted to speak with her father but he is not in the hospital this time and is not answering his phone. I will continue to try to speak with him in  the ensuing days.  Sally Nelson 10/07/2012, 12:02 PM

## 2012-10-07 NOTE — Progress Notes (Signed)
Occupational Therapy Treatment Patient Details Name: Sally Nelson MRN: [id number] DOB: 02-Aug-1985 Today's Date: 10/07/2012 Time:  -     OT Assessment / Plan / Recommendation  History of present illness Pt with RMCA aneurysm clipping and crani 9/11.  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     OT comments  Pt with decreased responsiveness today. Inconsistently following commands. Demonstrate increased tone proximally RUE. Increased R gaze preference. Discussed concerns with nsg. Will continue to follow.   Follow Up Recommendations  CIR    Barriers to Discharge       Equipment Recommendations  3 in 1 bedside comode;Tub/shower bench;Wheelchair (measurements OT);Wheelchair cushion (measurements OT);Hospital bed    Recommendations for Other Services Rehab consult  Frequency Min 3X/week   Progress towards OT Goals Progress towards OT goals: Progressing toward goals  Plan Discharge plan remains appropriate    Precautions / Restrictions Precautions Precautions: Fall (ventric drain) Precaution Comments: ventricular drain; clamped by RN prior to movement Restrictions Weight Bearing Restrictions: No   Pertinent Vitals/Pain Vitals stable    ADL  Eating/Feeding: NPO Grooming:  (pt using washcloth to wipe face on command . Required hand o) ADL Comments: Focus of session on facilitationg head and trunk control. Pt using RUE to assist with propping. Max assist with head control today. Following commands with decreasedconsistency. R gaze preference. L field cut.     OT Diagnosis:    OT Problem List:   OT Treatment Interventions:     OT Goals(current goals can now be found in the care plan section) Acute Rehab OT Goals Patient Stated Goal: unable to stae OT Goal Formulation: With family Time For Goal Achievement: 10/14/12 Potential to Achieve Goals: Good ADL Goals Pt Will Perform Grooming: with mod  assist;sitting Additional ADL Goal #1: sit EOB with mod A in prep for ADL retraining Additional ADL Goal #2: follow 1 step commands during ADL task with minvc Additional ADL Goal #3: Demonstrate visual scanning to locate target in L visual  field with mod cuing. Additional ADL Goal #4: Family to complete PROM LUE with min A  Visit Information  Last OT Received On: 10/07/12 Assistance Needed: +2 (+3 for transfer OOB to chair) PT/OT Co-Evaluation/Treatment: Yes (skilled intervention needed from PT/OT) History of Present Illness: Pt with RMCA aneurysm clipping and crani 9/11.  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      Subjective Data      Prior Functioning       Cognition  Cognition Arousal/Alertness: Lethargic Behavior During Therapy: Flat affect Overall Cognitive Status: Impaired/Different from baseline Area of Impairment: Following commands;Attention;Problem solving Current Attention Level: Focused Following Commands: Follows one step commands inconsistently Problem Solving: Slow processing;Decreased initiation;Difficulty sequencing;Requires verbal cues;Requires tactile cues General Comments: pt requires significant time to respond    Mobility  Bed Mobility Bed Mobility: Rolling Left;Left Sidelying to Sit;Sitting - Scoot to Edge of Bed Rolling Left: 1: +2 Total assist Rolling Left: Patient Percentage: 10% Left Sidelying to Sit: 1: +2 Total assist;HOB elevated Left Sidelying to Sit: Patient Percentage: 10% Sitting - Scoot to Edge of Bed: 1: +2 Total assist Sitting - Scoot to Edge of Bed: Patient Percentage: 0% Details for Bed Mobility Assistance: pt with some initiation of R UE for rolling. pt attempted to push up with R UE into sitting. total assist for LE management despite verbal/tactile cues. maxA for trunk elevation  Transfers Sit to Stand: 1: +2 Total assist;From bed Sit to Stand: Patient Percentage:  10% Stand to Sit: 1: +2 Total assist;To bed Stand to Sit: Patient Percentage: 10% Details for Transfer Assistance: utilized 3 muskateer lift via OT and Tech, SLP assisted with head control and line management. PT assisted with bilat LEs mvmt to maximize appropriate kinematics, weigh-shifting. Pt with no initiation of LE mvmt. Pt with noted R LE tone/increased R LE knee flexion.    Exercises  Other Exercises Other Exercises: LUE PROM Other Exercises: RUE A/AAROM Other Exercises: increased tone noted proximally in RU   Balance Balance Balance Assessed: Yes Static Sitting Balance Static Sitting - Balance Support: Feet supported;Right upper extremity supported (tactile cues/support trunk/head) Static Sitting - Level of Assistance: 2: Max assist;Other (comment) Static Sitting - Comment/# of Minutes: 15. working on lat weight shifts. no righting rxns noted today, Pt trying to use RI (Trying to use RUE at times to propr) Static Standing Balance Static Standing - Balance Support: Bilateral upper extremity supported Static Standing - Level of Assistance: 1: +2 Total assist;Patient percentage (comment) (10%) Static Standing - Comment/# of Minutes: Attempted to facilitate initiation of weight shifts in standing and upright posture using 3 musketeer apropach. Able to achieve upright posture with totoal. assist. Pt appears to be trying to push through RLE>    End of Session OT - End of Session Equipment Utilized During Treatment: Gait belt Activity Tolerance: Patient tolerated treatment well Patient left: in chair;with call bell/phone within reach;Other (comment) (with ST) Nurse Communication: Mobility status;Need for lift equipment;Other (comment) (conccerns over increased RUE tone)  GO     [name],[name] 10/07/2012, 12:44 PM [facility], OTR/L  [phone] 10/07/2012

## 2012-10-07 NOTE — Progress Notes (Signed)
PULMONARY  / CRITICAL CARE MEDICINE  Name: Sally Nelson MRN: [id number] DOB: Aug 05, 1985    ADMISSION DATE:  09/11/2012 CONSULTATION DATE:  09/11/2012  REFERRING MD :  Neurosurgery PRIMARY SERVICE: PCCM  CHIEF COMPLAINT:  Post op vent management  BRIEF PATIENT DESCRIPTION: 27 y/o with SAH s/p craniotomy and clipping of R MCA aneurysm for [facility] 9/12. PCCM continuing to follow for post op vent management.  SIGNIFICANT EVENTS / STUDIES:  9/12 OR >>> Craniotomy, clipping R MCA aneurysm for a [facility] 9/12 Head CT >>> Interval development R SDH, effacement basilar cisterns, evolving R MCA CVA, midline shift 14mm 9/23 PEG Tube, tracheostomy placement 10/1 CT head: increasing hydrocephalus, persistent herniation of right frontal and temporal lobes, mild intraventricular hemorrhage 10/6 scheduled for VP shunt placement. continues to have ventriculostomy drainage. Trach in place  LINES / TUBES: ETT 9/12 >>9/23  [place]  9/12 >>> PEG 9/23 >>> Tracheostomy (jy) 9/23 >>> Ventriculostomy 9/19 >>>  CULTURES: 9/11 MRSA>>> neg 9/12 Respirstory >>> gram neg cocci, non-pathogenic flora 9/12 Blood >>> neg 9/17 Urine >>> neg 9/18 Blood >>> neg 9/22Blood >>>Neg 9/22 Urine >>> E. Coli sensitive to Zosyn 9/22 CSF >>> neg 9/22 Respiratory >>>neg  ANTIBIOTICS: Unasyn 9/12 >>> 9/20 Vancomycin 9/22 >>> 9/29 Zosyn 9/22 >>> 9/29  SUBJECTIVE:  No acute issues overnight  VITAL SIGNS: Temp:  [97.9 F (36.6 C)-98.9 F (37.2 C)] 98.2 F (36.8 C) (10/07 0400) Pulse Rate:  [56-96] 64 (10/07 0700) Resp:  [8-23] 13 (10/07 0700) BP: (99-129)/(51-89) 109/67 mmHg (10/07 0700) SpO2:  [95 %-100 %] 100 % (10/07 0700) FiO2 (%):  [28 %] 28 % (10/07 0400) Weight:  [124 lb 12.5 oz (56.6 kg)] 124 lb 12.5 oz (56.6 kg) (10/07 0500)  VENTILATOR SETTINGS: Vent Mode:  [-]  FiO2 (%):  [28 %] 28 %  INTAKE / OUTPUT: Intake/Output     10/06 0701 - 10/07 0700 10/07 0701 - 10/08 0700   I.V. (mL/kg) 3958.7 (69.9)     Other 100    NG/GT 900    Total Intake(mL/kg) 4958.7 (87.6)    Urine (mL/kg/hr) 5670 (4.2)    Drains 84 (0.1)    Total Output 5754     Net -795.3           PHYSICAL EXAMINATION: Gen: chronically ill appearing female, no distress, alert Neuro: RASS -1, non- verbal,left hemiparesis, eyes will follow  HEENT: Ventriculostomy in place, tracheostomy in place, PERRL PULM: clear to auscultation bilaterally  CV: Regular, Systolic/diastolic murmur  AB: Soft abdomen, + BS, PEG in place Ext: No edema, skin warm and dry   Recent Labs Lab 10/01/12 0500 10/02/12 0500 10/03/12 0500 10/04/12 0429 10/07/12 0600  HGB 10.3* 10.5* 9.8* 9.7* 10.3*  WBC 11.2* 11.1* 9.1 8.8 9.5  PLT 173 177 163 146* 121*  NA 138 139 137 142 143  K 3.1* 4.0 3.8 4.1 3.1*  CL 99 101 100 104 98  CO2 32 30 31 30  38*  GLUCOSE 123* 124* 116* 125* 103*  BUN 17 16 22 20 19   CREATININE 0.31* 0.33* 0.33* 0.33* 0.36*  CALCIUM 9.2 9.0 9.0 8.9 9.3  MG 2.0 2.0 2.1 1.9 1.9  PHOS 3.7 4.1 3.9 3.7 4.2  INR  --   --   --   --  1.02  APTT  --   --   --   --  42*    Recent Labs Lab 10/05/12 1942 10/05/12 2356 10/06/12 0356 10/06/12 0803 10/06/12 1127  GLUCAP 95 88  83 92 112*   CXR:  ASSESSMENT / PLAN:  PULMONARY A:  Post op respiratory failure- S/p tracheostomy Asthma-stable P:   -Goal SpO2>92 -Trach collar as tolerated, will attempt to change to cuffless, but not until goes to OR for shunt  -albuterol PRN -avoid gross pos balance -continue  Lasix had good neg balance with this  CARDIOVASCULAR A: 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), required surgery as an infant Chronic diastolic heart failure HTN P:  - Metoprolol PRN - continue Zocor -continue lasix  RENAL A:   Suspected cerebral salt waisting-stabilized on Fludrocortisone, resolved Hypo k , mag P:   -Trend BMP -NS@100 , consider kvo -Avoid free water with cerebral edema -Consider dc fludrocortisone  GASTROINTESTINAL/ GU A:    Dysphagia- s/p PEG placement UTI- resolved, treated with zosyn P:   -TF -PPI for SUP  -Oxybutynin   HEMATOLOGIC A:   Anemia P:  -continue Heparin SQ for DVT , may need to be held for shunt -consider transfusion if hgb < 7   INFECTIOUS A:  Suspected aspiration pneumonia   UTI- resolved, + for e.coli treated with Zosyn  P:   -Completed abx as above  ENDOCRINE A:   Steroid induced hyperglycemia, resolved.   Suspected adrenal insufficiency P:   -Glycemic Control Protocol, Phase 1  NEUROLOGIC A:   SAH- s/p R MCA clipping c/b R MCA territory CVA, edema and effacement P:   -Neurosurgery following -VP shunt tentatively planned for Thursday  - continue Keppra  - continue Fentanyl PRN  - continue to monitor ventriculostomy drainage  - Daily WUA  Nathanial Rancher PA- Student [facility]   I have personally obtained history, examined patient, evaluated and interpreted laboratory and imaging results, reviewed medical records, formulated assessment / plan and placed orders.   10/07/2012, 7:34 AM Ccm time 30 min   Mcarthur Rossetti. Tyson Alias, MD, FACP Pgr: [phone] [facility]

## 2012-10-08 LAB — URINALYSIS, ROUTINE W REFLEX MICROSCOPIC
Bilirubin Urine: NEGATIVE
Nitrite: POSITIVE — AB
Specific Gravity, Urine: 1.02 (ref 1.005–1.030)
Urobilinogen, UA: 4 mg/dL — ABNORMAL HIGH (ref 0.0–1.0)
pH: 8 (ref 5.0–8.0)

## 2012-10-08 LAB — URINE MICROSCOPIC-ADD ON

## 2012-10-08 LAB — BASIC METABOLIC PANEL
BUN: 19 mg/dL (ref 6–23)
CO2: 37 mEq/L — ABNORMAL HIGH (ref 19–32)
Calcium: 9.6 mg/dL (ref 8.4–10.5)
Chloride: 98 mEq/L (ref 96–112)
Chloride: 99 mEq/L (ref 96–112)
GFR calc Af Amer: >90 mL/min (ref >90)
GFR calc Af Amer: >90 mL/min (ref >90)
GFR calc non Af Amer: >90 mL/min (ref >90)
GFR calc non Af Amer: >90 mL/min (ref >90)
Glucose, Bld: 113 mg/dL — ABNORMAL HIGH (ref 70–99)
Potassium: 4.1 mEq/L (ref 3.5–5.1)
Potassium: 4.2 mEq/L (ref 3.5–5.1)
Sodium: 139 mEq/L (ref 135–145)
Sodium: 142 mEq/L (ref 135–145)

## 2012-10-08 MED ORDER — FUROSEMIDE 10 MG/ML IJ SOLN
40.0000 mg | Freq: Two times a day (BID) | INTRAMUSCULAR | Status: DC
  Administered 2012-10-08: 40 mg via INTRAVENOUS
  Filled 2012-10-08 (×3): qty 4

## 2012-10-08 NOTE — Progress Notes (Signed)
Physical Therapy Treatment Patient Details Name: Sally Nelson MRN: [id number] DOB: 06/26/1985 Today's Date: 10/08/2012 Time: 4098-1191 PT Time Calculation (min): 33 min  PT Assessment / Plan / Recommendation  History of Present Illness Pt with RMCA aneurysm clipping and crani 9/11.  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     PT Comments   Pt with minimal functional mobility progress this date. Pt may need SNF placement upon d/c if pt does not progress from mobility stand point. Pt may have shunt placed tomorrow and PT will con't to assess pt mobility.   Follow Up Recommendations  CIR;Supervision/Assistance - 24 hour (however may need SNF if pt doesn't progress)     Does the patient have the potential to tolerate intense rehabilitation     Barriers to Discharge        Equipment Recommendations       Recommendations for Other Services    Frequency Min 3X/week   Progress towards PT Goals Progress towards PT goals: Not progressing toward goals - comment  Plan Current plan remains appropriate    Precautions / Restrictions Precautions Precautions: Fall Precaution Comments: ventricular drain; clamped by RN prior to movement Restrictions Weight Bearing Restrictions: No   Pertinent Vitals/Pain stable    Mobility  Bed Mobility Bed Mobility: Rolling Left;Left Sidelying to Sit;Sitting - Scoot to [place] of Bed Rolling Left: 1: +2 Total assist Rolling Left: Patient Percentage: 10% Left Sidelying to Sit: 1: +2 Total assist;HOB elevated Left Sidelying to Sit: Patient Percentage: 10% Sitting - Scoot to Edge of Bed: 1: +2 Total assist Sitting - Scoot to Edge of Bed: Patient Percentage: 0% Sit to Supine: 1: +2 Total assist;HOB flat Sit to Supine: Patient Percentage: 0% Details for Bed Mobility Assistance: pt requires to con't assist for trunk elevation and LE mangement.pt remains unable to maintain head control as  well Transfers Transfers: Not assessed Ambulation/Gait Ambulation/Gait Assistance: Not tested (comment) Modified Rankin (Stroke Patients Only) Pre-Morbid Rankin Score: No significant disability Modified Rankin: Severe disability    Exercises General Exercises - Lower Extremity Ankle Circles/Pumps: PROM;Both;10 reps;Seated Long Arc Quad: PROM;Both;10 reps;Seated   PT Diagnosis:    PT Problem List:   PT Treatment Interventions:     PT Goals (current goals can now be found in the care plan section)    Visit Information  Last PT Received On: 10/08/12 Assistance Needed: +2 History of Present Illness: Pt with RMCA aneurysm clipping and crani 9/11.  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      Subjective Data      Cognition  Cognition Arousal/Alertness: Lethargic Behavior During Therapy: Flat affect Overall Cognitive Status: Impaired/Different from baseline Area of Impairment: Following commands;Attention;Problem solving Current Attention Level: Focused Following Commands: Follows one step commands inconsistently Problem Solving: Slow processing;Decreased initiation;Difficulty sequencing;Requires verbal cues;Requires tactile cues General Comments: pt requires significant time to respond    Balance  Balance Balance Assessed: Yes Static Sitting Balance Static Sitting - Balance Support: Bilateral upper extremity supported Static Sitting - Level of Assistance: 1: +1 Total assist Static Sitting - Comment/# of Minutes: 17 min, worked on R UE and LE mvmt. Pt with not active R LE mvmt. Pt with inconsitant command follow with R UE. Pt able to mimic PTs  hand, wave and reach with R UE. pt con't to require total assist to maintain head in upright position. Attempted to activate  R tricep contaction however unsuccesfull. Pt con't to have R gaze as well  End of Session PT - End of Session Equipment Utilized During Treatment:  Oxygen Activity Tolerance: Patient limited by lethargy Patient left: in bed;with call bell/phone within reach;with family/visitor present Nurse Communication: Mobility status;Need for lift equipment   GP     Marcene Brawn 10/08/2012, 3:57 PM  Lewis Shock, PT, DPT Pager #: [phone] Office #: [phone]

## 2012-10-08 NOTE — Progress Notes (Signed)
Orthopedic Tech Progress Note Patient Details:  Sally Nelson 01/01/1986 [id number]  Patient ID: Sally Nelson, female   DOB: 05/01/1985, 27 y.o.   MRN: [id number]   Sally Nelson 10/08/2012, 12:34 PMCalled bio-tech for bilateral prafos.

## 2012-10-08 NOTE — Progress Notes (Signed)
eLink Physician-Brief Progress Note Patient Name: Sally Nelson DOB: 1985/12/27 MRN: [id number]  Date of Service  10/08/2012   HPI/Events of Note   Pt with abn u/a ??UTI??    eICU Interventions  F/u urine c/s, get foley out asap Hold on abx as of yet   Intervention Category Major Interventions: Infection - evaluation and management  Shan Levans 10/08/2012, 5:01 PM

## 2012-10-08 NOTE — Progress Notes (Signed)
Dr. Delford Field made aware of pt's UA and pending urine culture. No orders received at this time.

## 2012-10-08 NOTE — Progress Notes (Signed)
26 Days Post-Op  Subjective: Awake on trach collar  Objective: Vital signs in last 24 hours: Temp:  [97.9 F (36.6 C)-98.4 F (36.9 C)] 98.1 F (36.7 C) (10/08 0400) Pulse Rate:  [64-90] 79 (10/08 0700) Resp:  [0-33] 24 (10/08 0700) BP: (97-129)/(51-84) 124/77 mmHg (10/08 0700) SpO2:  [98 %-100 %] 100 % (10/08 0700) FiO2 (%):  [28 %] 28 % (10/08 0400) Weight:  [116 lb 6.5 oz (52.8 kg)] 116 lb 6.5 oz (52.8 kg) (10/08 0355) Last BM Date: 10/06/12  Intake/Output from previous day: 10/07 0701 - 10/08 0700 In: 1970 [I.V.:480; NG/GT:1440; IV Piggyback:50] Out: 4575 [Urine:4550; Drains:25] Intake/Output this shift:    PE: General- In NAD Abdomen-soft, PEG in LUQ, wound in right abdomen  Lab Results:   Recent Labs  10/07/12 0600  WBC 9.5  HGB 10.3*  HCT 30.6*  PLT 121*   BMET  Recent Labs  10/07/12 0600 10/07/12 2345  NA 143 142  K 3.1* 4.1  CL 98 99  CO2 38* 37*  GLUCOSE 103* 120*  BUN 19 19  CREATININE 0.36* 0.34*  CALCIUM 9.3 9.6   PT/INR  Recent Labs  10/07/12 0600  LABPROT 13.2  INR 1.02   Comprehensive Metabolic Panel:    Component Value Date/Time   NA 142 10/07/2012 2345   K 4.1 10/07/2012 2345   CL 99 10/07/2012 2345   CO2 37* 10/07/2012 2345   BUN 19 10/07/2012 2345   CREATININE 0.34* 10/07/2012 2345   GLUCOSE 120* 10/07/2012 2345   CALCIUM 9.6 10/07/2012 2345   AST 36 09/25/2012 0400   ALT 29 09/25/2012 0400   ALKPHOS 60 09/25/2012 0400   BILITOT 0.6 09/25/2012 0400   PROT 7.3 09/25/2012 0400   ALBUMIN 2.5* 09/25/2012 0400     Studies/Results: Dg Chest Port 1 View  10/07/2012   CLINICAL DATA:  Atelectasis.  EXAM: PORTABLE CHEST - 1 VIEW  COMPARISON:  09/25/2012  FINDINGS: Tracheostomy tube remains in good position. Central venous catheter is in the superior vena cava. Atelectasis at the right lung base has almost cleared. Left lung is clear. No pneumothorax or effusion.  IMPRESSION: Partial clearing of slight right base atelectasis.    Electronically Signed   By: Geanie Cooley M.D.   On: 10/07/2012 07:29    Anti-infectives: Anti-infectives   Start     Dose/Rate Route Frequency Ordered Stop   09/29/12 1115  fluconazole (DIFLUCAN) 40 MG/ML suspension 200 mg     200 mg Per Tube Daily 09/29/12 1005 10/01/12 1100   09/27/12 0000  vancomycin (VANCOCIN) 1,250 mg in sodium chloride 0.9 % 250 mL IVPB  Status:  Discontinued     1,250 mg 166.7 mL/hr over 90 Minutes Intravenous Every 8 hours 09/26/12 2050 09/30/12 0949   09/25/12 1800  vancomycin (VANCOCIN) IVPB 1000 mg/200 mL premix  Status:  Discontinued     1,000 mg 200 mL/hr over 60 Minutes Intravenous Every 8 hours 09/25/12 1319 09/26/12 2050   09/23/12 0200  vancomycin (VANCOCIN) 500 mg in sodium chloride 0.9 % 100 mL IVPB  Status:  Discontinued     500 mg 100 mL/hr over 60 Minutes Intravenous Every 8 hours 09/22/12 1743 09/25/12 1319   09/22/12 1800  piperacillin-tazobactam (ZOSYN) IVPB 3.375 g  Status:  Discontinued     3.375 g 12.5 mL/hr over 240 Minutes Intravenous Every 8 hours 09/22/12 1744 09/30/12 0949   09/22/12 1745  vancomycin (VANCOCIN) IVPB 750 mg/150 ml premix     750 mg 150  mL/hr over 60 Minutes Intravenous  Once 09/22/12 1743 09/22/12 1907   09/12/12 1053  bacitracin 50,000 Units in sodium chloride irrigation 0.9 % 500 mL irrigation  Status:  Discontinued       As needed 09/12/12 1054 09/12/12 1212   09/12/12 0800  Ampicillin-Sulbactam (UNASYN) 3 g in sodium chloride 0.9 % 100 mL IVPB  Status:  Discontinued     3 g 100 mL/hr over 60 Minutes Intravenous Every 8 hours 09/12/12 0152 09/12/12 0746   09/12/12 0800  Ampicillin-Sulbactam (UNASYN) 3 g in sodium chloride 0.9 % 100 mL IVPB  Status:  Discontinued     3 g 100 mL/hr over 60 Minutes Intravenous 4 times per day 09/12/12 0746 09/21/12 1155   09/12/12 0200  Ampicillin-Sulbactam (UNASYN) 3 g in sodium chloride 0.9 % 100 mL IVPB     3 g 100 mL/hr over 60 Minutes Intravenous  Once 09/12/12 0152 09/12/12 0336    09/11/12 2308  ceFAZolin (ANCEF) 1-5 GM-% IVPB    Comments:  Aydelette, Jamie   : cabinet override      09/11/12 2308 09/12/12 1114   09/11/12 1945  bacitracin 50,000 Units in sodium chloride irrigation 0.9 % 500 mL irrigation  Status:  Discontinued       As needed 09/11/12 2050 09/12/12 0030   09/11/12 1923  ceFAZolin (ANCEF) 2-3 GM-% IVPB SOLR    Comments:  Toney Sang   : cabinet override      09/11/12 1923 09/11/12 2310      Assessment Principal Problem:   SAH (subarachnoid hemorrhage) Active Problems:   Nonobstructive hydrocephalus   Tracheostomy status    LOS: 27 days   Plan: Laparoscopic assisted vp shunt placement tomorrow.  I discussed the procedure and risks (including but not limited to infection, bleeding, shunt malfunction, wound problems, injury to intraabdominal organs) with her father and he seems to understand.   Jakeim Sedore J 10/08/2012

## 2012-10-08 NOTE — Progress Notes (Signed)
Pt seen and examined. No issues overnight. Pt seen by Dr. Abbey Chatters this am for VPS tomorrow.  EXAM: Temp:  [97.9 F (36.6 C)-98.4 F (36.9 C)] 98.3 F (36.8 C) (10/08 0800) Pulse Rate:  [64-90] 79 (10/08 0800) Resp:  [0-33] 12 (10/08 0800) BP: (97-129)/(51-84) 115/70 mmHg (10/08 0800) SpO2:  [98 %-100 %] 100 % (10/08 0854) FiO2 (%):  [28 %] 28 % (10/08 0854) Weight:  [52.8 kg (116 lb 6.5 oz)] 52.8 kg (116 lb 6.5 oz) (10/08 0355) Intake/Output     10/07 0701 - 10/08 0700 10/08 0701 - 10/09 0700   I.V. (mL/kg) 480 (9.1) 20 (0.4)   Other     NG/GT 1440 60   IV Piggyback 50    Total Intake(mL/kg) 1970 (37.3) 80 (1.5)   Urine (mL/kg/hr) 4550 (3.6) 100 (1)   Drains 25 (0) 22 (0.2)   Total Output 4575 122   Net -2605 -42         EVD in place, 22cc x 24hrs, ICP -3 - 6 Awake, alert Trached, on collar Follows commands RUE/RLE Wounds c/d/i  LABS: Lab Results  Component Value Date   CREATININE 0.34* 10/07/2012   BUN 19 10/07/2012   NA 142 10/07/2012   K 4.1 10/07/2012   CL 99 10/07/2012   CO2 37* 10/07/2012   Lab Results  Component Value Date   WBC 9.5 10/07/2012   HGB 10.3* 10/07/2012   HCT 30.6* 10/07/2012   MCV 84.5 10/07/2012   PLT 121* 10/07/2012   IMPRESSION: - 27 y.o. female SAH d# 27 s/p RMCA clipping and craniectomy, shunt dependent  PLAN: - lap assisted VPS tomorrow.  - Clamp EVD tonight at [place] - stop TF tomorrow am - Labs reviewed

## 2012-10-08 NOTE — Progress Notes (Signed)
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 PCCM continuing to follow for post op vent management.  SIGNIFICANT EVENTS / STUDIES:  9/12 OR: Craniotomy, clipping R MCA aneurysm for a [facility] 9/12 Head CT: Interval development R SDH, effacement basilar cisterns, evolving R MCA CVA, midline shift 14mm 9/23 PEG Tube, tracheostomy placement 10/1 CT head: increasing hydrocephalus, persistent herniation of right frontal and temporal lobes, mild intraventricular hemorrhage 10/6 scheduled for VP shunt placement Thursday. Continues to have ventriculostomy drainage. Trach in place 10/8 VPS shunt scheduled for tomorrow  LINES / TUBES: ETT 9/12 >>9/23  L [place]  9/12 >>> PEG 9/23 >>> Tracheostomy (jy) 9/23 >>> Ventriculostomy 9/19 >>>  CULTURES: 9/11 MRSA>>> neg 9/12 Respirstory >>> gram neg cocci, non-pathogenic flora 9/12 Blood >>> neg 9/17 Urine >>> neg 9/18 Blood >>> neg 9/22Blood >>>Neg 9/22 Urine >>> E. Coli sensitive to Zosyn 9/22 CSF >>> neg 9/22 Respiratory >>>neg  ANTIBIOTICS: Unasyn 9/12 >>> 9/20 Vancomycin 9/22 >>> 9/29 Zosyn 9/22 >>> 9/29  SUBJECTIVE:  No acute issues overnight. Per pts nurse she is able to move right extremity with prompting and will move left to pain.  Neg 2.6 liters  VITAL SIGNS: Temp:  [97.9 F (36.6 C)-98.4 F (36.9 C)] 98.1 F (36.7 C) (10/08 0400) Pulse Rate:  [64-90] 72 (10/08 0600) Resp:  [0-33] 13 (10/08 0600) BP: (97-129)/(51-84) 117/63 mmHg (10/08 0600) SpO2:  [98 %-100 %] 100 % (10/08 0600) FiO2 (%):  [28 %] 28 % (10/08 0400) Weight:  [116 lb 6.5 oz (52.8 kg)] 116 lb 6.5 oz (52.8 kg) (10/08 0355)  VENTILATOR SETTINGS: Vent Mode:   [-]  FiO2 (%):  [28 %] 28 %  INTAKE / OUTPUT: Intake/Output     10/07 0701 - 10/08 0700 10/08 0701 - 10/09 0700   I.V. (mL/kg) 460 (8.7)    Other     NG/GT 1380    IV Piggyback 50    Total Intake(mL/kg) 1890 (35.8)    Urine (mL/kg/hr) 4550 (3.6)    Drains 25 (0)    Total Output 4575     Net -2685           PHYSICAL EXAMINATION: Gen: chronically ill appearing female, no distress, alert Neuro: RASS -1, non- verbal, left hemiparesis HEENT: Ventriculostomy in place, tracheostomy in place, PERRL PULM: clear to auscultation bilaterally, tachepnea  CV: Regular, Systolic/diastolic murmur  AB: Soft abdomen, + BS, PEG in place, cloudy urine output Ext: No edema, skin warm and dry   Recent Labs Lab 10/02/12 0500 10/03/12 0500 10/04/12 0429 10/07/12 0600 10/07/12 2345  HGB 10.5* 9.8* 9.7* 10.3*  --   WBC 11.1* 9.1 8.8 9.5  --   PLT 177 163 146* 121*  --   NA 139 137 142 143 142  K 4.0 3.8 4.1 3.1* 4.1  CL 101 100 104 98 99  CO2 30 31 30  38* 37*  GLUCOSE 124* 116* 125* 103* 120*  BUN 16 22 20 19 19   CREATININE 0.33* 0.33* 0.33* 0.36* 0.34*  CALCIUM 9.0 9.0 8.9 9.3 9.6  MG 2.0 2.1 1.9 1.9  --   PHOS 4.1 3.9 3.7 4.2  --   INR  --   --   --  1.02  --   APTT  --   --   --  42*  --     Recent Labs Lab 10/05/12 1942 10/05/12 2356 10/06/12 0356 10/06/12 0803 10/06/12 1127  GLUCAP 95 88 83 92 112*   CXR 10/7: improved right basilar atelectasis/edema  ASSESSMENT / PLAN:  PULMONARY A:  Post op respiratory failure- S/p tracheostomy Asthma-stable P:   -Goal SpO2>92 -Trach collar as tolerated, will attempt to change to cuffless 24 hrs post op shunt -albuterol PRN -continue Lasix, maintaining good neg balance with this  CARDIOVASCULAR A: 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), required surgery as an infant Chronic diastolic heart failure HTN P:  - Metoprolol PRN - continue Zocor -continue lasix  RENAL A:   Suspected cerebral salt  waisting-stabilized on Fludrocortisone, resolved Hypokalemia- resolved P:   -Trend BMP now , may alter lasix dosing -kvo maintain -Avoid free water with cerebral edema UA  GASTROINTESTINAL/ GU A:   Dysphagia- s/p PEG placement UTI- resolved P:   - continue TF, will need to hold for VPS placement  -PPI for SUP  -Oxybutynin  - consider d/c foley, currently still high uo,and to OR,maintain 24 hrs further  HEMATOLOGIC A:   Anemia Hemoconcentration P:  -continue Heparin SQ for DVT , may need to be held for shunt -review coags prior to VPS  INFECTIOUS A:  Suspected aspiration pneumonia   UTI- resolved P:   -UA as cloudy urine, no culture unless UA pos -monitor CBC and fever curve   ENDOCRINE A:   Steroid induced hyperglycemia, resolved.   Suspected adrenal insufficiency P:   -Glycemic Control Protocol, Phase 1  NEUROLOGIC A:   SAH- s/p R MCA clipping c/b R MCA territory CVA, edema and effacement Hydrocephalus P:   -Neurosurgery following -VP shunt planned for 10/9 - continue Keppra  - continue Fentanyl PRN  - continue to monitor ventriculostomy drainage  -continue PT/OT  Nathanial Rancher PA- Student [facility]   I have personally obtained history, examined patient, evaluated and interpreted laboratory and imaging results, reviewed medical records, formulated assessment / plan and placed orders.   10/08/2012, 7:17 AM Ccm time 30 min   Mcarthur Rossetti. Tyson Alias, MD, FACP Pgr: [phone] [facility]

## 2012-10-09 ENCOUNTER — Encounter (HOSPITAL_COMMUNITY): Payer: Self-pay | Admitting: Anesthesiology

## 2012-10-09 ENCOUNTER — Encounter (HOSPITAL_COMMUNITY): Payer: Medicaid Other | Admitting: Anesthesiology

## 2012-10-09 ENCOUNTER — Inpatient Hospital Stay (HOSPITAL_COMMUNITY): Payer: Medicaid Other | Admitting: Anesthesiology

## 2012-10-09 ENCOUNTER — Encounter (HOSPITAL_COMMUNITY)
Admission: EM | Disposition: A | Discharge status: Hospice | Payer: Self-pay | Source: Other Acute Inpatient Hospital | Attending: Neurosurgery

## 2012-10-09 DIAGNOSIS — G911 Obstructive hydrocephalus: Secondary | ICD-10-CM

## 2012-10-09 HISTORY — PX: LAPAROSCOPIC REVISION VENTRICULAR-PERITONEAL (V-P) SHUNT: SHX5924

## 2012-10-09 HISTORY — PX: VENTRICULOPERITONEAL SHUNT: SHX204

## 2012-10-09 LAB — BASIC METABOLIC PANEL
BUN: 23 mg/dL (ref 6–23)
CO2: 38 mEq/L — ABNORMAL HIGH (ref 19–32)
Chloride: 97 mEq/L (ref 96–112)
GFR calc Af Amer: >90 mL/min (ref >90)
GFR calc non Af Amer: >90 mL/min (ref >90)
Glucose, Bld: 116 mg/dL — ABNORMAL HIGH (ref 70–99)
Potassium: 3.9 mEq/L (ref 3.5–5.1)
Sodium: 140 mEq/L (ref 135–145)

## 2012-10-09 LAB — COMPREHENSIVE METABOLIC PANEL
ALT: 43 U/L — ABNORMAL HIGH (ref 0–35)
BUN: 22 mg/dL (ref 6–23)
CO2: 35 mEq/L — ABNORMAL HIGH (ref 19–32)
Calcium: 10.1 mg/dL (ref 8.4–10.5)
Chloride: 97 mEq/L (ref 96–112)
Creatinine, Ser: 0.38 mg/dL — ABNORMAL LOW (ref 0.50–1.10)
GFR calc Af Amer: >90 mL/min (ref >90)
GFR calc non Af Amer: >90 mL/min (ref >90)
Glucose, Bld: 99 mg/dL (ref 70–99)
Sodium: 139 mEq/L (ref 135–145)
Total Bilirubin: 0.5 mg/dL (ref 0.3–1.2)

## 2012-10-09 LAB — CBC
HCT: 32.3 % — ABNORMAL LOW (ref 36.0–46.0)
Hemoglobin: 11.1 g/dL — ABNORMAL LOW (ref 12.0–15.0)
Hemoglobin: 11.4 g/dL — ABNORMAL LOW (ref 12.0–15.0)
MCHC: 34.4 g/dL (ref 30.0–36.0)
Platelets: 105 10*3/uL — ABNORMAL LOW (ref 150–400)
RBC: 3.94 MIL/uL (ref 3.87–5.11)
RDW: 19.1 % — ABNORMAL HIGH (ref 11.5–15.5)
WBC: 8.8 10*3/uL (ref 4.0–10.5)
WBC: 9.5 10*3/uL (ref 4.0–10.5)

## 2012-10-09 LAB — PROTIME-INR: INR: 0.99 (ref 0.00–1.49)

## 2012-10-09 LAB — APTT: aPTT: 29 seconds (ref 24–37)

## 2012-10-09 SURGERY — SHUNT INSERTION VENTRICULAR-PERITONEAL
Anesthesia: General | Site: Head | Wound class: Clean

## 2012-10-09 MED ORDER — ROCURONIUM BROMIDE 100 MG/10ML IV SOLN
INTRAVENOUS | Status: DC | PRN
  Administered 2012-10-09: 5 mg via INTRAVENOUS
  Administered 2012-10-09 (×2): 10 mg via INTRAVENOUS
  Administered 2012-10-09: 25 mg via INTRAVENOUS

## 2012-10-09 MED ORDER — OXYCODONE HCL 5 MG/5ML PO SOLN: 5.0000 mg | Freq: Once | ORAL | Status: DC | PRN

## 2012-10-09 MED ORDER — BUPIVACAINE-EPINEPHRINE PF 0.25-1:200000 % IJ SOLN
INTRAMUSCULAR | Status: AC
  Filled 2012-10-09: qty 30

## 2012-10-09 MED ORDER — GLYCOPYRROLATE 0.2 MG/ML IJ SOLN
INTRAMUSCULAR | Status: DC | PRN
  Administered 2012-10-09: .5 mg via INTRAVENOUS

## 2012-10-09 MED ORDER — MIDAZOLAM HCL 5 MG/5ML IJ SOLN
INTRAMUSCULAR | Status: DC | PRN
  Administered 2012-10-09: 2 mg via INTRAVENOUS

## 2012-10-09 MED ORDER — SODIUM CHLORIDE 0.9 % IR SOLN
Status: DC | PRN
  Administered 2012-10-09: 17:00:00

## 2012-10-09 MED ORDER — ONDANSETRON HCL 4 MG/2ML IJ SOLN
INTRAMUSCULAR | Status: DC | PRN
  Administered 2012-10-09: 4 mg via INTRAMUSCULAR

## 2012-10-09 MED ORDER — FENTANYL CITRATE 0.05 MG/ML IJ SOLN: 50.0000 ug | INTRAMUSCULAR | Status: DC | PRN

## 2012-10-09 MED ORDER — SODIUM CHLORIDE 0.9 % IR SOLN
Status: DC | PRN
  Administered 2012-10-09: 1000 mL

## 2012-10-09 MED ORDER — MEPERIDINE HCL 25 MG/ML IJ SOLN: 6.2500 mg | INTRAMUSCULAR | Status: DC | PRN

## 2012-10-09 MED ORDER — THROMBIN 5000 UNITS EX SOLR
CUTANEOUS | Status: DC | PRN
  Administered 2012-10-09: 5000 [IU] via TOPICAL

## 2012-10-09 MED ORDER — OXYCODONE HCL 5 MG PO TABS: 5.0000 mg | ORAL_TABLET | Freq: Once | ORAL | Status: DC | PRN

## 2012-10-09 MED ORDER — MIDAZOLAM HCL 2 MG/2ML IJ SOLN: 0.5000 mg | Freq: Once | INTRAMUSCULAR | Status: DC | PRN

## 2012-10-09 MED ORDER — NEOSTIGMINE METHYLSULFATE 1 MG/ML IJ SOLN
INTRAMUSCULAR | Status: DC | PRN
  Administered 2012-10-09: 3 mg via INTRAVENOUS

## 2012-10-09 MED ORDER — FENTANYL CITRATE 0.05 MG/ML IJ SOLN
INTRAMUSCULAR | Status: DC | PRN
  Administered 2012-10-09 (×3): 50 ug via INTRAVENOUS
  Administered 2012-10-09: 100 ug via INTRAVENOUS

## 2012-10-09 MED ORDER — CEFAZOLIN SODIUM 1-5 GM-% IV SOLN
INTRAVENOUS | Status: AC
Start: 2012-10-09 — End: 2012-10-10
  Filled 2012-10-09: qty 100

## 2012-10-09 MED ORDER — LACTATED RINGERS IV SOLN
INTRAVENOUS | Status: DC | PRN
  Administered 2012-10-09: 14:00:00 via INTRAVENOUS

## 2012-10-09 MED ORDER — CEFAZOLIN SODIUM-DEXTROSE 2-3 GM-% IV SOLR
INTRAVENOUS | Status: DC | PRN
  Administered 2012-10-09: 2 g via INTRAVENOUS

## 2012-10-09 MED ORDER — MIDAZOLAM HCL 2 MG/2ML IJ SOLN: 0.5000 mg | INTRAMUSCULAR | Status: DC | PRN

## 2012-10-09 MED ORDER — PROMETHAZINE HCL 25 MG/ML IJ SOLN: 6.2500 mg | INTRAMUSCULAR | Status: DC | PRN

## 2012-10-09 MED ORDER — FENTANYL CITRATE 0.05 MG/ML IJ SOLN: 25.0000 ug | INTRAMUSCULAR | Status: DC | PRN

## 2012-10-09 MED ORDER — HEMOSTATIC AGENTS (NO CHARGE) OPTIME
TOPICAL | Status: DC | PRN
  Administered 2012-10-09: 1 via TOPICAL

## 2012-10-09 MED ORDER — PROPOFOL 10 MG/ML IV BOLUS
INTRAVENOUS | Status: DC | PRN
  Administered 2012-10-09: 40 mg via INTRAVENOUS

## 2012-10-09 SURGICAL SUPPLY — 97 items
5MM BLADLESS TROCAR WITH STABILITY SLEEVE ×1 IMPLANT
5MM UNIVERSAL TROCAQR STABILITY SLEEVE ×1 IMPLANT
APL SKNCLS STERI-STRIP NONHPOA (GAUZE/BANDAGES/DRESSINGS) ×2
BENZOIN TINCTURE PRP APPL 2/3 (GAUZE/BANDAGES/DRESSINGS) ×1 IMPLANT
BLADE SURG 10 STRL SS (BLADE) ×3 IMPLANT
BLADE SURG 11 STRL SS (BLADE) ×3 IMPLANT
BLADE SURG 15 STRL LF DISP TIS (BLADE) ×2 IMPLANT
BLADE SURG 15 STRL SS (BLADE) ×3
BLADE SURG ROTATE 9660 (MISCELLANEOUS) ×6 IMPLANT
BOOT SUTURE AID YELLOW STND (SUTURE) IMPLANT
BRUSH SCRUB EZ 1% IODOPHOR (MISCELLANEOUS) ×3 IMPLANT
BUR ACORN 6.0 PRECISION (BURR) ×3 IMPLANT
CANISTER SUCTION 2500CC (MISCELLANEOUS) ×3 IMPLANT
CATH SNAP PUDENZ 5CM (CATHETERS) ×1 IMPLANT
CATH VENTRICULAR 9CM (CATHETERS) ×1 IMPLANT
CLIP RANEY DISP (INSTRUMENTS) IMPLANT
CONT SPEC 4OZ CLIKSEAL STRL BL (MISCELLANEOUS) IMPLANT
CORDS BIPOLAR (ELECTRODE) ×3 IMPLANT
COVER MAYO STAND STRL (DRAPES) ×3 IMPLANT
DECANTER SPIKE VIAL GLASS SM (MISCELLANEOUS) ×6 IMPLANT
DRAPE INCISE IOBAN 66X45 STRL (DRAPES) ×3 IMPLANT
DRAPE INCISE IOBAN 85X60 (DRAPES) ×1 IMPLANT
DRAPE ORTHO SPLIT 77X108 STRL (DRAPES) ×3
DRAPE POUCH INSTRU U-SHP 10X18 (DRAPES) ×3 IMPLANT
DRAPE PROXIMA HALF (DRAPES) ×3 IMPLANT
DRAPE SURG 17X23 STRL (DRAPES) ×2 IMPLANT
DRAPE SURG ORHT 6 SPLT 77X108 (DRAPES) ×2 IMPLANT
DRESSING TELFA 8X3 (GAUZE/BANDAGES/DRESSINGS) ×3 IMPLANT
DRSG OPSITE 4X5.5 SM (GAUZE/BANDAGES/DRESSINGS) ×6 IMPLANT
DRSG TEGADERM 4X4.75 (GAUZE/BANDAGES/DRESSINGS) ×1 IMPLANT
DURAPREP 26ML APPLICATOR (WOUND CARE) ×6 IMPLANT
ELECT BLADE 4.0 EZ CLEAN MEGAD (MISCELLANEOUS) ×3
ELECT CAUTERY BLADE 6.4 (BLADE) ×3 IMPLANT
ELECT REM PT RETURN 9FT ADLT (ELECTROSURGICAL) ×3
ELECTRODE BLDE 4.0 EZ CLN MEGD (MISCELLANEOUS) IMPLANT
ELECTRODE REM PT RTRN 9FT ADLT (ELECTROSURGICAL) ×2 IMPLANT
GAUZE SPONGE 2X2 8PLY STRL LF (GAUZE/BANDAGES/DRESSINGS) IMPLANT
GAUZE SPONGE 4X4 16PLY XRAY LF (GAUZE/BANDAGES/DRESSINGS) ×7 IMPLANT
GLOVE BIO SURGEON STRL SZ7.5 (GLOVE) ×1 IMPLANT
GLOVE BIOGEL PI IND STRL 8 (GLOVE) ×2 IMPLANT
GLOVE BIOGEL PI INDICATOR 8 (GLOVE) ×2
GLOVE ECLIPSE 7.0 STRL STRAW (GLOVE) ×4 IMPLANT
GLOVE ECLIPSE 8.0 STRL XLNG CF (GLOVE) ×4 IMPLANT
GLOVE EXAM NITRILE LRG STRL (GLOVE) IMPLANT
GLOVE EXAM NITRILE MD LF STRL (GLOVE) ×1 IMPLANT
GLOVE EXAM NITRILE XL STR (GLOVE) IMPLANT
GLOVE EXAM NITRILE XS STR PU (GLOVE) IMPLANT
GLOVE INDICATOR 7.5 STRL GRN (GLOVE) ×1 IMPLANT
GOWN BRE IMP SLV AUR LG STRL (GOWN DISPOSABLE) ×6 IMPLANT
GOWN BRE IMP SLV AUR XL STRL (GOWN DISPOSABLE) IMPLANT
GOWN STRL NON-REIN LRG LVL3 (GOWN DISPOSABLE) ×6 IMPLANT
GOWN STRL REIN 2XL LVL4 (GOWN DISPOSABLE) IMPLANT
HEMOSTAT SURGICEL 2X14 (HEMOSTASIS) IMPLANT
KIT BASIN OR (CUSTOM PROCEDURE TRAY) ×3 IMPLANT
KIT ROOM TURNOVER OR (KITS) ×3 IMPLANT
MARKER SKIN DUAL TIP RULER LAB (MISCELLANEOUS) ×3 IMPLANT
NDL HYPO 25X1 1.5 SAFETY (NEEDLE) ×2 IMPLANT
NEEDLE HYPO 25X1 1.5 SAFETY (NEEDLE) ×3 IMPLANT
NS IRRIG 1000ML POUR BTL (IV SOLUTION) ×3 IMPLANT
PACK EENT II TURBAN DRAPE (CUSTOM PROCEDURE TRAY) ×3 IMPLANT
PAD ARMBOARD 7.5X6 YLW CONV (MISCELLANEOUS) ×9 IMPLANT
PATTIES SURGICAL .5 X.5 (GAUZE/BANDAGES/DRESSINGS) IMPLANT
PENCIL BUTTON HOLSTER BLD 10FT (ELECTRODE) ×3 IMPLANT
SHEATH COOK PEEL AWAY SET 9F (SHEATH) ×4 IMPLANT
SHEATH PERITONEAL INTRO 61 (MISCELLANEOUS) ×1 IMPLANT
SLEEVE ENDOPATH XCEL 5M (ENDOMECHANICALS) ×3 IMPLANT
SLEEVE SURGEON STRL (DRAPES) ×1 IMPLANT
SPONGE GAUZE 2X2 STER 10/PKG (GAUZE/BANDAGES/DRESSINGS) ×1
SPONGE GAUZE 4X4 12PLY (GAUZE/BANDAGES/DRESSINGS) ×1 IMPLANT
SPONGE LAP 4X18 X RAY DECT (DISPOSABLE) ×3 IMPLANT
SPONGE SURGIFOAM ABS GEL 12-7 (HEMOSTASIS) IMPLANT
STAPLER SKIN PROX WIDE 3.9 (STAPLE) ×4 IMPLANT
STAPLER VISISTAT 35W (STAPLE) ×1 IMPLANT
STRIP CLOSURE SKIN 1/2X4 (GAUZE/BANDAGES/DRESSINGS) ×3 IMPLANT
STRIP CLOSURE SKIN 1/4X4 (GAUZE/BANDAGES/DRESSINGS) ×1 IMPLANT
SUT BONE WAX W31G (SUTURE) ×3 IMPLANT
SUT ETHILON 3 0 PS 1 (SUTURE) ×3 IMPLANT
SUT MON AB 4-0 PC3 18 (SUTURE) ×3 IMPLANT
SUT NURALON 4 0 TR CR/8 (SUTURE) ×1 IMPLANT
SUT SILK 0 TIES 10X30 (SUTURE) ×3 IMPLANT
SUT SILK 2 0 TIES 10X30 (SUTURE) ×1 IMPLANT
SUT SILK 3 0 SH 30 (SUTURE) IMPLANT
SUT VIC AB 2-0 CT2 18 VCP726D (SUTURE) ×3 IMPLANT
SUT VIC AB 3-0 SH 8-18 (SUTURE) ×3 IMPLANT
SYR BULB 3OZ (MISCELLANEOUS) ×3 IMPLANT
SYR CONTROL 10ML LL (SYRINGE) ×3 IMPLANT
Snap Shunt Ventricular Catheter ×1 IMPLANT
Snap Shunt Ventricular Catheter, Standard, Barium IMPLANT
Strata II Snap Shunt Assembly, Regular ×1 IMPLANT
TAPE CLOTH SURG 4X10 WHT LF (GAUZE/BANDAGES/DRESSINGS) ×1 IMPLANT
TOWEL OR 17X24 6PK STRL BLUE (TOWEL DISPOSABLE) ×3 IMPLANT
TOWEL OR 17X26 10 PK STRL BLUE (TOWEL DISPOSABLE) ×3 IMPLANT
TROCAR XCEL BLUNT TIP 100MML (ENDOMECHANICALS) IMPLANT
TROCAR XCEL NON-BLD 5MMX100MML (ENDOMECHANICALS) ×3 IMPLANT
TUBE CONNECTING 12X1/4 (SUCTIONS) ×3 IMPLANT
UNDERPAD 30X30 INCONTINENT (UNDERPADS AND DIAPERS) ×3 IMPLANT
WATER STERILE IRR 1000ML POUR (IV SOLUTION) ×3 IMPLANT

## 2012-10-09 NOTE — Progress Notes (Signed)
Occupational Therapy Treatment Patient Details Name: Sally Nelson MRN: [id number] DOB: [birth year] Today's Date: 10/09/2012 Time: 4098-1191 OT Time Calculation (min): 32 min  OT Assessment / Plan / Recommendation  History of present illness Pt with RMCA aneurysm clipping and crani 9/11.  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  Scheduled for shunt placement this pm.   OT comments  Focus of session on visual attention and scanning. L eye occluded to decrease diplopia. Pt using R hand to hold cell phone and purposefully scanning through photo album to view pictures. Pressing play button on video to watch. Good attention to tasks this session. Prefers R gaze to view/attend to functional tasks, although able to track to midline with L eye occluded. appropriately using thumbs up to answer yes/no ? Will continue to follow.  Follow Up Recommendations  CIR    Barriers to Discharge       Equipment Recommendations  3 in 1 bedside comode;Tub/shower bench;Wheelchair (measurements OT);Wheelchair cushion (measurements OT);Hospital bed    Recommendations for Other Services Rehab consult  Frequency Min 3X/week   Progress towards OT Goals Progress towards OT goals: Progressing toward goals  Plan Discharge plan remains appropriate    Precautions / Restrictions Precautions Precautions: Fall Precaution Comments: ventricular drain; clamped by RN prior to movement   Pertinent Vitals/Pain no apparent distress     ADL  Eating/Feeding: NPO ADL Comments: Focus of session on gaze stabilization, visual attention and following commands. Pt conveys diplopia in midline visual field. L eye occluded. Pt with improved scanning to midline with L eye occluded. Pt sat upright in bed with increased alertess achieved. Used pictures on cell phone for functional task. Pt attending to pictures and using R thumb to scan through pictures. Pt pressing play button  to view video. giving thumbs up that she enjoyed activity.    OT Diagnosis:    OT Problem List:   OT Treatment Interventions:     OT Goals(current goals can now be found in the care plan section) Acute Rehab OT Goals OT Goal Formulation: With family Time For Goal Achievement: 10/14/12 Potential to Achieve Goals: Good ADL Goals Pt Will Perform Grooming: with mod assist;sitting Additional ADL Goal #1: sit EOB with mod A in prep for ADL retraining Additional ADL Goal #2: follow 1 step commands during ADL task with minvc Additional ADL Goal #3: Demonstrate visual scanning to locate target in L visual  field with mod cuing. Additional ADL Goal #4: Family to complete PROM LUE with min A  Visit Information  Last OT Received On: 10/09/12 Assistance Needed: +2 History of Present Illness: Pt with RMCA aneurysm clipping and crani 9/11.  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      Subjective Data      Prior Functioning       Cognition  Cognition Arousal/Alertness: Lethargic;Awake/alert Behavior During Therapy: Flat affect Overall Cognitive Status: Impaired/Different from baseline General Comments: Increased alertness and ability to follow commands today    Mobility       Exercises  Other Exercises Other Exercises: RUE A/AAROM Other Exercises: LUE PROM   Balance     End of Session OT - End of Session Activity Tolerance: Patient tolerated treatment well Patient left: in bed;with call bell/phone within reach Nurse Communication: Mobility status;Other (comment) (progreess)  GO     Sally Nelson,Sally Nelson 10/09/2012, 2:00 PM [facility], OTR/L  [phone]  10/09/2012 

## 2012-10-09 NOTE — Transfer of Care (Signed)
Immediate Anesthesia Transfer of Care Note  Patient: Sally Nelson  Procedure(s) Performed: Procedure(s): SHUNT INSERTION VENTRICULAR-PERITONEAL (N/A) LAPAROSCOPIC REVISION VENTRICULAR-PERITONEAL (V-P) SHUNT (N/A)  Patient Location: PACU and NICU  Anesthesia Type:General  Level of Consciousness: sedated and responds to stimulation  Airway & Oxygen Therapy: Patient connected to tracheostomy mask oxygen  Post-op Assessment: Report given to PACU RN and Post -op Vital signs reviewed and stable  Post vital signs: Reviewed and stable  Complications: No apparent anesthesia complications

## 2012-10-09 NOTE — Consult Note (Addendum)
Physical Medicine and Rehabilitation Consult Reason for Consult: Right MCA aneurysm Referring Physician: Dr.Nundkumar   HPI: Sally Nelson is a 27 y.o. right-handed female admitted initially to [facility] emergency room unresponsive found to have [facility] and was transferred to [facility] for ongoing evaluation. CT and CTA reviewed demonstrating basal SAH with right greater than left sylvian clot. Right MCA aneurysm. Underwent cerebral aneurysm coiling right decompressive hemicraniectomy with implantation of bone flap in abdomen 09/12/2012. Followup critical care medicine for ventilatory support necessitating the need for tracheostomy as well as PEG tube placement 09/23/2012. On 10/01/2012 noting increased hydrocephalus persistent herniation of right frontal and temporal lobes where it laparoscopic-assisted ventriculoperitoneal shunt placement 10/09/2012. Physical and occupational therapy evaluations completed an ongoing with slow progressive gains. Patient has been fitted with bilateral [facility] prosthetics. M.D. as requested physical medicine rehabilitation consult to consider inpatient rehabilitation services.   Review of Systems  Unable to perform ROS: medical condition   Past Medical History  Diagnosis Date  . Heart murmur   . Asthma    Past Surgical History  Procedure Laterality Date  . No past surgeries    . Radiology with anesthesia N/A 09/11/2012    Procedure: RADIOLOGY WITH ANESTHESIA-CEREBRAL ANEURYSM COILING;  Surgeon: Medication Radiologist, MD;  Location: [facility] OR;  Service: Radiology;  Laterality: N/A;  . Craniotomy Right 09/11/2012    Procedure: CRANIOTOMY FOR CLIPPING OF  ANEURYSM ;  Surgeon: Lisbeth Renshaw, MD;  Location: [facility] NEURO ORS;  Service: Neurosurgery;  Laterality: Right;  . Craniotomy Right 09/12/2012    Procedure: Right decompressive craniectomy and placement of boneflap in right lower quadrant;  Surgeon: Lisbeth Renshaw, MD;  Location:  [facility] NEURO ORS;  Service: Neurosurgery;  Laterality: Right;   History reviewed. No pertinent family history. Social History:  reports that she has been smoking Cigarettes.  She has been smoking about 0.00 packs per day. She does not have any smokeless tobacco history on file. Her alcohol and drug histories are not on file. Allergies:  Allergies  Allergen Reactions  . Contrast Media [Iodinated Diagnostic Agents] Hives   No prescriptions prior to admission    Home: Home Living Family/patient expects to be discharged to:: Private residence Living Arrangements: Parent Available Help at Discharge: Available 24 hours/day Type of Home: Apartment Home Access: Stairs to enter [employer] of Steps: 2 Home Layout: One level Additional Comments: PTA, lived at home. did not work or go to school. Was on disability. Dad states she like to sleep, and hand out with her friends.   Lives With: Family  Functional History:   Functional Status:  Mobility: Bed Mobility Bed Mobility: Rolling Left;Left Sidelying to Sit;Sitting - Scoot to [place] of Bed Rolling Left: 1: +2 Total assist Rolling Left: Patient Percentage: 10% Left Sidelying to Sit: 1: +2 Total assist;HOB elevated Left Sidelying to Sit: Patient Percentage: 10% Supine to Sit: 1: +2 Total assist;HOB flat Supine to Sit: Patient Percentage: 0% Sitting - Scoot to Edge of Bed: 1: +2 Total assist Sitting - Scoot to Edge of Bed: Patient Percentage: 0% Sit to Supine: 1: +2 Total assist;HOB flat Sit to Supine: Patient Percentage: 0% Transfers Transfers: Not assessed Sit to Stand: 1: +2 Total assist;From bed Sit to Stand: Patient Percentage: 10% Stand to Sit: 1: +2 Total assist;To bed Stand to Sit: Patient Percentage: 10% Stand Pivot Transfers: 1: +2 Total assist (+ 1 for head management and lines management) Stand Pivot Transfers: Patient Percentage: 0% Squat Pivot Transfers: 1: +1 Total  assist Ambulation/Gait Ambulation/Gait  Assistance: Not tested (comment) Stairs: No Wheelchair Mobility Wheelchair Mobility: No  ADL: ADL Eating/Feeding: NPO Grooming:  (pt using washcloth to wipe face on command . Required hand o) Where Assessed - Grooming: Supported sitting Equipment Used: Gait belt Transfers/Ambulation Related to ADLs: +2 total A. Pt weight bearing through RLE during transfer ADL Comments: Focus of session on facilitationg head and trunk control. Pt using RUE to assist with propping. Max assist with head control today. Following commands with decreasedconsistency. R gaze preference. L field cut.   Cognition: Cognition Overall Cognitive Status: Impaired/Different from baseline Arousal/Alertness: Awake/alert Orientation Level: Other (comment) (UTA ) Attention: Focused;Sustained Focused Attention: Impaired (intermittently focused) Focused Attention Impairment: Verbal complex;Functional complex Sustained Attention: Impaired Sustained Attention Impairment: Verbal basic;Functional basic Memory:  (UTA) Awareness: Impaired Problem Solving: Impaired Cognition Arousal/Alertness: Lethargic Behavior During Therapy: Flat affect Overall Cognitive Status: Impaired/Different from baseline Area of Impairment: Following commands;Attention;Problem solving Current Attention Level: Focused Following Commands: Follows one step commands inconsistently Problem Solving: Slow processing;Decreased initiation;Difficulty sequencing;Requires verbal cues;Requires tactile cues General Comments: pt requires significant time to respond  Blood pressure 111/75, pulse 71, temperature 98.8 F (37.1 C), temperature source Axillary, resp. rate 13, height 5\' 1"  (1.549 m), weight 51.5 kg (113 lb 8.6 oz), SpO2 100.00%. Physical Exam  Vitals reviewed. Eyes:  Pupils sluggish to light  Neck:  Tracheostomy tube in place  Cardiovascular: Normal rate and regular rhythm.   Respiratory:  Decreased breath sounds at the bases  GI: Soft.  Bowel sounds are normal. She exhibits no distension.  PEG tube site clean and dry  Neurological:  Patient difficult to arouse. She will not make eye contact with examiner. Left gaze preference. Right facial droop. Doesn't initiate movement with any of her four extremities. Moves eyes to pain stimulation  Skin:  Craniectomy site with dressing in place    Results for orders placed during the hospital encounter of 09/11/12 (from the past 24 hour(s))  URINALYSIS, ROUTINE W REFLEX MICROSCOPIC     Status: Abnormal   Collection Time    10/08/12 11:35 AM      Result Value Range   Color, Urine YELLOW  YELLOW   APPearance TURBID (*) CLEAR   Specific Gravity, Urine 1.020  1.005 - 1.030   pH 8.0  5.0 - 8.0   Glucose, UA NEGATIVE  NEGATIVE mg/dL   Hgb urine dipstick LARGE (*) NEGATIVE   Bilirubin Urine NEGATIVE  NEGATIVE   Ketones, ur NEGATIVE  NEGATIVE mg/dL   Protein, ur NEGATIVE  NEGATIVE mg/dL   Urobilinogen, UA 4.0 (*) 0.0 - 1.0 mg/dL   Nitrite POSITIVE (*) NEGATIVE   Leukocytes, UA LARGE (*) NEGATIVE  BASIC METABOLIC PANEL     Status: Abnormal   Collection Time    10/08/12 11:35 AM      Result Value Range   Sodium 139  135 - 145 mEq/L   Potassium 4.2  3.5 - 5.1 mEq/L   Chloride 98  96 - 112 mEq/L   CO2 37 (*) 19 - 32 mEq/L   Glucose, Bld 113 (*) 70 - 99 mg/dL   BUN 22  6 - 23 mg/dL   Creatinine, Ser 1.61 (*) 0.50 - 1.10 mg/dL   Calcium 9.6  8.4 - 09.6 mg/dL   GFR calc non Af Amer >90  >90 mL/min   GFR calc Af Amer >90  >90 mL/min  URINE MICROSCOPIC-ADD ON     Status: Abnormal   Collection Time    10/08/12  11:35 AM      Result Value Range   WBC, UA 11-20  <3 WBC/hpf   RBC / HPF 3-6  <3 RBC/hpf   Bacteria, UA MANY (*) RARE   Urine-Other AMORPHOUS URATES/PHOSPHATES    CBC     Status: Abnormal   Collection Time    10/09/12  5:15 AM      Result Value Range   WBC 8.8  4.0 - 10.5 K/uL   RBC 3.82 (*) 3.87 - 5.11 MIL/uL   Hemoglobin 11.1 (*) 12.0 - 15.0 g/dL   HCT 96.0 (*) 45.4  - 46.0 %   MCV 84.6  78.0 - 100.0 fL   MCH 29.1  26.0 - 34.0 pg   MCHC 34.4  30.0 - 36.0 g/dL   RDW 09.8 (*) 11.9 - 14.7 %   Platelets 112 (*) 150 - 400 K/uL  BASIC METABOLIC PANEL     Status: Abnormal   Collection Time    10/09/12  5:15 AM      Result Value Range   Sodium 140  135 - 145 mEq/L   Potassium 3.9  3.5 - 5.1 mEq/L   Chloride 97  96 - 112 mEq/L   CO2 38 (*) 19 - 32 mEq/L   Glucose, Bld 116 (*) 70 - 99 mg/dL   BUN 23  6 - 23 mg/dL   Creatinine, Ser 8.29 (*) 0.50 - 1.10 mg/dL   Calcium 56.2  8.4 - 13.0 mg/dL   GFR calc non Af Amer >90  >90 mL/min   GFR calc Af Amer >90  >90 mL/min  PROTIME-INR     Status: None   Collection Time    10/09/12  5:15 AM      Result Value Range   Prothrombin Time 12.9  11.6 - 15.2 seconds   INR 0.99  0.00 - 1.49  APTT     Status: None   Collection Time    10/09/12  5:15 AM      Result Value Range   aPTT 29  24 - 37 seconds  CBC     Status: Abnormal   Collection Time    10/09/12 10:04 AM      Result Value Range   WBC 9.5  4.0 - 10.5 K/uL   RBC 3.94  3.87 - 5.11 MIL/uL   Hemoglobin 11.4 (*) 12.0 - 15.0 g/dL   HCT 86.5 (*) 78.4 - 69.6 %   MCV 84.3  78.0 - 100.0 fL   MCH 28.9  26.0 - 34.0 pg   MCHC 34.3  30.0 - 36.0 g/dL   RDW 29.5 (*) 28.4 - 13.2 %   Platelets 105 (*) 150 - 400 K/uL   No results found.  Assessment/Plan: Diagnosis: Right MCA aneurysm, SAH 1. Does the need for close, 24 hr/day medical supervision in concert with the patient's rehab needs make it unreasonable for this patient to be served in a less intensive setting? Potentially 2. Co-Morbidities requiring supervision/potential complications: htn, respiratory failure with trach 3. Due to bladder management, bowel management, safety, skin/wound care, disease management, medication administration, pain management and patient education, does the patient require 24 hr/day rehab nursing? Potentially 4. Does the patient require coordinated care of a physician, rehab nurse,  PT (1-2 hrs/day, 5 days/week), OT (1-2 hrs/day, 5 days/week) and SLP (1-2 hrs/day, 5 days/week) to address physical and functional deficits in the context of the above medical diagnosis(es)? Yes and Potentially Addressing deficits in the following areas: balance, endurance, locomotion, strength,  transferring, bowel/bladder control, bathing, dressing, feeding, grooming, toileting, cognition, speech, language, swallowing and psychosocial support 5. Can the patient actively participate in an intensive therapy program of at least 3 hrs of therapy per day at least 5 days per week? Potentially 6. The potential for patient to make measurable gains while on inpatient rehab is fair 7. Anticipated functional outcomes upon discharge from inpatient rehab are mod to max assist with PT, mod to max assist with OT, mod to max assist with SLP. 8. Estimated rehab length of stay to reach the above functional goals is: ?4-5 weeks 9. Does the patient have adequate social supports to accommodate these discharge functional goals? Potentially 10. Anticipated D/C setting: Home 11. Anticipated post D/C treatments: HH therapy 12. Overall Rehab/Functional Prognosis: good and fair  RECOMMENDATIONS: This patient's condition is appropriate for continued rehabilitative care in the following setting: LTACH? Patient has agreed to participate in recommended program. n/a Note that insurance prior authorization may be required for reimbursement for recommended care.  Comment: Pt with profound neurological deficits. Will observe for therapy tolerance. May need to look at less intensive venue for therapy.  Rehab RN to follow up.   Ranelle Oyster, MD, [place] [facility]     10/09/2012

## 2012-10-09 NOTE — Op Note (Signed)
Operative Note  Sally Nelson female 27 y.o. 10/09/2012  PREOPERATIVE DX:  Non obstructive hydrocephalus  POSTOPERATIVE DX:  Same  PROCEDURE:  Laparoscopic assisted ventriculoperitoneal shunt placement.         Surgeon: Adolph Pollack   CoSurgeon:  Dr. Conchita Paris  Anesthesia: General endotracheal anesthesia  Indications:   This is a 27 year old female with a complicated course who has drain dependent hydrocephalus.  She presents for the above procedure.   Procedure Detail:  She was brought to the operating room placed supine on the operating table and general anesthetic was minister.  Proper positioning was done.. Hair was clipped in the appropriate areas. The neck, chest, and abdominal wall were sterilely prepped and draped.  A small incision was made in the right subcostal area. Using a 5 mm Opti-Vu trocar and laparoscope access was gained into the peritoneal cavity. The area underneath the trocar was inspected and there was no evidence of organ injury or bleeding. Under direct vision a 5 mm trocar was placed in the subumbilical area . An area was chosen in the epigastrium.  Dr. Conchita Paris passed the tunneler  from the neck area down to the epigastrium. A small incision was made over the tip of the tunneler.  The shunt catheter was then passed from the neck through the tunneler and pulled out onto the abdominal wall. Fluid was dripping from the tip of the catheter.  The 5 mm trocar introducer was then placed through the epigastric incision into the abdominal cavity under direct vision and the track was dilated using this. A dilator introducer sheath device was then placed into the peritoneal cavity. The dilator was removed. The shunt catheter was threaded through the peel-away introducer and it was positioned in the pelvis. The peel-away introducer was then removed.  A 4 quadrant inspection was then performed and there was no evidence of organ injury or bleeding. The trocars were  removed the pneumoperitoneum was released.  The skin incisions were closed with 4-0 Monocryl subcuticular stitches followed by Steri-Strips and sterile dressings. She tolerated the procedure without apparent complications and was taken to the ICU in stable condition.         Complications:  * No complications entered in OR log *           Condition: stable

## 2012-10-09 NOTE — Progress Notes (Signed)
PULMONARY  / CRITICAL CARE MEDICINE  Name: Sally Nelson MRN: [id number] DOB: 07-09-1985    ADMISSION DATE:  09/11/2012 CONSULTATION DATE:  09/11/2012  REFERRING MD :  Neurosurgery PRIMARY SERVICE: PCCM  CHIEF COMPLAINT:  Post op vent management  BRIEF PATIENT DESCRIPTION: 27 y/o with SAH s/p craniotomy and clipping of R MCA aneurysm for [facility] 9/12. PCCM continuing to follow for post op vent management.  SIGNIFICANT EVENTS / STUDIES:  9/12 OR: Craniotomy, clipping R MCA aneurysm for a [facility] 9/12 Head CT: Interval development R SDH, effacement basilar cisterns, evolving R MCA CVA, midline shift 14mm 9/23 PEG Tube, tracheostomy placement 10/1 CT head: increasing hydrocephalus, persistent herniation of right frontal and temporal lobes, mild intraventricular hemorrhage 10/6 scheduled for VP shunt placement Thursday. Continues to have ventriculostomy drainage. Trach in place 10/8 VPS shunt scheduled for tomorrow 10/9 VPS shunt scheduled. Pt has new UTI- pending urine culture  LINES / TUBES: ETT 9/12 >>9/23  L [place]  9/12 >>> Ventriculostomy 9/17 >>> PEG 9/23 >>> Tracheostomy (jy) 9/23 >>> Foley 9/24>>>  CULTURES: 9/11 MRSA>>> neg 9/12 Respirstory >>> gram neg cocci, non-pathogenic flora 9/12 Blood >>> neg 9/17 Urine >>> neg 9/18 Blood >>> neg 9/22Blood >>>Neg 9/22 Urine >>> E. Coli sensitive to Zosyn 9/22 CSF >>> neg 9/22 Respiratory >>>neg 10/8 Urine>>>  ANTIBIOTICS: Unasyn 9/12 >>> 9/20 Vancomycin 9/22 >>> 9/29 Zosyn 9/22 >>> 9/29  SUBJECTIVE:  No acute issues overnight. Pt has new UTI. VPS placement today   VITAL SIGNS: Temp:  [98.1 F (36.7 C)-98.8 F (37.1 C)] 98.8 F (37.1 C) (10/09 0400) Pulse Rate:  [63-112] 63 (10/09 0600) Resp:  [0-32] 20 (10/09 0300) BP: (96-123)/(59-80) 111/75 mmHg (10/09 0600) SpO2:  [99 %-100 %] 100 % (10/09 0600) FiO2 (%):  [28 %] 28 % (10/09 0500) Weight:  [113 lb 8.6 oz (51.5 kg)] 113 lb 8.6 oz (51.5 kg) (10/09 0500)  VENTILATOR  SETTINGS: Vent Mode:  [-]  FiO2 (%):  [28 %] 28 %  INTAKE / OUTPUT: Intake/Output     10/08 0701 - 10/09 0700 10/09 0701 - 10/10 0700   I.V. (mL/kg) 460 (8.9)    NG/GT 1380    IV Piggyback     Total Intake(mL/kg) 1840 (35.7)    Urine (mL/kg/hr) 1740 (1.4)    Drains 53.5 (0)    Total Output 1793.5     Net +46.5           PHYSICAL EXAMINATION: Gen: chronically ill appearing female, no distress, alert Neuro: RASS -1, non- verbal, left hemiparesis, able to give thumbs up and wiggle toes on right side HEENT: Ventriculostomy in place, tracheostomy in place, PERRL but sluggish PULM: clear to auscultation bilaterally CV: Regular, Systolic/diastolic murmur  AB: Soft abdomen, + BS, PEG in place, cloudy urine output Ext: No edema, skin warm and dry, foot drop boots in place  Recent Labs Lab 10/03/12 0500 10/04/12 0429 10/07/12 0600 10/07/12 2345 10/08/12 1135 10/09/12 0515  HGB 9.8* 9.7* 10.3*  --   --  11.1*  WBC 9.1 8.8 9.5  --   --  8.8  PLT 163 146* 121*  --   --  112*  NA 137 142 143 142 139 140  K 3.8 4.1 3.1* 4.1 4.2 3.9  CL 100 104 98 99 98 97  CO2 31 30 38* 37* 37* 38*  GLUCOSE 116* 125* 103* 120* 113* 116*  BUN 22 20 19 19 22 23   CREATININE 0.33* 0.33* 0.36* 0.34* 0.37* 0.35*  CALCIUM  9.0 8.9 9.3 9.6 9.6 10.0  MG 2.1 1.9 1.9  --   --   --   PHOS 3.9 3.7 4.2  --   --   --   INR  --   --  1.02  --   --  0.99  APTT  --   --  42*  --   --  29    Recent Labs Lab 10/05/12 1942 10/05/12 2356 10/06/12 0356 10/06/12 0803 10/06/12 1127  GLUCAP 95 88 83 92 112*   CXR 10/7: improved right basilar atelectasis/edema  ASSESSMENT / PLAN:  PULMONARY A:  Post op respiratory failure- S/p tracheostomy Asthma-stable P:   -Trach collar as tolerated, will attempt to change to cuffless 24 hrs post op shunt -has balloon to inflate during OR case today -consider 8 cc/kg intra op -albuterol PRN  CARDIOVASCULAR A: 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),  required surgery as an infant Chronic diastolic heart failure HTN P:  - Metoprolol PRN - continue Zocor - hold lasix till after procedure   RENAL A:   Suspected cerebral salt waisting-resolved Hypokalemia- resolved P:   -lytes planned in am  -kvo maintain  GASTROINTESTINAL/ GU A:   Dysphagia- s/p PEG placement UTI- resolved P:   - hold TF for VPS placement  -PPI for SUP  -Oxybutynin   HEMATOLOGIC A:   Anemia Hemoconcentration P:  -hold [place] Heparin for shunt -scd in place -coags reivewed  INFECTIOUS A:  Suspected aspiration pneumonia   UTI- new, cx pending  afebrile P:   -UA positive, culture pending, if pos, start ABX and change / dc foley if post op  ENDOCRINE A:   Steroid induced hyperglycemia, resolved.   Suspected adrenal insufficiency P:   -Glycemic Control Protocol, Phase 1  NEUROLOGIC A:   SAH- s/p R MCA clipping c/b R MCA territory CVA, edema and effacement Hydrocephalus P:   -VP shunt planned at 230 pm - continue Keppra  - continue Fentanyl PRN  - EVD clamped since evening of 10/8 - daily neuro checks -continue PT/OT  Nathanial Rancher PA- [facility]   I have personally obtained history, examined patient, evaluated and interpreted laboratory and imaging results, reviewed medical records, formulated assessment / plan and placed orders.   10/09/2012, 7:17 AM  Mcarthur Rossetti. Tyson Alias, MD, FACP Pgr: [phone] [facility]

## 2012-10-09 NOTE — Op Note (Signed)
PREOP DIAGNOSIS: Non-obstructive hydrocephalus  POSTOP DIAGNOSIS: Same  PROCEDURE: 1. Laparoscopic Assisted ventriculoperitoneal shunt placement  SURGEON: Dr. Lisbeth Renshaw, MD  CO-SURGEON: Dr. Avel Peace  ANESTHESIA: General Endotracheal  EBL: 50cc  SPECIMENS: None  DRAINS: None  COMPLICATIONS: None immediate  CONDITION: Stable to ICU  SHUNT PLACED: Medtronic Strata II, set to 1.0  HISTORY: Sally Nelson is a 27 y.o. female Was admitted to the neuro intensive care unit with subarachnoid hemorrhage.  She previously underwent right craniotomy for clipping of a middle cerebral artery aneurysm.  Postoperatively, she was unable to be weaned from the external ventricular drain and developed shunt-dependent communicating hydrocephalus.  Ventriculoperitoneal shunt was therefore indicated.  The risks and benefits of the procedure were explained in detail to the patient's father.  After all his questions were answered, written and verbal consent was obtained.  PROCEDURE IN DETAIL: The patient was brought to the operating room and transferred to the operative table. After induction of general anesthesia, the patient was positioned on the operative table in the supine position with all pressure points meticulously padded. The skin of the scalp,  neck, chest, and abdomen were prepped in the usual sterile fashion.  The previously made left frontal scalp incision was opened sharply, and the external ventricular drain was identified.  A tunnel was then created using a hemostat subcutaneously to the retroauricular region.  Counter incision was made behind the ear, and the distal shunt apparatus was then passed from a distal to proximal fashion, and the valve was seated subcutaneously.  The shunt passer was then used to tunnel from the retroauricular incision into the right upper quadrant.   The previously placed external ventricular drain was then clamped and removed, and using standard  anatomic landmarks a 5 cm ventricular catheter was then placed in the previously placed bur hole.  Good clear CSF flow was obtained. The catheter was then connected to the valve apparatus.  The valve was then pumped, and good distal flow was observed.  The distal portion of the shunt was then placed in the intraperitoneal space under laparoscopic guidance by Dr. Abbey Chatters.  The details of this procedure are dictated in a separate report.  At this point the scalp wounds were irrigated with copious amounts of bacitracin irrigation, and closed using 3-0 Vicryl stitches.  The skin was then closed using standard surgical skin staples.  Sterile dressings were then applied.  At the end of the case all sponge needle and instrument counts were correct.  The distal portion of the external ventricular drain was then removed and the tunnelled exit site was closed using skin staples.  The patient tolerated the procedure well and was extubated in the room and taken to the postanesthesia care unit in stable condition.

## 2012-10-09 NOTE — Preoperative (Signed)
Beta Blockers   Reason not to administer Beta Blockers:Not Applicable 

## 2012-10-09 NOTE — Progress Notes (Signed)
SLP Cancellation Note  Patient Details Name: Yamilka Lopiccolo MRN: [id number] DOB: 1985/07/21   Cancelled treatment:        Per RN, pt more alert earlier this morning, worked with OT.  Pt is currently quite lethargic, and unable to participate in therapy.  Pt is scheduled for shunt placement this afternoon.  ST to continue to follow patient for PMSV treatments.  Celia B. Courtland, [facility], CCC-SLP [phone]  Leigh Aurora 10/09/2012, 11:31 AM

## 2012-10-09 NOTE — Progress Notes (Signed)
NUTRITION FOLLOW UP  Intervention:   Continue current TF regimen  1. Osmolite 1.2 @ 60 ml/hr  2. 30 ml Prostat daily  Tube feeding regimen provides 1828 kcal, 95 grams of protein, and 1167 ml of H2O.   Nutrition Dx:   Inadequate oral intake related to inability to eat as evidenced by NPO status; ongoing.    Goal:  Intake to meet >90% of estimated nutrition needs, met.   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 Plans for a VP shunt.  Trach and PEG 9/23. Pt now on trach collar.  Pt started on scheduled imodium 9/29.  Pt's tube feeding changed to Osmolite 1.2 on 10/2 due to persistent diarrhea. Diarrhea now resolved. Flexiseal removed 10/5.    Pt discussed during ICU rounds and with RN.  Imodium being held since pt has not had a BM since 10/6.  Pt had VP shunt placed 10/9.   Height: Ht Readings from Last 1 Encounters:  09/11/12 5\' 1"  (1.549 m)    Weight Status:   Wt Readings from Last 1 Encounters:  10/09/12 113 lb 8.6 oz (51.5 kg)  Admission weight: 118 lb 9/11  Re-estimated needs:  Kcal: 1700-1900 Protein: 80-100 grams  Fluid: >1.5 L/day  Skin: head and abd incision; stage II pressure ulcer dated 9/27  Diet Order: NPO NPO    Intake/Output Summary (Last 24 hours) at 10/09/12 1202 Last data filed at 10/09/12 1100  Gross per 24 hour  Intake   1740 ml  Output 1801.5 ml  Net  -61.5 ml    Last BM: 10/6  Labs:   Recent Labs Lab 10/03/12 0500 10/04/12 0429 10/07/12 0600  10/08/12 1135 10/09/12 0515 10/09/12 1004  NA 137 142 143  < > 139 140 139  K 3.8 4.1 3.1*  < > 4.2 3.9 4.2  CL 100 104 98  < > 98 97 97  CO2 31 30 38*  < > 37* 38* 35*  BUN 22 20 19   < > 22 23 22   CREATININE 0.33* 0.33* 0.36*  < > 0.37* 0.35* 0.38*  CALCIUM 9.0 8.9 9.3  < > 9.6 10.0 10.1  MG 2.1 1.9 1.9  --   --   --   --   PHOS 3.9 3.7 4.2  --   --   --   --   GLUCOSE  116* 125* 103*  < > 113* 116* 99  < > = values in this interval not displayed.  CBG (last 3)  No results found for this basename: GLUCAP,  in the last 72 hours  Scheduled Meds: . antiseptic oral rinse  15 mL Mouth Rinse QID  . chlorhexidine  15 mL Mouth Rinse BID  . feeding supplement (PRO-STAT SUGAR FREE 64)  30 mL Per Tube Daily  . levETIRAcetam  500 mg Per Tube Q12H  . loperamide  4 mg Per Tube BID  . oxybutynin  2.5 mg Per Tube BID  . pantoprazole sodium  40 mg Per Tube Q1200  . simvastatin  80 mg Oral QHS    Continuous Infusions: . sodium chloride 20 mL/hr at 10/09/12 0956  . feeding supplement (OSMOLITE 1.2 CAL) Stopped (10/09/12 0600)    Kendell Bane RD, LDN, CNSC [phone] Pager [phone] After Hours Pager

## 2012-10-09 NOTE — Anesthesia Preprocedure Evaluation (Addendum)
Anesthesia Evaluation  Patient identified by MRN, date of birth, ID band Patient awake  General Assessment Comment:Does not follow commands or make eye contact   History of Anesthesia Complications (+) history of anesthetic complications  Airway       Dental   Pulmonary asthma , Current Smoker,  Trach s/p SAH and subsequent cerebral aneurysm clipping breath sounds clear to auscultation  Pulmonary exam normal       Cardiovascular hypertension, Pt. on medications + Valvular Problems/Murmurs MR Rhythm:Regular Rate:Normal  9/14 ECHO: normal LV systolic function with grade 2 diastolic dysfunction, EF 60-65%, mod-severe MR, mild-mod pulm stenosis   Neuro/Psych S/p SAH and cerebral aneurysm clipping    GI/Hepatic Neg liver ROS, GERD-  Controlled,PEG: tube feeds held from 6am   Endo/Other  negative endocrine ROS  Renal/GU negative Renal ROS     Musculoskeletal   Abdominal   Peds  Hematology   Anesthesia Other Findings trach  Reproductive/Obstetrics                        Anesthesia Physical Anesthesia Plan  ASA: IV  Anesthesia Plan: General   Post-op Pain Management:    Induction: Intravenous  Airway Management Planned: Tracheostomy  Additional Equipment:   Intra-op Plan:   Post-operative Plan:   Informed Consent: I have reviewed the patients History and Physical, chart, labs and discussed the procedure including the risks, benefits and alternatives for the proposed anesthesia with the patient or authorized representative who has indicated his/her understanding and acceptance.   History available from chart only  Plan Discussed with: Surgeon and CRNA  Anesthesia Plan Comments: (Plan routine monitors, GETA via trach)        Anesthesia Quick Evaluation

## 2012-10-10 ENCOUNTER — Inpatient Hospital Stay (HOSPITAL_COMMUNITY): Payer: Medicaid Other

## 2012-10-10 LAB — URINE CULTURE

## 2012-10-10 LAB — BASIC METABOLIC PANEL
CO2: 30 mEq/L (ref 19–32)
Calcium: 9.5 mg/dL (ref 8.4–10.5)
GFR calc Af Amer: >90 mL/min (ref >90)
GFR calc non Af Amer: >90 mL/min (ref >90)
Glucose, Bld: 91 mg/dL (ref 70–99)
Sodium: 139 mEq/L (ref 135–145)

## 2012-10-10 LAB — CBC
HCT: 32.8 % — ABNORMAL LOW (ref 36.0–46.0)
MCH: 28.9 pg (ref 26.0–34.0)
MCHC: 34.1 g/dL (ref 30.0–36.0)
Platelets: 100 10*3/uL — ABNORMAL LOW (ref 150–400)
RBC: 3.88 MIL/uL (ref 3.87–5.11)
RDW: 18.8 % — ABNORMAL HIGH (ref 11.5–15.5)
WBC: 11.3 10*3/uL — ABNORMAL HIGH (ref 4.0–10.5)

## 2012-10-10 MED ORDER — PRO-STAT SUGAR FREE PO LIQD
30.0000 mL | Freq: Every day | ORAL | Status: DC
  Administered 2012-10-10 – 2012-10-26 (×16): 30 mL
  Filled 2012-10-10 (×18): qty 30

## 2012-10-10 MED ORDER — OSMOLITE 1.2 CAL PO LIQD
1000.0000 mL | ORAL | Status: DC
  Administered 2012-10-10 – 2012-10-13 (×5): 1000 mL
  Filled 2012-10-10 (×8): qty 1000

## 2012-10-10 MED ORDER — CIPROFLOXACIN IN D5W 400 MG/200ML IV SOLN
400.0000 mg | Freq: Two times a day (BID) | INTRAVENOUS | Status: DC
  Administered 2012-10-10 – 2012-10-11 (×4): 400 mg via INTRAVENOUS
  Filled 2012-10-10 (×6): qty 200

## 2012-10-10 NOTE — Progress Notes (Signed)
Pt seen and examined. No issues overnight.   EXAM: Temp:  [96.2 F (35.7 C)-98.7 F (37.1 C)] 98.7 F (37.1 C) (10/10 1200) Pulse Rate:  [64-117] 82 (10/10 1330) Resp:  [0-35] 16 (10/10 1330) BP: (115-143)/(74-93) 123/86 mmHg (10/10 1330) SpO2:  [98 %-100 %] 100 % (10/10 1330) FiO2 (%):  [28 %] 28 % (10/10 0759) Weight:  [50.1 kg (110 lb 7.2 oz)] 50.1 kg (110 lb 7.2 oz) (10/10 0500) Intake/Output     10/09 0701 - 10/10 0700 10/10 0701 - 10/11 0700   I.V. (mL/kg) 2945 (58.8) 248.3 (5)   Other 30 60   NG/GT 0 286   IV Piggyback  200   Total Intake(mL/kg) 2975 (59.4) 794.3 (15.9)   Urine (mL/kg/hr) 2665 (2.2) 150 (0.4)   Drains     Blood 50 (0)    Total Output 2715 150   Net +260 +644.3         Arousable Trached, on trach collar Follows commands RUE, RLE Minimal movements of LLE, LUE Wound dressed, c/d/i  LABS: Lab Results  Component Value Date   CREATININE 0.49* 10/10/2012   BUN 14 10/10/2012   NA 139 10/10/2012   K 4.3 10/10/2012   CL 99 10/10/2012   CO2 30 10/10/2012   Lab Results  Component Value Date   WBC 11.3* 10/10/2012   HGB 11.2* 10/10/2012   HCT 32.8* 10/10/2012   MCV 84.5 10/10/2012   PLT 100* 10/10/2012    IMAGING: CTH reviewed demonstrating good shunt catheter position, slight decrease in ventriculomegaly in comparison to prior scan.  IMPRESSION: - 27 y.o. female SAH d# 54 POD #1 s/p lap assisted VPS - Neurologically at baseline  PLAN: - Stable for transfer to Stepdown unit - Can remove scalp dressings tomorrow - D/C Planning

## 2012-10-10 NOTE — Progress Notes (Signed)
Physical Therapy Treatment Patient Details Name: Sally Nelson MRN: [id number] DOB: September 24, 1985 Today's Date: 10/10/2012 Time: 4098-1191 PT Time Calculation (min): 34 min  PT Assessment / Plan / Recommendation  History of Present Illness Pt with RMCA aneurysm clipping and crani 9/11.  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     PT Comments   Pt continues to be lethargic and unable to follow commands at this time.  Pt seems more fatigue this session due to recently returning from CT scan and shunt placement yesterday 10/09/12.  Will continue to assess best d/c plans.    Follow Up Recommendations  CIR;Supervision/Assistance - 24 hour     Equipment Recommendations  Other (comment) (TBA)    Recommendations for Other Services Rehab consult  Frequency Min 3X/week   Progress towards PT Goals Progress towards PT goals: Not progressing toward goals - comment (Pt fatigue from previous procedure)  Plan Current plan remains appropriate    Precautions / Restrictions Precautions Precautions: Fall   Pertinent Vitals/Pain Unable to rate pain     Mobility  Bed Mobility Bed Mobility: Rolling Left;Left Sidelying to Sit Rolling Left: 1: +2 Total assist Rolling Left: Patient Percentage: 10% Left Sidelying to Sit: 1: +2 Total assist;HOB elevated Left Sidelying to Sit: Patient Percentage: 10% Details for Bed Mobility Assistance: pt requires to con't assist for trunk elevation and LE mangement.pt remains unable to maintain head control as well Transfers Transfers: Heritage manager Transfers: 1: +2 Total assist Squat Pivot Transfers: Patient Percentage: 0% Details for Transfer Assistance: Pt unable to assist with transfer and needs assistance to keep head control  Ambulation/Gait Ambulation/Gait Assistance: Not tested (comment) Modified Rankin (Stroke Patients Only) Pre-Morbid Rankin Score: No symptoms Modified  Rankin: Severe disability    Exercises General Exercises - Upper Extremity Elbow Flexion: AAROM;Right;5 reps;Seated Other Exercises Other Exercises: RUE A/AAROM Other Exercises: LUE PROM   PT Diagnosis:    PT Problem List:   PT Treatment Interventions:     PT Goals (current goals can now be found in the care plan section) Acute Rehab PT Goals Patient Stated Goal: unable to state PT Goal Formulation: With patient Time For Goal Achievement: 10/14/12 Potential to Achieve Goals: Good  Visit Information  Last PT Received On: 10/10/12 Assistance Needed: +2 History of Present Illness: Pt with RMCA aneurysm clipping and crani 9/11.  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      Subjective Data  Subjective: Pt more fatigue and only completed thumbs 1 out of 8 trials.  Patient Stated Goal: unable to state   Cognition  Cognition Arousal/Alertness: Lethargic;Awake/alert Behavior During Therapy: Flat affect Overall Cognitive Status: Impaired/Different from baseline Area of Impairment: Following commands;Attention;Problem solving Current Attention Level: Focused Following Commands:  (Pt unable to follow commands at this session. )    Balance  Balance Balance Assessed: Yes Static Sitting Balance Static Sitting - Balance Support: Feet supported Static Sitting - Level of Assistance: 1: +1 Total assist  End of Session PT - End of Session Equipment Utilized During Treatment: Oxygen (trach) Activity Tolerance: Patient limited by lethargy Patient left: in chair;with call bell/phone within reach Nurse Communication: Mobility status;Need for lift equipment   GP     Emilo Gras 10/10/2012, 12:56 PM  [place], PT DPT [phone]

## 2012-10-10 NOTE — Progress Notes (Signed)
SLP Cancellation Note  Patient Details Name: Sally Nelson MRN: [id number] DOB: [birth year]   Cancelled treatment:       Reason Eval/Treat Not Completed: Patient at procedure or test/unavailable. Pt having trach downsized to 4 cuffless, then moved to new room. SLP to continue efforts Monday.    Benigno Check, Riley Nearing 10/10/2012, 4:03 PM

## 2012-10-10 NOTE — Procedures (Signed)
Trach change   Removed trach 6 No bleeding Placed 4 cuffless  Cap pos, equal BS  Mcarthur Rossetti. Tyson Alias, MD, FACP Pgr: [phone] [facility]

## 2012-10-10 NOTE — Progress Notes (Signed)
1 Day Post-Op  Subjective: Eyes are open. On trach collar.  Objective: Vital signs in last 24 hours: Temp:  [96.2 F (35.7 C)-98.4 F (36.9 C)] 98.4 F (36.9 C) (10/10 0350) Pulse Rate:  [59-83] 67 (10/10 0500) Resp:  [0-35] 13 (10/10 0500) BP: (110-143)/(70-92) 131/83 mmHg (10/10 0500) SpO2:  [99 %-100 %] 100 % (10/10 0500) FiO2 (%):  [28 %] 28 % (10/10 0400) Weight:  [110 lb 7.2 oz (50.1 kg)] 110 lb 7.2 oz (50.1 kg) (10/10 0500) Last BM Date: 10/06/12  Intake/Output from previous day: 10/09 0701 - 10/10 0700 In: 2900 [I.V.:2870] Out: 2340 [Urine:2290; Blood:50] Intake/Output this shift:    PE: General- In NAD Abdomen-soft, some dried drainage on subumbilical dressings, other dressings are dry  Lab Results:   Recent Labs  10/09/12 1004 10/10/12 0550  WBC 9.5 11.3*  HGB 11.4* 11.2*  HCT 33.2* 32.8*  PLT 105* 100*   BMET  Recent Labs  10/09/12 1004 10/10/12 0550  NA 139 139  K 4.2 4.3  CL 97 99  CO2 35* 30  GLUCOSE 99 91  BUN 22 14  CREATININE 0.38* 0.49*  CALCIUM 10.1 9.5   PT/INR  Recent Labs  10/09/12 0515  LABPROT 12.9  INR 0.99   Comprehensive Metabolic Panel:    Component Value Date/Time   NA 139 10/10/2012 0550   K 4.3 10/10/2012 0550   CL 99 10/10/2012 0550   CO2 30 10/10/2012 0550   BUN 14 10/10/2012 0550   CREATININE 0.49* 10/10/2012 0550   GLUCOSE 91 10/10/2012 0550   CALCIUM 9.5 10/10/2012 0550   AST 31 10/09/2012 1004   ALT 43* 10/09/2012 1004   ALKPHOS 94 10/09/2012 1004   BILITOT 0.5 10/09/2012 1004   PROT 7.7 10/09/2012 1004   ALBUMIN 3.2* 10/09/2012 1004     Studies/Results: Ct Head Wo Contrast  10/10/2012   *RADIOLOGY REPORT*  Clinical Data: Hydrocephalus, status post ventriculoperitoneal shunt.  History of ruptured aneurysm.  CT HEAD WITHOUT CONTRAST  Technique:  Contiguous axial images were obtained from the base of the skull through the vertex without contrast.  Comparison: CT of head October 01, 2012  Findings:  Ventriculoperitoneal shunt in place via high left frontal burr hole, with extensive scalp subcutaneous gas suggesting recent exchange.  Catheter enters via high left frontal burr hole, coursing to the left frontal lobe to terminate near the left foramen of Monro. New minimal intraventricular air.  The bifrontal horn measures 4.9 cm, previously 5 cm.  Minimal layering blood products within the occipital horns.  5 mm of left to right subfalcine herniation persists. Decreased low density along the left frontal catheter tract persistent may reflect residual cerebral spinal fluid due to back pressure hydrocephalus.  Status post right frontotemporal parietal decompressive craniectomy, with slightly decreased external herniation of the right cerebrum through the defect.  Low density presumed vasogenic edema and scoliosis is similar.  Aneurysm clip in the region of the right middle cerebral artery bifurcation. Right frontotemporal hypodensity consistent with remote middle cerebral artery territory infarct, with the mild Wallerian degeneration.  No new intraparenchymal hemorrhage, basal cisterns are relatively patent.  IMPRESSION: Status post left ventriculoperitoneal shunt exchange, the catheter tip now terminates near the foramen Monro. Slightly decreased moderate hydrocephalus, though there is decreased low density along the catheter tract suggesting improved, decreased back pressure. Degenerating intraventricular blood products.  Status post right decompressive craniectomy with slightly decreased external herniation of cerebrum through the defect, status post apparent aneurysm clipping of right  middle cerebral artery bifurcation.  Remote middle cerebral artery territory infarct.   Original Report Authenticated By: Awilda Metro    Anti-infectives: Anti-infectives   Start     Dose/Rate Route Frequency Ordered Stop   10/09/12 1643  bacitracin 50,000 Units in sodium chloride irrigation 0.9 % 500 mL irrigation   Status:  Discontinued       As needed 10/09/12 1643 10/09/12 1756   10/09/12 1623  ceFAZolin (ANCEF) 1-5 GM-% IVPB    Comments:  Otho Perl   : cabinet override      10/09/12 1623 10/10/12 0429   09/29/12 1115  fluconazole (DIFLUCAN) 40 MG/ML suspension 200 mg     200 mg Per Tube Daily 09/29/12 1005 10/01/12 1100   09/27/12 0000  vancomycin (VANCOCIN) 1,250 mg in sodium chloride 0.9 % 250 mL IVPB  Status:  Discontinued     1,250 mg 166.7 mL/hr over 90 Minutes Intravenous Every 8 hours 09/26/12 2050 09/30/12 0949   09/25/12 1800  vancomycin (VANCOCIN) IVPB 1000 mg/200 mL premix  Status:  Discontinued     1,000 mg 200 mL/hr over 60 Minutes Intravenous Every 8 hours 09/25/12 1319 09/26/12 2050   09/23/12 0200  vancomycin (VANCOCIN) 500 mg in sodium chloride 0.9 % 100 mL IVPB  Status:  Discontinued     500 mg 100 mL/hr over 60 Minutes Intravenous Every 8 hours 09/22/12 1743 09/25/12 1319   09/22/12 1800  piperacillin-tazobactam (ZOSYN) IVPB 3.375 g  Status:  Discontinued     3.375 g 12.5 mL/hr over 240 Minutes Intravenous Every 8 hours 09/22/12 1744 09/30/12 0949   09/22/12 1745  vancomycin (VANCOCIN) IVPB 750 mg/150 ml premix     750 mg 150 mL/hr over 60 Minutes Intravenous  Once 09/22/12 1743 09/22/12 1907   09/12/12 1053  bacitracin 50,000 Units in sodium chloride irrigation 0.9 % 500 mL irrigation  Status:  Discontinued       As needed 09/12/12 1054 09/12/12 1212   09/12/12 0800  Ampicillin-Sulbactam (UNASYN) 3 g in sodium chloride 0.9 % 100 mL IVPB  Status:  Discontinued     3 g 100 mL/hr over 60 Minutes Intravenous Every 8 hours 09/12/12 0152 09/12/12 0746   09/12/12 0800  Ampicillin-Sulbactam (UNASYN) 3 g in sodium chloride 0.9 % 100 mL IVPB  Status:  Discontinued     3 g 100 mL/hr over 60 Minutes Intravenous 4 times per day 09/12/12 0746 09/21/12 1155   09/12/12 0200  Ampicillin-Sulbactam (UNASYN) 3 g in sodium chloride 0.9 % 100 mL IVPB     3 g 100 mL/hr over 60 Minutes  Intravenous  Once 09/12/12 0152 09/12/12 0336   09/11/12 2308  ceFAZolin (ANCEF) 1-5 GM-% IVPB    Comments:  Aydelette, Jamie   : cabinet override      09/11/12 2308 09/12/12 1114   09/11/12 1945  bacitracin 50,000 Units in sodium chloride irrigation 0.9 % 500 mL irrigation  Status:  Discontinued       As needed 09/11/12 2050 09/12/12 0030   09/11/12 1923  ceFAZolin (ANCEF) 2-3 GM-% IVPB SOLR    Comments:  Toney Sang   : cabinet override      09/11/12 1923 09/11/12 2310      Assessment Principal Problem:   SAH (subarachnoid hemorrhage)   HCP s/p lap assisted vp shunt-stable from this    LOS: 29 days   Plan: Restart tube feeds.  Remove abdominal bandages tomorrow.  Please call if we can be of further assistance.  Dixie Jafri J 10/10/2012

## 2012-10-10 NOTE — Progress Notes (Signed)
PULMONARY  / CRITICAL CARE MEDICINE  Name: Sally Nelson MRN: [id number] DOB: 12-Dec-1985    ADMISSION DATE:  09/11/2012 CONSULTATION DATE:  09/11/2012  REFERRING MD :  Neurosurgery PRIMARY SERVICE: PCCM  CHIEF COMPLAINT:  Post op vent management  BRIEF PATIENT DESCRIPTION: 27 y/o with SAH s/p craniotomy and clipping of R MCA aneurysm for [facility] 9/12. PCCM continuing to follow for post op vent management.  SIGNIFICANT EVENTS / STUDIES:  9/12 OR: Craniotomy, clipping R MCA aneurysm for a [facility] 9/12 Head CT: Interval development R SDH, effacement basilar cisterns, evolving R MCA CVA, midline shift 14mm 9/23 PEG Tube, tracheostomy placement 10/1 CT head: increasing hydrocephalus, persistent herniation of right frontal and temporal lobes, mild intraventricular hemorrhage 10/6 scheduled for VP shunt placement Thursday. Continues to have ventriculostomy drainage. Trach in place 10/8 VPS shunt scheduled for tomorrow 10/9 VPS shunt scheduled. Pt has new UTI- pending urine culture 10/10 post lap assisted VPS placement.   LINES / TUBES: ETT 9/12 >>9/23  L Wiseman  9/12 >>>10/9 Ventriculostomy 9/17 >>>10/9 PEG 9/23 >>> Tracheostomy (jy) 9/23 >>> Foley 9/24>>> 10/10  VPS 10/9>>>  CULTURES: 9/11 MRSA>>> neg 9/12 Respirstory >>> gram neg cocci, non-pathogenic flora 9/12 Blood >>> neg 9/17 Urine >>> neg 9/18 Blood >>> neg 9/22Blood >>>Neg 9/22 Urine >>> E. Coli sensitive to Zosyn 9/22 CSF >>> neg 9/22 Respiratory >>>neg 10/8 Urine>>> E. Coli   ANTIBIOTICS: Unasyn 9/12 >>> 9/20 Vancomycin 9/22 >>> 9/29 Zosyn 9/22 >>> 9/29 Cipro 10/10>>>  SUBJECTIVE:  No acute issues overnight. VPS placed. Foley to be removed today    VITAL SIGNS: Temp:  [96.2 F (35.7 C)-98.4 F (36.9 C)] 98.4 F (36.9 C) (10/10 0350) Pulse Rate:  [59-83] 67 (10/10 0500) Resp:  [0-35] 13 (10/10 0500) BP: (110-143)/(70-92) 131/83 mmHg (10/10 0500) SpO2:  [99 %-100 %] 100 % (10/10 0500) FiO2 (%):  [28 %] 28 %  (10/10 0400) Weight:  [110 lb 7.2 oz (50.1 kg)] 110 lb 7.2 oz (50.1 kg) (10/10 0500)  VENTILATOR SETTINGS: Vent Mode:  [-]  FiO2 (%):  [28 %] 28 %  INTAKE / OUTPUT: Intake/Output     10/09 0701 - 10/10 0700 10/10 0701 - 10/11 0700   I.V. (mL/kg) 2870 (57.3)    Other 30    NG/GT 0    Total Intake(mL/kg) 2900 (57.9)    Urine (mL/kg/hr) 2290 (1.9)    Drains     Blood 50 (0)    Total Output 2340     Net +560           PHYSICAL EXAMINATION: Gen: chronically ill appearing female, no distress Neuro: RASS -2, non- verbal, left hemiparesis HEENT: tracheostomy in place, PERRL but sluggish PULM: clear to auscultation bilaterally CV: Regular, Systolic/diastolic murmur  AB: Soft abdomen, + BS, PEG in place Ext: No edema, skin warm and dry, sacral bed sore, foot drop boots in place  Recent Labs Lab 10/04/12 0429 10/07/12 0600 10/07/12 2345 10/08/12 1135 10/09/12 0515 10/09/12 1004 10/10/12 0550  HGB 9.7* 10.3*  --   --  11.1* 11.4* 11.2*  WBC 8.8 9.5  --   --  8.8 9.5 11.3*  PLT 146* 121*  --   --  112* 105* 100*  NA 142 143 142 139 140 139 139  K 4.1 3.1* 4.1 4.2 3.9 4.2 4.3  CL 104 98 99 98 97 97 99  CO2 30 38* 37* 37* 38* 35* 30  GLUCOSE 125* 103* 120* 113* 116* 99 91  BUN  20 19 19 22 23 22 14   CREATININE 0.33* 0.36* 0.34* 0.37* 0.35* 0.38* 0.49*  CALCIUM 8.9 9.3 9.6 9.6 10.0 10.1 9.5  MG 1.9 1.9  --   --   --   --   --   PHOS 3.7 4.2  --   --   --   --   --   AST  --   --   --   --   --  31  --   ALT  --   --   --   --   --  43*  --   ALKPHOS  --   --   --   --   --  94  --   BILITOT  --   --   --   --   --  0.5  --   PROT  --   --   --   --   --  7.7  --   ALBUMIN  --   --   --   --   --  3.2*  --   INR  --  1.02  --   --  0.99  --   --   APTT  --  42*  --   --  29  --   --     Recent Labs Lab 10/05/12 1942 10/05/12 2356 10/06/12 0356 10/06/12 0803 10/06/12 1127  GLUCAP 95 88 83 92 112*   CXR 10/7: improved right basilar atelectasis/edema CT head 10/10:  s/p VPS placement with mild decrease in hydrocephalus and slight decrease in herniation of cerebrum through craniectomy  ASSESSMENT / PLAN:  PULMONARY A:  Post op respiratory failure- S/p tracheostomy Asthma-stable P:   -Trach collar as tolerated, will change to cuffless 6 today in afternoon -albuterol PRN  CARDIOVASCULAR A: 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 Coli  P:   -Restart TF -PPI for SUP  -Oxybutynin  - d/c foley   HEMATOLOGIC A:   Anemia Hemoconcentration P:  -consider restart sub q heparin per NS -scd in place -coags reivewed  INFECTIOUS A:  Suspected aspiration pneumonia   UTI- E.coli  afebrile P:   -UA positive- E.coli, start Abx - cipro, consider 7 days           -dc foley (done)  ENDOCRINE A:   Steroid induced hyperglycemia, resolved.   Suspected adrenal insufficiency P:   -Glycemic Control Protocol, Phase 1  NEUROLOGIC A:   SAH- s/p R MCA clipping c/b R MCA territory CVA, edema and effacement Hydrocephalus- Vp shunt placed P:   - continue Keppra  - continue Fentanyl PRN  - daily neuro checks - continue PT/OT  Consider transfer to sdu today  Nathanial Rancher PA- Student [facility]   I have personally obtained history, examined patient, evaluated and interpreted laboratory and imaging results, reviewed medical records, formulated assessment / plan and placed orders.   10/10/2012, 7:12 AM  Mcarthur Rossetti. Tyson Alias, MD, FACP Pgr: [phone] [facility]

## 2012-10-10 NOTE — Progress Notes (Signed)
Pt. Arrived to unit in bed escorted by 2 RN. Moved to unit bed. Trach collar in place, on 5 liters at 28% FiO2. Suctioned via trach, small amount tan thick sputum, pt. Tolerated well. Pt. Able to squeeze right hand on command. Incontinent of urine. VSS. Will continue to monitor. Pt resting. HOB 30 for TF. Family at bedside. Belongings in closet.

## 2012-10-10 NOTE — Progress Notes (Signed)
UR completed.  Patient is not a candidate for LTACH because they do not accept Medicaid coverage. If CIR not an option, will have to consider SNF vs Home with family/HH support.  Carlyle Lipa, RN BSN MHA CCM Trauma/Neuro ICU Case Manager [phone]

## 2012-10-10 NOTE — Progress Notes (Signed)
Rehab admissions - Evaluated for possible admission.  I am following this patient for rehab needs. Will check back for progress on Monday.  Call me for questions.  #[phone]

## 2012-10-11 LAB — BASIC METABOLIC PANEL
Chloride: 101 mEq/L (ref 96–112)
Creatinine, Ser: 0.34 mg/dL — ABNORMAL LOW (ref 0.50–1.10)
GFR calc Af Amer: >90 mL/min (ref >90)
Potassium: 4.1 mEq/L (ref 3.5–5.1)

## 2012-10-11 LAB — GLUCOSE, CAPILLARY
Glucose-Capillary: 124 mg/dL — ABNORMAL HIGH (ref 70–99)
Glucose-Capillary: 138 mg/dL — ABNORMAL HIGH (ref 70–99)
Glucose-Capillary: 121 mg/dL — ABNORMAL HIGH (ref 70–99)
Glucose-Capillary: 135 mg/dL — ABNORMAL HIGH (ref 70–99)

## 2012-10-11 LAB — CBC
Hemoglobin: 10.6 g/dL — ABNORMAL LOW (ref 12.0–15.0)
Platelets: 91 10*3/uL — ABNORMAL LOW (ref 150–400)
RDW: 18.1 % — ABNORMAL HIGH (ref 11.5–15.5)
WBC: 10.1 10*3/uL (ref 4.0–10.5)

## 2012-10-11 MED ORDER — CEFAZOLIN SODIUM-DEXTROSE 2-3 GM-% IV SOLR
2.0000 g | Freq: Three times a day (TID) | INTRAVENOUS | Status: DC
  Administered 2012-10-11 – 2012-10-16 (×15): 2 g via INTRAVENOUS
  Filled 2012-10-11 (×23): qty 50

## 2012-10-11 NOTE — Progress Notes (Signed)
MD notified of axillary temp 100.2 after giving 650mg  PR Tylenol. No new orders given, will continue to monitor.  [place], [place], Charity fundraiser

## 2012-10-11 NOTE — Progress Notes (Signed)
Patient ID: Sally Nelson, female   DOB: January 14, 1985, 27 y.o.   MRN: [id number] BP 110/61  Pulse 85  Temp(Src) 98.2 F (36.8 C) (Axillary)  Resp 16  Ht 5\' 1"  (1.549 m)  Wt 50.1 kg (110 lb 7.2 oz)  BMI 20.88 kg/m2  SpO2 100% Not responsive Minimal response to noxious stimuli. Has a UTI, will treat with Ancef No neurologic changes.

## 2012-10-11 NOTE — Progress Notes (Signed)
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 PCCM continuing to follow for post op vent management.  SIGNIFICANT EVENTS / STUDIES:  9/12 OR: Craniotomy, clipping R MCA aneurysm for a [facility] 9/12 Head CT: Interval development R SDH, effacement basilar cisterns, evolving R MCA CVA, midline shift 14mm 9/23 PEG Tube, tracheostomy placement 10/1 CT head: increasing hydrocephalus, persistent herniation of right frontal and temporal lobes, mild intraventricular hemorrhage 10/6 scheduled for VP shunt placement Thursday. Continues to have ventriculostomy drainage. Trach in place 10/8 VPS shunt scheduled for tomorrow 10/9 VPS shunt scheduled. Pt has new UTI- pending urine culture 10/10 post lap assisted VPS placement.   LINES / TUBES: ETT 9/12 >>9/23  L [place]  9/12 >>>10/9 Ventriculostomy 9/17 >>>10/9 PEG 9/23 >>> Tracheostomy (jy) 9/23 >>> Foley 9/24>>> 10/10  VPS 10/9>>>  CULTURES: 9/11 MRSA>>> neg 9/12 Respirstory >>> gram neg cocci, non-pathogenic flora 9/12 Blood >>> neg 9/17 Urine >>> neg 9/18 Blood >>> neg 9/22Blood >>>Neg 9/22 Urine >>> E. Coli sensitive to Zosyn 9/22 CSF >>> neg 9/22 Respiratory >>>neg 10/8 Urine>>> E. Coli   ANTIBIOTICS: Unasyn 9/12 >>> 9/20 Vancomycin 9/22 >>> 9/29 Zosyn 9/22 >>> 9/29 Cipro 10/10>>>  SUBJECTIVE:  Remains unresponsive, no increased wob   VITAL SIGNS: Temp:  [97.6 F (36.4 C)-100.2 F (37.9 C)] 98.2 F (36.8 C) (10/11 1125) Pulse Rate:  [22-138] 85 (10/11 1125) Resp:  [13-26] 16 (10/11 1125) BP: (110-145)/(61-96) 110/61 mmHg (10/11 1125) SpO2:  [97 %-100 %] 100 % (10/11 1125) FiO2 (%):  [28 %] 28 % (10/11 0958)  VENTILATOR  SETTINGS: Vent Mode:  [-]  FiO2 (%):  [28 %] 28 %  INTAKE / OUTPUT: Intake/Output     10/10 0701 - 10/11 0700 10/11 0701 - 10/12 0700   I.V. (mL/kg) 548.3 (10.9) 146.3 (2.9)   Other 150 90   NG/GT 1186 439   IV Piggyback 400 200   Total Intake(mL/kg) 2284.3 (45.6) 875.3 (17.5)   Urine (mL/kg/hr) 150 (0.1)    Blood     Total Output 150     Net +2134.3 +875.3        Urine Occurrence 6 x 2 x    PHYSICAL EXAMINATION: Gen: wd female, nad Neuro: not responsive, minimal spont movement. HEENT: tracheostomy in place, nose without purulence PULM: faint basilar crackles. CV: Regular, 2/6 sem AB: Soft abdomen, + BS, PEG in place Ext: No edema, skin warm and dry, sacral bed sore, foot drop boots in place  Recent Labs Lab 10/07/12 0600  10/08/12 1135 10/09/12 0515 10/09/12 1004 10/10/12 0550 10/11/12 0430  HGB 10.3*  --   --  11.1* 11.4* 11.2* 10.6*  WBC 9.5  --   --  8.8 9.5 11.3* 10.1  PLT 121*  --   --  112* 105* 100* 91*  NA 143  < > 139 140 139 139 140  K 3.1*  < > 4.2 3.9 4.2 4.3 4.1  CL 98  < > 98 97 97 99 101  CO2 38*  < > 37* 38* 35* 30 29  GLUCOSE 103*  < > 113* 116* 99 91 124*  BUN 19  < > 22 23 22 14 16   CREATININE  0.36*  < > 0.37* 0.35* 0.38* 0.49* 0.34*  CALCIUM 9.3  < > 9.6 10.0 10.1 9.5 9.3  MG 1.9  --   --   --   --   --   --   PHOS 4.2  --   --   --   --   --   --   AST  --   --   --   --  31  --   --   ALT  --   --   --   --  43*  --   --   ALKPHOS  --   --   --   --  94  --   --   BILITOT  --   --   --   --  0.5  --   --   PROT  --   --   --   --  7.7  --   --   ALBUMIN  --   --   --   --  3.2*  --   --   INR 1.02  --   --  0.99  --   --   --   APTT 42*  --   --  29  --   --   --   < > = values in this interval not displayed.  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   HEMATOLOGIC A:   Anemia Has decreased platelets unclear etiology.  Will follow P:  -monitor platelets. Check again on monday  INFECTIOUS A:  Suspected aspiration pneumonia   UTI- E.coli  afebrile P:   -UA positive- E.coli, start Abx - cipro, consider 7 days           -dc foley (done)  ENDOCRINE A:   Steroid induced hyperglycemia, resolved.   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

## 2012-10-12 LAB — GLUCOSE, CAPILLARY
Glucose-Capillary: 110 mg/dL — ABNORMAL HIGH (ref 70–99)
Glucose-Capillary: 127 mg/dL — ABNORMAL HIGH (ref 70–99)
Glucose-Capillary: 131 mg/dL — ABNORMAL HIGH (ref 70–99)
Glucose-Capillary: 142 mg/dL — ABNORMAL HIGH (ref 70–99)

## 2012-10-12 MED ORDER — LOPERAMIDE HCL 1 MG/5ML PO LIQD
4.0000 mg | ORAL | Status: DC | PRN
  Filled 2012-10-12: qty 20

## 2012-10-12 MED ORDER — MAGIC MOUTHWASH
5.0000 mL | Freq: Three times a day (TID) | ORAL | Status: DC
  Administered 2012-10-12 (×2): 5 mL via ORAL
  Administered 2012-10-13: 10:00:00 via ORAL
  Administered 2012-10-13 – 2012-10-27 (×40): 5 mL via ORAL
  Filled 2012-10-12 (×49): qty 5

## 2012-10-12 NOTE — Progress Notes (Signed)
Patient ID: Sally Nelson, female   DOB: [birth year], 27 y.o.   MRN: [id number] BP 116/80  Pulse 74  Temp(Src) 98.1 F (36.7 C) (Axillary)  Resp 16  Ht 5\' 1"  (1.549 m)  Wt 53.4 kg (117 lb 11.6 oz)  BMI 22.26 kg/m2  SpO2 99% Non verbal, appears to follow commands Certainly purposeful with right upper extremity Will prn imodium Improving Wounds are cleAn and dry

## 2012-10-13 ENCOUNTER — Inpatient Hospital Stay (HOSPITAL_COMMUNITY): Payer: Medicaid Other

## 2012-10-13 LAB — GLUCOSE, CAPILLARY
Glucose-Capillary: 141 mg/dL — ABNORMAL HIGH (ref 70–99)
Glucose-Capillary: 131 mg/dL — ABNORMAL HIGH (ref 70–99)
Glucose-Capillary: 152 mg/dL — ABNORMAL HIGH (ref 70–99)

## 2012-10-13 LAB — CBC
HCT: 29.6 % — ABNORMAL LOW (ref 36.0–46.0)
Hemoglobin: 10.2 g/dL — ABNORMAL LOW (ref 12.0–15.0)
MCV: 83.1 fL (ref 78.0–100.0)
Platelets: 84 10*3/uL — ABNORMAL LOW (ref 150–400)
RBC: 3.56 MIL/uL — ABNORMAL LOW (ref 3.87–5.11)
WBC: 7.1 10*3/uL (ref 4.0–10.5)

## 2012-10-13 MED ORDER — FUROSEMIDE 10 MG/ML PO SOLN
20.0000 mg | Freq: Every day | ORAL | Status: DC
  Administered 2012-10-13 – 2012-10-14 (×2): 20 mg
  Filled 2012-10-13 (×3): qty 2

## 2012-10-13 MED ORDER — OSMOLITE 1.2 CAL PO LIQD
1000.0000 mL | ORAL | Status: DC
  Administered 2012-10-14: 1000 mL
  Filled 2012-10-13 (×4): qty 1000

## 2012-10-13 NOTE — Progress Notes (Signed)
Patient ID: Sally Nelson, female   DOB: [birth year], 27 y.o.   MRN: [id number] Subjective:  Called by the patient's nurse this afternoon. I was told that the patient had some seizure-like activity and a decreased mental status. I ordered a stat head CT scan at that the patient be transferred to the neurosurgical ICU. I evaluated the patient while she was in the CT scanner.  Objective: Vital signs in last 24 hours: Temp:  [97.2 F (36.2 C)-98.6 F (37 C)] 97.7 F (36.5 C) (10/13 1730) Pulse Rate:  [71-106] 106 (10/13 1600) Resp:  [13-22] 14 (10/13 1600) BP: (113-129)/(70-84) 129/84 mmHg (10/13 0807) SpO2:  [98 %-100 %] 99 % (10/13 1600) FiO2 (%):  [28 %] 28 % (10/13 1600) Weight:  [46.7 kg (102 lb 15.3 oz)] 46.7 kg (102 lb 15.3 oz) (10/13 0435)  Intake/Output from previous day: 10/12 0701 - 10/13 0700 In: 1830 [I.V.:240; NG/GT:1380; IV Piggyback:150] Out: -  Intake/Output this shift:    Physical exam the patient is Glasgow Coma Scale 9 (E4M4V1) trached. Her pupils are approximately 4 mm on the left and 3 numerous on the right there is sluggish bilaterally. She abnormally flexes bilaterally to pain. Her scalp flap is tense, protuberant, and swollen.  I reviewed the patient's CT performed this evening at [facility] without contrast. It demonstrates blood in it the intraventricular system with obstructive hydrocephalus. There is some blood in the right temporal lobe .  Lab Results:  Recent Labs  10/11/12 0430 10/13/12 0424  WBC 10.1 7.1  HGB 10.6* 10.2*  HCT 30.0* 29.6*  PLT 91* 84*   BMET  Recent Labs  10/11/12 0430  NA 140  K 4.1  CL 101  CO2 29  GLUCOSE 124*  BUN 16  CREATININE 0.34*  CALCIUM 9.3    Studies/Results: Ct Head Wo Contrast  10/13/2012   CLINICAL DATA:  Sudden onset of seizures and decreased level of consciousness.  EXAM: CT HEAD WITHOUT CONTRAST  TECHNIQUE: Contiguous axial images were obtained from the base of the skull through the  vertex without contrast.  COMPARISON:  Multiple priors, most recent 10/10/2012.  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 Due to brain swelling, the craniotomy flap was removed, and the infarcted brain has herniated through the defect. More recently, there was placement of a ventricular peritoneal shunt for hydrocephalus via a left frontal approach.  Today's examination reveals marked intracerebral and intraventricular hemorrhage originating in the right temporal lobe. The ventricular system demonstrates marked increase in size. The ventricular system is a cast of blood. 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  Review of the prior MRI from [facility] from 2008 revealed no evidence for MCA aneurysm that time. 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  IMPRESSION: Aneurysmal re-hemorrhage with marked intraventricular blood and increased hydrocephalus. Increased mass effect outward from parenchymal hemorrhage.  These results were called by telephone at the time of interpretation on 10/13/2012 at 6:55 PM to Dr. Tressie Stalker , who verbally acknowledged these results.   Electronically Signed   By: Davonna Belling M.Nelson.   On: 10/13/2012 19:00   Dg Abd Portable 1v  10/13/2012   CLINICAL DATA:  Vomiting.  EXAM: PORTABLE ABDOMEN -  1 VIEW  COMPARISON:  09/12/2012 and abdomen and pelvis CT dated 09/24/2012.  FINDINGS: Breathing motion blurring. Grossly normal bowel gas pattern. A percutaneous gastrostomy tube is in place. Also noted is a ventriculoperitoneal shunt catheter with its tip in the left upper abdomen. Previously demonstrated right iliac bone island.  IMPRESSION: No acute  abnormality.   Electronically Signed   By: Gordan Payment M.Nelson.   On: 10/13/2012 17:07    Assessment/Plan: Intraventricular hemorrhage, hydrocephalus: I discussed situation with the patient's father. We have discussed the various treatment options. I have recommended placement of a ventriculostomy as the patient's shunt does not appear to be working. I explained the procedure and the risks including risks of hemorrhage, infection, tracheostomy malplacement or malfunction, seizures, etc. I have answered all his questions. He consented on behalf of the patient.  Rebleed: This is concerning for a rupture of her aneurysm. She will need further workup including a cerebral arteriogram once she is is stabilized. I discussed situation with Dr. Conchita Paris  LOS: 32 days     Sally Nelson 10/13/2012, 7:32 PM

## 2012-10-13 NOTE — Progress Notes (Signed)
Pt seen and examined. No issues overnight.   EXAM: Temp:  [97.2 F (36.2 C)-98.6 F (37 C)] 97.2 F (36.2 C) (10/13 0807) Pulse Rate:  [71-99] 71 (10/13 1143) Resp:  [13-22] 16 (10/13 1143) BP: (113-133)/(70-84) 129/84 mmHg (10/13 0807) SpO2:  [98 %-100 %] 100 % (10/13 1143) FiO2 (%):  [28 %] 28 % (10/13 1143) Weight:  [46.7 kg (102 lb 15.3 oz)] 46.7 kg (102 lb 15.3 oz) (10/13 0435) Intake/Output     10/12 0701 - 10/13 0700 10/13 0701 - 10/14 0700   I.V. (mL/kg) 240 (5.1)    Other 60    NG/GT 1380    IV Piggyback 150    Total Intake(mL/kg) 1830 (39.2)    Net +1830          Urine Occurrence 13 x     Awake, alert Shows thumbs-up to answer simple questions Trached on collar Follows commands RUE/RLE.  Wounds c/d/i  LABS: Lab Results  Component Value Date   CREATININE 0.34* 10/11/2012   BUN 16 10/11/2012   NA 140 10/11/2012   K 4.1 10/11/2012   CL 101 10/11/2012   CO2 29 10/11/2012   Lab Results  Component Value Date   WBC 7.1 10/13/2012   HGB 10.2* 10/13/2012   HCT 29.6* 10/13/2012   MCV 83.1 10/13/2012   PLT 84* 10/13/2012    IMPRESSION: - 27 y.o. female SAH d# 75 s/p VPS - Neurologically stable  PLAN: - Given pts young age, she would be an excellent candidate for physical and neurocognitive inpatient rehab. - Cont current supportive care - Cont PT/OT/SLP

## 2012-10-13 NOTE — Progress Notes (Signed)
Walked into room with pt. Making jerky movements and hr elevated to 140's.  This incidient only lasted about 3 minutes.  Now she is more lethargic; temp is 97.7 axillary.  HR returned to 70's.  R pupil non reactive at a 4; L pupil very sluggish at a 3.  MD paged; will continue to monitor.

## 2012-10-13 NOTE — Progress Notes (Addendum)
Occupational Therapy Treatment Patient Details Name: Sally Nelson MRN: [id number] DOB: [birth year] Today's Date: 10/13/2012 Time: 4098-1191 OT Time Calculation (min): 58 min  OT Assessment / Plan / Recommendation  History of present illness Pt s/p shunt placement thursday 10/9. 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     OT comments  Making steady progress. More alert and participatory s/p shunt. Pt able to bring eyes to midline for longer sustained periods this session. Using R hand to manipulation pictures/videos/music on cell phone appropriately. Able to comb hair with trunk/head support using R hand. Increased tone LUE.  Observed spontaneous movement for the first time today in LUE. Will continue to follow. Goals updated.  If pt begins to make more progress, may be a CIR candidate. Otherwise, feel pt would benefit from [facility]. FAther would like to be able to care for his daughter at home.  Follow Up Recommendations  CIR    Barriers to Discharge       Equipment Recommendations  3 in 1 bedside comode;Tub/shower bench;Wheelchair (measurements OT);Wheelchair cushion (measurements OT);Hospital bed    Recommendations for Other Services Rehab consult  Frequency Min 3X/week   Progress towards OT Goals Progress towards OT goals: Progressing toward goals (goals updated)  Plan Discharge plan remains appropriate    Precautions / Restrictions Precautions Precautions: Fall Precaution Comments: no bone flap R side Restrictions Weight Bearing Restrictions: No   Pertinent Vitals/Pain Vitals stable    ADL  Eating/Feeding: NPO Grooming: Wash/dry hands;Wash/dry face;Brushing hair Transfers/Ambulation Related to ADLs: +2 total A. squat pivot to R ADL Comments: focus of session on trunk and head control EOB, following commands, visual attention. when gien choice of 3 items, pt able to  choose comb when asked what do you comb your hair with...Marland KitchenPt then combed hair as she could.. Pt given cell phone to view pictures. Pt appropriately scanning through pictures using R thumb to scan and double tapping to enlarge pictures.     OT Diagnosis:    OT Problem List:   OT Treatment Interventions:     OT Goals(current goals can now be found in the care plan section) Acute Rehab OT Goals Patient Stated Goal: unable to state OT Goal Formulation: With family Time For Goal Achievement: 10/27/12 Potential to Achieve Goals: Good ADL Goals Pt Will Perform Grooming:  (Pt will wash face and hands with min A supported sitting) Additional ADL Goal #1: Pt will maintain sustained attention to task x 2 min with eyes at midline Additional ADL Goal #2: pt will maintain head control in supported sitting with mod A Additional ADL Goal #3: Demonstrate visual scanning to locate target in L visual  field with mod cuing. Additional ADL Goal #4: Family to complete PROM LUE with min A  Visit Information  Last OT Received On: 10/13/12 Assistance Needed: +2 History of Present Illness: Pt s/p shunt placement thursday 10/9. 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      Subjective Data      Prior Functioning       Cognition  Cognition Arousal/Alertness: Awake/alert Behavior During Therapy: Flat affect Overall Cognitive Status: Impaired/Different from baseline Area of Impairment: Attention;Following commands;Problem solving;Awareness Current Attention Level: Sustained (looking at pictures/videos on cell phone) Following Commands: Follows one step commands inconsistently Awareness: Intellectual Problem  Solving: Slow processing;Decreased initiation;Difficulty sequencing;Requires verbal cues;Requires tactile cues General Comments: increased alertness and ability to follow commands today.     Mobility  Bed Mobility Bed Mobility: Rolling Left;Left Sidelying to Sit;Sitting - Scoot to Edge of Bed Rolling Left: 1: +2 Total assist Rolling Left: Patient Percentage: 10% Left Sidelying to Sit: 1: +2 Total assist Left Sidelying to Sit: Patient Percentage: 10% Sitting - Scoot to Edge of Bed: 1: +2 Total assist Sitting - Scoot to Edge of Bed: Patient Percentage: 0% Details for Bed Mobility Assistance: Pt assisting by holding on with R hand Transfers Sit to Stand: 1: +2 Total assist;From bed Sit to Stand: Patient Percentage: 10% Stand to Sit: 1: +2 Total assist;To bed Stand to Sit: Patient Percentage: 10% Details for Transfer Assistance: Total A squat pivot to R to recliner, Improved head control with suport of chair    Exercises  Other Exercises Other Exercises: spontaneous movement LUE when in chair.  Other Exercises: RUE A/AAROM during funcitonal activities. Other Exercises: swinging legs when up in chair   Balance Balance Balance Assessed: Yes Static Sitting Balance Static Sitting - Balance Support: Feet supported Static Sitting - Level of Assistance: 1: +1 Total assist Static Sitting - Comment/# of Minutes: sat EOB x 30 min  with MAx assist for trunk and head control Propping with R UE.    End of Session OT - End of Session Equipment Utilized During Treatment: Gait belt Activity Tolerance: Patient tolerated treatment well Patient left: in chair;with call bell/phone within reach;with family/visitor present Nurse Communication: Mobility status;Other (comment) (progress)  GO     Sally Nelson,[name] 10/13/2012, 1:47 PM [facility], OTR/L  [phone] 10/13/2012

## 2012-10-13 NOTE — Progress Notes (Addendum)
Tech called for nurse into the room; patient vomiting as I entered.  Tube feed residual at last check was 60; pt's heart rate was elevated to 140 while vomiting.  Suctioned out a small amount of thick secretions.  HR returned back to baseline as well as O2.  MD was notified, received orders.  Tube feed residuals now currently 0.  Will continue to monitor.  Vivi Martens RN

## 2012-10-13 NOTE — Progress Notes (Signed)
Pt received from 3300 at 1845. Pt prepared for ventriculostomy. Consent received from pts father. Pt was awake, stable on trach collar, not following commands, vital signs stable.

## 2012-10-13 NOTE — Progress Notes (Signed)
eLink Physician-Brief Progress Note Patient Name: Marisel Tostenson DOB: 1985/04/25 MRN: [id number]  Date of Service  10/13/2012   HPI/Events of Note  suidden first episode nvomit. NO aspiration signs on exam   eICU Interventions  Reduce tube feeds from 60 to 20cc/h Check ABD Xray portable   Intervention Category Intermediate Interventions: Other:  Alif Petrak 10/13/2012, 4:22 PM

## 2012-10-13 NOTE — Progress Notes (Signed)
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 PCCM continuing to follow for post op vent management.  SIGNIFICANT EVENTS / STUDIES:  9/12 OR: Craniotomy, clipping R MCA aneurysm for a [facility] 9/12 Head CT: Interval development R SDH, effacement basilar cisterns, evolving R MCA CVA, midline shift 14mm 9/23 PEG Tube, tracheostomy placement 10/1 CT head: increasing hydrocephalus, persistent herniation of right frontal and temporal lobes, mild intraventricular hemorrhage 10/6 scheduled for VP shunt placement Thursday. Continues to have ventriculostomy drainage. Trach in place 10/8 VPS shunt scheduled for tomorrow 10/9 VPS shunt scheduled. Pt has new UTI- pending urine culture 10/10 post lap assisted VPS placement.   LINES / TUBES: ETT 9/12 >>9/23  L [place]  9/12 >>>10/9 Ventriculostomy 9/17 >>>10/9 PEG 9/23 >>> Tracheostomy (jy) 9/23 >>> Foley 9/24>>> 10/10  VPS 10/9>>>  CULTURES: 9/11 MRSA>>> neg 9/12 Respirstory >>> gram neg cocci, non-pathogenic flora .... 9/22 Urine >>> E. Coli sensitive to Zosyn 9/22 CSF >>> neg 9/22 Respiratory >>>neg 10/8 Urine>>> E. Coli   ANTIBIOTICS: Unasyn 9/12 >>> 9/20 Vancomycin 9/22 >>> 9/29 Zosyn 9/22 >>> 9/29 Cipro 10/10>>>10/11 Cefazolin 10/11>>  SUBJECTIVE:  Afebrile, no increased wob   VITAL SIGNS: Temp:  [97.2 F (36.2 C)-98.6 F (37 C)] 97.2 F (36.2 C) (10/13 0807) Pulse Rate:  [73-99] 92 (10/13 0807) Resp:  [13-22] 22 (10/13 0807) BP: (113-133)/(70-84) 129/84 mmHg (10/13 0807) SpO2:  [98 %-100 %] 100 % (10/13 0807) FiO2 (%):  [28 %] 28 % (10/13 0807) Weight:  [46.7 kg (102 lb 15.3 oz)] 46.7 kg (102 lb 15.3 oz) (10/13  0435)  VENTILATOR SETTINGS: Vent Mode:  [-]  FiO2 (%):  [28 %] 28 %  INTAKE / OUTPUT: Intake/Output     10/12 0701 - 10/13 0700 10/13 0701 - 10/14 0700   I.V. (mL/kg) 240 (5.1)    Other 60    NG/GT 1380    IV Piggyback 150    Total Intake(mL/kg) 1830 (39.2)    Net +1830          Urine Occurrence 13 x     PHYSICAL EXAMINATION: Gen: wd female, nad Neuro: not responsive, minimal spont movement. HEENT: tracheostomy in place, nose without purulence PULM: faint basilar crackles. CV: Regular, 2/6 sem AB: Soft abdomen, + BS, PEG in place Ext: No edema, skin warm and dry, sacral bed sore, foot drop boots in place  Recent Labs Lab 10/07/12 0600  10/08/12 1135 10/09/12 0515 10/09/12 1004 10/10/12 0550 10/11/12 0430 10/13/12 0424  HGB 10.3*  --   --  11.1* 11.4* 11.2* 10.6* 10.2*  WBC 9.5  --   --  8.8 9.5 11.3* 10.1 7.1  PLT 121*  --   --  112* 105* 100* 91* 84*  NA 143  < > 139 140 139 139 140  --   K 3.1*  < > 4.2 3.9 4.2 4.3 4.1  --   CL 98  < > 98 97 97 99 101  --   CO2 38*  < > 37* 38* 35* 30 29  --   GLUCOSE 103*  < > 113* 116* 99 91 124*  --   BUN 19  < > 22 23 22  14  16  --   CREATININE 0.36*  < > 0.37* 0.35* 0.38* 0.49* 0.34*  --   CALCIUM 9.3  < > 9.6 10.0 10.1 9.5 9.3  --   MG 1.9  --   --   --   --   --   --   --   PHOS 4.2  --   --   --   --   --   --   --   AST  --   --   --   --  31  --   --   --   ALT  --   --   --   --  43*  --   --   --   ALKPHOS  --   --   --   --  94  --   --   --   BILITOT  --   --   --   --  0.5  --   --   --   PROT  --   --   --   --  7.7  --   --   --   ALBUMIN  --   --   --   --  3.2*  --   --   --   INR 1.02  --   --  0.99  --   --   --   --   APTT 42*  --   --  29  --   --   --   --   < > = values in this interval not displayed.  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   P:  -monitor platelets, no heparin  INFECTIOUS A:  Suspected aspiration pneumonia   UTI- E.coli  afebrile P:   -UA positive- E.coli, start Abx - cefazolin, consider 7 days           -dc foley (done)  ENDOCRINE A:   Steroid induced hyperglycemia, resolved.   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 FCCP. [facility] Pager [phone] If no response call 319 [phone]

## 2012-10-13 NOTE — Progress Notes (Signed)
Rehab admissions - I spoke with case manager on 3S this am.  I think that pursuit of rehab in [place] is a good option.  They take very low level patients such as this patient.  Patient has medicaid so likely LTACH will not be possible.  I will continue to follow for therapy tolerance and participation.  Call me for questions.  #[phone]

## 2012-10-13 NOTE — Progress Notes (Signed)
While speaking with Dr. Lovell Sheehan on the phone, pt. Had another episode of seizure like activity.  Lasted about 3 minutes; pt. Lethargic and not responding to commands; order received for stat head ct without contrast then tx. To 3M04.  RR called as well; will continue to monitor.  Vivi Martens RN

## 2012-10-13 NOTE — Progress Notes (Signed)
Utilization review completed.  

## 2012-10-13 NOTE — Progress Notes (Addendum)
Speech Language Pathology Treatment: Sally Nelson Speaking valve;Cognitive-Linquistic  Patient Details Name: Sally Nelson MRN: [id number] DOB: 05/07/1985 Today's Date: 10/13/2012 Time: 1191-4782 SLP Time Calculation (min): 20 min  Assessment / Plan / Recommendation Clinical Impression  Pt. aroused with total verbal/visual/tactile stimuli and maintained eye opening throughout the session. Total tactile cues and additional processing time given without attempts at gestural responses to basic questions.  Pt. Tolerated placement of valve for 10-15 minutes without indications of Co2 retention with removal of valve.  RR 16-35, HR 86 and Spo2 100% during session.  Airflow present in upper airway with tactile cues during automatic speech sequences/singing did not result in vocalization.  Pt. Lips remained parted and mobilized several times.  Inhalation is shallow.  She followed one command when asked to look at SLP (delayed).  Right gaze preference.  SLP will continue to treat for speech-cognitive improvements.   HPI     Pertinent Vitals HR an Sp02 WNL, RR 16-35  SLP Plan  Continue with current plan of care    Recommendations  TBD      Patient may use Passy-Muir Speech Valve: with SLP only PMSV Supervision: Full       Oral Care Recommendations: Oral care BID Follow up Recommendations:  ((TBD)) Plan: Continue with current plan of care    GO     Royce Macadamia M.Ed [employer] [phone]  10/13/2012

## 2012-10-13 NOTE — Op Note (Signed)
Brief history: The patient is a 27 year old black female who has presented with a subarachnoid hemorrhage. 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 This was managed with a ventriculostomy and eventual placement of a ventriculoperitoneal shunt. The patient had some seizure activity and decreased mental status this evening. A stat head CT scan demonstrated intraventricular hemorrhage and hydrocephalus. I discussed situation with the patient's father and recommended placement of a ventriculostomy. I explained the procedure as well as the risks, benefits, and alternatives. I answered all his questions and he has consented on behalf of the patient for placement of a ventriculostomy.  Preop diagnosis: Intracranial hemorrhage, intraventricular hemorrhage, hydrocephalus  Postop diagnosis: The same  Procedure: Placement of a right frontal ventriculostomy  Surgeon: Dr. Delma Officer  Asst.: None  Anesthesia: Local  Specimens: None  Complications: None  Description of procedure: The patient's right frontal scalp was shaved with clippers and then prepared with DuraPrep. Sterile drapes were applied. I then injected the area to be incised with lidocaine solution. I then used a scalpel to make an incision at approximately the mid pupillary line over the patient's craniectomy site. I used the self-retaining retractor for exposure. I then carefully cut deeper until I exposed the underlying cerebrum. I then cannulated the ventricular system with a ventriculostomy on the first pass. I then tunneled the distal end of the ventriculostomy using the trocar. I attached the ventriculostomy to the drainage system. There was good flow of bloody CSF. I then removed the retractor and reapproximated patient's scalp with a running 3-0 nylon suture. I secured the ventriculostomy with several  sutures. The drapes were removed and a sterile dressing was applied. The patient tolerated the procedure well without change in her neurologic status. Her scalp is now less tense.

## 2012-10-13 NOTE — Care Management Note (Signed)
    Page 1 of 2   10/15/2012     1:12:19 PM   CARE MANAGEMENT NOTE 10/15/2012  Patient:  Sally Nelson, Sally Nelson   Account Number:  [id number]  Date Initiated:  09/15/2012  Documentation initiated by:  Carlyle Lipa  Subjective/Objective Assessment:   R decompressive crani for spontaneous SAH; remains on vent, 3% Na gtt in ICU     Action/Plan:   await stability to determine next best level of care   Anticipated DC Date:  10/15/2012   Anticipated DC Plan:  IP REHAB FACILITY  In-house referral  Clinical Social Worker         Choice offered to / List presented to:             Status of service:  In process, will continue to follow Medicare Important Message given?   (If response is "NO", the following Medicare IM given date fields will be blank) Date Medicare IM given:   Date Additional Medicare IM given:    Discharge Disposition:    Per UR Regulation:  Reviewed for med. necessity/level of care/duration of stay  If discussed at Long Length of Stay Meetings, dates discussed:   09/18/2012  09/23/2012  09/25/2012  09/30/2012  10/02/2012  10/07/2012  10/09/2012  10/14/2012    Comments:  10/15/12- 1100- Donn Pierini RN, BSN [phone] Received phone call from [facility] CIR in [place]- spoke with Dario Guardian who had reviewed paperwork sent- informed her that pt had returned to ICU- she stated that she would follow along from afar for possible admission if pt progressed back out of ICU- she would need updated progress notes and therapy notes when pt able to participate again- notes could be faxed atten: Dario Guardian.  10/13/12- 1430- Donn Pierini RN, BSN [phone] Per Genie with [facility]- pt is at this time not a candidate for program here- suggest CIR in [place]- spoke with pt's dad Thayer Ohm at bedside who is agreeable to search for most suitable program for his daughter-would like closest program possible but understands that may be in [place]- Call made to [facility] INPT rehab in [place]-  and pt's info faxed to admissions for review - will await word to see if pt may be a candidate for their program and see if they offer bed.   Carlyle Lipa, RN BSN [facility] CCM [phone] 10/10/2012 Carlyle Lipa, RN BSN [facility] CCM 1049--Pt had shunt placed yesterday afternoon. Has progressed since OR and likely for stepdown unit today. CIR still plan for d/c.  10/07/2012 Carlyle Lipa, RN BSN [facility] CCM 1108--Pt to have shunt placed this week.  Tentatively scheduled for Thursday pm after which she will be able to transfer to CIR.  09/25/2012 Carlyle Lipa, RN BSN [facility] CCM 1027--Pt remains in ICU. Trach/PEG placed on 9/23.  Later same day, Hgb down 3 points, SBP to 60's, sats down.  On full vent support, 3 units RBCs in and Levophed gtt running.  09/22/2012 Carlyle Lipa, RN BSN [facility] CCM 1320--Pt still in ICU on vent. Trach and PEG planned for tomorrow. Pt will be CIR vs SNF depending on recovery.  09/18/2012  Carlyle Lipa, RN BSN [facility] CCM 1112 Pt remains in ICU on vent on day 7.  Likely to have trach and PEG planned. Await medical stability to determne exact d/c needs.

## 2012-10-13 NOTE — Progress Notes (Signed)
Patient ID: Sally Nelson, female   DOB: 03-10-1985, 27 y.o.   MRN: [id number] I spoke with the patient's father and updated him on the patient's condition. I have answered all his questions.

## 2012-10-13 NOTE — Progress Notes (Signed)
Physical Therapy Treatment Patient Details Name: Sally Nelson MRN: [id number] DOB: 02-12-1985 Today's Date: 10/13/2012 Time: 4098-1191 PT Time Calculation (min): 56 min  PT Assessment / Plan / Recommendation  History of Present Illness Pt s/p shunt placement thursday 10/9. 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     PT Comments   Pt with much improved command follow and use of all four extremities this date. Pt able to engage in activities with OT such as putting lotion on and managing and iphone. Pt remains requires total assist for all mobility. Pt con't to be an excellent candidate for CIR to maximize functional recovery.   Follow Up Recommendations  CIR;Supervision/Assistance - 24 hour     Does the patient have the potential to tolerate intense rehabilitation     Barriers to Discharge        Equipment Recommendations  Other (comment)    Recommendations for Other Services Rehab consult  Frequency Min 3X/week   Progress towards PT Goals Progress towards PT goals: Progressing toward goals  Plan Current plan remains appropriate    Precautions / Restrictions Precautions Precautions: Fall Restrictions Weight Bearing Restrictions: No   Pertinent Vitals/Pain Denies pain    Mobility  Bed Mobility Bed Mobility: Rolling Left;Left Sidelying to Sit;Sitting - Scoot to [place] of Bed Rolling Left: 1: +2 Total assist Rolling Left: Patient Percentage: 10% Left Sidelying to Sit: 1: +2 Total assist;HOB elevated Left Sidelying to Sit: Patient Percentage: 10% Sitting - Scoot to Edge of Bed: 1: +2 Total assist Sitting - Scoot to Edge of Bed: Patient Percentage: 0% Details for Bed Mobility Assistance: pt con't to require maxA for trunk elevation and bring LEs off EOB Transfers Transfers: Sit to Stand;Stand to Sit;Squat Pivot Transfers Sit to Stand: 1: +2 Total assist;From  bed Sit to Stand: Patient Percentage: 10% Stand to Sit: 1: +2 Total assist;To bed Stand to Sit: Patient Percentage: 10% Squat Pivot Transfers: 1: +2 Total assist Squat Pivot Transfers: Patient Percentage: 0% Details for Transfer Assistance: pt remains unable to hold head up or achieve full WBing thru LES    Exercises Other Exercises Other Exercises: pt beginning to move both LEs once in chair. Pt able to push into plantar flexion with R foot, pt beginning to move L foot. Pt able to complete 3 LAQ on R.   PT Diagnosis:    PT Problem List:   PT Treatment Interventions:     PT Goals (current goals can now be found in the care plan section) Acute Rehab PT Goals Time For Goal Achievement: 10/27/12 Potential to Achieve Goals: Good  Visit Information  Last PT Received On: 10/13/12 Assistance Needed: +2 PT/OT Co-Evaluation/Treatment: Yes History of Present Illness: Pt s/p shunt placement thursday 10/9. 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      Subjective Data      Cognition  Cognition Arousal/Alertness: Awake/alert (but pt sleeping upon arrival) Behavior During Therapy: Flat affect Overall Cognitive Status: Impaired/Different from baseline Area of Impairment: Problem solving Current Attention Level: Focused Following Commands: Follows one step commands inconsistently Problem Solving: Slow processing;Decreased initiation;Difficulty sequencing;Requires verbal cues;Requires tactile cues General Comments: pt with improved overall command following but not 100% yet    Balance  Balance Balance Assessed: Yes Static Sitting Balance Static Sitting -  Balance Support: Feet supported Static Sitting - Level of Assistance: 1: +1 Total assist Static Sitting - Comment/# of Minutes: sat EOB x 30 min with max tactile cues to stimulate back extensor as well as cervical extensors. Pt able  to assist with turning head to the L but not to the R. Pt remains maxA to maintain head in upright position. Worked on self-correction to maintain midline posture however pt con't to have L posterior lateral lean and requires maxA to mantain midline.  End of Session PT - End of Session Equipment Utilized During Treatment: Oxygen Activity Tolerance: Patient tolerated treatment well Patient left: in bed;with call bell/phone within reach;with nursing/sitter in room Nurse Communication: Mobility status;Need for lift equipment (pad left in room)   GP     Marcene Brawn 10/13/2012, 1:14 PM  Lewis Shock, PT, DPT Pager #: [phone] Office #: [phone]

## 2012-10-14 ENCOUNTER — Inpatient Hospital Stay (HOSPITAL_COMMUNITY): Payer: Medicaid Other

## 2012-10-14 DIAGNOSIS — I619 Nontraumatic intracerebral hemorrhage, unspecified: Secondary | ICD-10-CM

## 2012-10-14 LAB — CBC
HCT: 29.9 % — ABNORMAL LOW (ref 36.0–46.0)
Hemoglobin: 10.6 g/dL — ABNORMAL LOW (ref 12.0–15.0)
MCHC: 35.5 g/dL (ref 30.0–36.0)
Platelets: 102 10*3/uL — ABNORMAL LOW (ref 150–400)
RBC: 3.66 MIL/uL — ABNORMAL LOW (ref 3.87–5.11)
WBC: 17.2 10*3/uL — ABNORMAL HIGH (ref 4.0–10.5)

## 2012-10-14 LAB — BASIC METABOLIC PANEL
BUN: 17 mg/dL (ref 6–23)
Chloride: 95 mEq/L — ABNORMAL LOW (ref 96–112)
Creatinine, Ser: 0.29 mg/dL — ABNORMAL LOW (ref 0.50–1.10)
GFR calc Af Amer: >90 mL/min (ref >90)
GFR calc non Af Amer: >90 mL/min (ref >90)
Potassium: 3.9 mEq/L (ref 3.5–5.1)
Sodium: 136 mEq/L (ref 135–145)

## 2012-10-14 LAB — GLUCOSE, CAPILLARY: Glucose-Capillary: 102 mg/dL — ABNORMAL HIGH (ref 70–99)

## 2012-10-14 MED ORDER — DIPHENHYDRAMINE HCL 50 MG/ML IJ SOLN
25.0000 mg | INTRAMUSCULAR | Status: AC
  Administered 2012-10-14: 25 mg via INTRAVENOUS
  Filled 2012-10-14: qty 1

## 2012-10-14 MED ORDER — METHYLPREDNISOLONE SODIUM SUCC 125 MG IJ SOLR
125.0000 mg | INTRAMUSCULAR | Status: AC
  Administered 2012-10-14: 125 mg via INTRAVENOUS
  Filled 2012-10-14: qty 2

## 2012-10-14 MED ORDER — IOHEXOL 300 MG/ML  SOLN
150.0000 mL | Freq: Once | INTRAMUSCULAR | Status: AC | PRN
  Administered 2012-10-14: 80 mL via INTRA_ARTERIAL

## 2012-10-14 MED ORDER — LABETALOL HCL 5 MG/ML IV SOLN
10.0000 mg | INTRAVENOUS | Status: DC | PRN
  Administered 2012-10-18 – 2012-10-25 (×3): 10 mg via INTRAVENOUS
  Filled 2012-10-14 (×3): qty 4

## 2012-10-14 MED ORDER — MIDAZOLAM HCL 2 MG/2ML IJ SOLN
INTRAMUSCULAR | Status: AC | PRN
  Administered 2012-10-14 (×2): 0.5 mg via INTRAVENOUS

## 2012-10-14 MED ORDER — FENTANYL CITRATE 0.05 MG/ML IJ SOLN
INTRAMUSCULAR | Status: AC | PRN
  Administered 2012-10-14 (×2): 25 ug via INTRAVENOUS

## 2012-10-14 MED ORDER — MIDAZOLAM HCL 2 MG/2ML IJ SOLN
INTRAMUSCULAR | Status: AC
  Filled 2012-10-14: qty 2

## 2012-10-14 MED ORDER — FAMOTIDINE IN NACL 20-0.9 MG/50ML-% IV SOLN
20.0000 mg | INTRAVENOUS | Status: AC
Start: 2012-10-14 — End: 2012-10-14
  Administered 2012-10-14: 20 mg via INTRAVENOUS
  Filled 2012-10-14: qty 50

## 2012-10-14 MED ORDER — FENTANYL CITRATE 0.05 MG/ML IJ SOLN
INTRAMUSCULAR | Status: AC
  Filled 2012-10-14: qty 2

## 2012-10-14 NOTE — Consult Note (Signed)
Referring Physician: Dr. Conchita Paris    Chief Complaint: Intraventricular Hemorrhage  HPI: Sally Nelson is an 27 y.o. 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 Patient was taken for emergent craniectomy for decompression in setting of intracranial hypertension. Treatments included concentrated saline, trach, PEG. She was kept on anticonvulsants, vasospasm prophylaxis and TCDs q MWF. Treated for pneumonia during this time frame.  On 09/17, she underwent left frontal ventriculostomy after having decreased mental status while CT head demonstrated acute IVH and hydrocephalus after removal of previous ventricular catheter. Around 09/23/2012 she had episode of distal LICA and MCA/ACA vasospasm which was responsive to hypertension and hypervolemia. It was felt that her hypovolemia was related to bleeding after PEG; she was responsive to transfusion, IVFs, Levophed.  On 10/01 CT head showed increasing hydrocephalus with persistent herniation of right frontal and temporal lobes in setting of mild IVH. Due to persistent hydrocephalus, VP shunt was placed on 10/09/2012. Since that time, the patient had been awake, following commands with right body, showed thumbs up and down to simple questions. She was being evaluated for possible CIR.  On 10/13/2012 a nurse reported seizure like activity, followed by decreased mental status. Father stated that the patient projectile vomited, "a lot". CT head was ordered immediately and showed intracranial hemorrhage, intraventricular hemorrhage, hydrocephalus. The patient went back to the OR and underwent placement of a right frontal ventriculostomy shunt.  She is now back in the neurosurgical intensive care unit.  Past Medical  History  Diagnosis Date  . Heart murmur   . Asthma     Past Surgical History  Procedure Laterality Date  . No past surgeries    . Radiology with anesthesia N/A 09/11/2012    Procedure: RADIOLOGY WITH ANESTHESIA-CEREBRAL ANEURYSM COILING;  Surgeon: Medication Radiologist, MD;  Location: [facility] OR;  Service: Radiology;  Laterality: N/A;  . Craniotomy Right 09/11/2012    Procedure: CRANIOTOMY FOR CLIPPING OF  ANEURYSM ;  Surgeon: Lisbeth Renshaw, MD;  Location: [facility] ORS;  Service: Neurosurgery;  Laterality: Right;  . Craniotomy Right 09/12/2012    Procedure: Right decompressive craniectomy and placement of boneflap in right lower quadrant;  Surgeon: Lisbeth Renshaw, MD;  Location: [facility] OR[facility];  Service: Neurosurgery;  Laterality: Right;    History reviewed. No pertinent family history. Social History:  reports that she has been smoking Cigarettes.  She has been smoking about 0.00 packs per day. She does not have any smokeless tobacco history on file. Her alcohol and drug histories are not on file.  Allergies:  Allergies  Allergen Reactions  . Contrast Media [Iodinated Diagnostic Agents] Hives    Current Facility-Administered Medications  Medication Dose Route Frequency Provider Last Rate Last Dose  . 0.9 %  sodium chloride infusion   Intravenous Continuous Nelda Bucks, MD 10 mL/hr at 10/14/12 0700    . acetaminophen (TYLENOL) solution 650 mg  650 mg Per Tube Q6H PRN Lonia Farber, MD      . acetaminophen (TYLENOL) suppository 650 mg  650 mg Rectal Q4H PRN Clydene Fake, MD   650 mg at 10/11/12 0420  . albuterol (PROVENTIL) (5 MG/ML) 0.5% nebulizer solution 2.5 mg  2.5 mg Nebulization Q6H PRN Lisbeth Renshaw, MD   2.5 mg at 09/28/12 1137  .  antiseptic oral rinse (BIOTENE) solution 15 mL  15 mL Mouth Rinse QID Oretha Milch, MD   15 mL at 10/14/12 1212  . artificial tears (LACRILUBE) ophthalmic ointment   Left Eye Q3H PRN Oretha Milch, MD      . ceFAZolin (ANCEF)  IVPB 2 g/50 mL premix  2 g Intravenous Q8H Carmela Hurt, MD   2 g at 10/14/12 0611  . chlorhexidine (PERIDEX) 0.12 % solution 15 mL  15 mL Mouth Rinse BID Oretha Milch, MD   15 mL at 10/14/12 0753  . famotidine (PEPCID) IVPB 20 mg  20 mg Intravenous NOW Lisbeth Renshaw, MD   20 mg at 10/14/12 1331  . feeding supplement (OSMOLITE 1.2 CAL) liquid 1,000 mL  1,000 mL Per Tube Q24H Kalman Shan, MD      . feeding supplement (PRO-STAT SUGAR FREE 64) (PRO-STAT SUGAR FREE 64) liquid 30 mL  30 mL Per Tube Daily Adolph Pollack, MD   30 mL at 10/13/12 1100  . fentaNYL (SUBLIMAZE) injection 25-100 mcg  25-100 mcg Intravenous Q2H PRN Oretha Milch, MD   25 mcg at 09/26/12 0954  . furosemide (LASIX) 10 MG/ML solution 20 mg  20 mg Per Tube Daily Oretha Milch, MD   20 mg at 10/14/12 0943  . levETIRAcetam (KEPPRA) 100 MG/ML solution 500 mg  500 mg Per Tube Q12H Judie Bonus Hammons, RPH   500 mg at 10/14/12 0516  . loperamide (IMODIUM) 1 MG/5ML solution 4 mg  4 mg Per Tube PRN Carmela Hurt, MD      . magic mouthwash  5 mL Oral TID Carmela Hurt, MD   5 mL at 10/14/12 0944  . metoprolol (LOPRESSOR) injection 2.5 mg  2.5 mg Intravenous Q6H PRN Lisbeth Renshaw, MD   2.5 mg at 10/14/12 0641  . ondansetron (ZOFRAN) injection 4 mg  4 mg Intravenous Q6H PRN Clydene Fake, MD   4 mg at 10/13/12 2151  . oxybutynin (DITROPAN) 5 MG/5ML syrup 2.5 mg  2.5 mg Per Tube BID Alyson Reedy, MD   2.5 mg at 10/14/12 0944  . pantoprazole sodium (PROTONIX) 40 mg/20 mL oral suspension 40 mg  40 mg Per Tube Q1200 Merwyn Katos, MD   40 mg at 10/14/12 1212  . polyvinyl alcohol (LIQUIFILM TEARS) 1.4 % ophthalmic solution 1 drop  1 drop Both Eyes PRN Karn Cassis, MD   1 drop at 10/08/12 0308  . simvastatin (ZOCOR) tablet 80 mg  80 mg Oral QHS Lisbeth Renshaw, MD   80 mg at 10/13/12 2238     ROS: unable to obtain due to patients mental status  Physical Examination: Blood pressure 137/74, pulse 105,  temperature 98.7 F (37.1 C), temperature source Oral, resp. rate 20, height 5\' 1"  (1.549 m), weight 51.5 kg (113 lb 8.6 oz), SpO2 95.00%. Awake alert. Afebrile. Head is nontraumatic. Neck is supple without bruit.  . Cardiac exam  soft ejection murmur.no gallop.patient has tracheostomy and peg tube Lungs are clear to auscultation. Distal pulses are well felt. Neurologic Examination: She is awake; however non responsive. Not following any commands Eyes open but do not track, no blink to threat.Pupils equal 4 mm reactive. Fundi not visualized. No response to painful stimuli in any extremity Increased tone in right arm, left arm flaccid Both legs decreased strength, equally, near flaccid left more than right No response to painful stimuli in all 4 limbs.  Results for orders placed during the  hospital encounter of 09/11/12 (from the past 48 hour(s))  GLUCOSE, CAPILLARY     Status: Abnormal   Collection Time    10/12/12  4:53 PM      Result Value Range   Glucose-Capillary 142 (*) 70 - 99 mg/dL   Comment 1 Notify RN    GLUCOSE, CAPILLARY     Status: Abnormal   Collection Time    10/12/12  7:49 PM      Result Value Range   Glucose-Capillary 124 (*) 70 - 99 mg/dL   Comment 1 Notify RN    GLUCOSE, CAPILLARY     Status: Abnormal   Collection Time    10/12/12 11:18 PM      Result Value Range   Glucose-Capillary 110 (*) 70 - 99 mg/dL   Comment 1 Notify RN    CBC     Status: Abnormal   Collection Time    10/13/12  4:24 AM      Result Value Range   WBC 7.1  4.0 - 10.5 K/uL   RBC 3.56 (*) 3.87 - 5.11 MIL/uL   Hemoglobin 10.2 (*) 12.0 - 15.0 g/dL   HCT 40.9 (*) 81.1 - 91.4 %   MCV 83.1  78.0 - 100.0 fL   MCH 28.7  26.0 - 34.0 pg   MCHC 34.5  30.0 - 36.0 g/dL   RDW 78.2 (*) 95.6 - 21.3 %   Platelets 84 (*) 150 - 400 K/uL   Comment: CONSISTENT WITH PREVIOUS RESULT  GLUCOSE, CAPILLARY     Status: Abnormal   Collection Time    10/13/12  4:56 AM      Result Value Range   Glucose-Capillary  141 (*) 70 - 99 mg/dL   Comment 1 Notify RN    GLUCOSE, CAPILLARY     Status: Abnormal   Collection Time    10/13/12  8:06 AM      Result Value Range   Glucose-Capillary 131 (*) 70 - 99 mg/dL   Comment 1 Notify RN     Comment 2 Documented in Chart    GLUCOSE, CAPILLARY     Status: Abnormal   Collection Time    10/13/12  4:04 PM      Result Value Range   Glucose-Capillary 181 (*) 70 - 99 mg/dL   Comment 1 Documented in Chart     Comment 2 Notify RN    GLUCOSE, CAPILLARY     Status: Abnormal   Collection Time    10/13/12  8:52 PM      Result Value Range   Glucose-Capillary 152 (*) 70 - 99 mg/dL  GLUCOSE, CAPILLARY     Status: Abnormal   Collection Time    10/14/12 12:26 AM      Result Value Range   Glucose-Capillary 139 (*) 70 - 99 mg/dL  BASIC METABOLIC PANEL     Status: Abnormal   Collection Time    10/14/12  4:20 AM      Result Value Range   Sodium 136  135 - 145 mEq/L   Potassium 3.9  3.5 - 5.1 mEq/L   Chloride 95 (*) 96 - 112 mEq/L   CO2 28  19 - 32 mEq/L   Glucose, Bld 119 (*) 70 - 99 mg/dL   BUN 17  6 - 23 mg/dL   Creatinine, Ser 0.86 (*) 0.50 - 1.10 mg/dL   Calcium 57.8  8.4 - 46.9 mg/dL   GFR calc non Af Amer >90  >90 mL/min  GFR calc Af Amer >90  >90 mL/min   Comment: (NOTE)     The eGFR has been calculated using the CKD EPI equation.     This calculation has not been validated in all clinical situations.     eGFR's persistently <90 mL/min signify possible Chronic Kidney     Disease.  CBC     Status: Abnormal   Collection Time    10/14/12  4:20 AM      Result Value Range   WBC 17.2 (*) 4.0 - 10.5 K/uL   Comment: WHITE COUNT CONFIRMED ON SMEAR   RBC 3.66 (*) 3.87 - 5.11 MIL/uL   Hemoglobin 10.6 (*) 12.0 - 15.0 g/dL   HCT 16.1 (*) 09.6 - 04.5 %   MCV 81.7  78.0 - 100.0 fL   MCH 29.0  26.0 - 34.0 pg   MCHC 35.5  30.0 - 36.0 g/dL   RDW 40.9 (*) 81.1 - 91.4 %   Platelets 102 (*) 150 - 400 K/uL   Comment: SPECIMEN CHECKED FOR CLOTS     REPEATED TO VERIFY      PLATELET COUNT CONFIRMED BY SMEAR  GLUCOSE, CAPILLARY     Status: Abnormal   Collection Time    10/14/12  4:45 AM      Result Value Range   Glucose-Capillary 119 (*) 70 - 99 mg/dL  GLUCOSE, CAPILLARY     Status: Abnormal   Collection Time    10/14/12  7:46 AM      Result Value Range   Glucose-Capillary 118 (*) 70 - 99 mg/dL  GLUCOSE, CAPILLARY     Status: Abnormal   Collection Time    10/14/12 12:05 PM      Result Value Range   Glucose-Capillary 108 (*) 70 - 99 mg/dL   Ct Head Wo Contrast 78/29/5621    Aneurysmal re-hemorrhage with marked intraventricular blood and increased hydrocephalus. Increased mass effect outward from parenchymal hemorrhage   10/10/2012   Status post left ventriculoperitoneal shunt exchange, the catheter tip now terminates near the foramen Monro. Slightly decreased  moderate hydrocephalus, though there is decreased low density along the catheter tract suggesting improved, decreased back pressure.  Degenerating intraventricular blood products. Status post right decompressive craniectomy with slightly decreased external herniation of cerebrum through the defect, status post apparent aneurysm clipping of right middle cerebral artery bifurcation. Remote middle cerebral artery territory infarct  CT Angio head 09/24/2012 1. Multi focal vasospasm:  *Tandem high-grade distal right ICA stenoses.  *Diffuse high-grade stenosis of the dominant right A1 segment.  *Extensive right M1 vasospasm (with downstream complete MCA  territory infarction).  *Diffuse mild/moderate narrowing of the distal left ICA, left A1  segment, and left M1 segment.  2. Angiographic consideration: Extremely tortuous internal carotid  arteries with focal luminal irregularity at the posterior genu right  cavernous ICA.  3. 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 No  evidence of re-hemorrhage.  4. Stable appearance of ventriculomegaly, intraventricular and  extra-axial  hemorrhage, and increased intracranial pressure.  Dg Abd Portable 1v 10/13/2012   No acute abnormality.      LE DOPPLERS: 09/25/2012 There is no DVT or SVT noted in the bilateral lower extremities  Assessment/Plan: 27 y.o. female with SAH s/p clipping R MCA aneurysm 09/12/12 Patient had episode of projectile vomiting, mental status change and is now with repeat intracerebral hemorrhage with marked intraventricular blood and increased hydrocephalus. Also noted increased mass effect outward from parenchymal hemorrhage. The patient required operative  support on 10/13/2012 for placement of right frontal ventriculostomy.   The etiology of new hemorrhage at previous surgical site precipitated by severe  vomiting.is unclear and represent aneurysm rerupture( less likely given absence of SAH and surgical clipping), versus small vascular malformation previously not seen or bleeding into postop surgical site granulation tissue Recommendations: angiography of brain to identify anatomy source of bleed in setting of increased intracranial pressure (vomiting) with diffuse intraventricular blood and increased hydrocephalus. Dr. Pearlean Brownie discussed plan of care with Dr. Conchita Paris. Maintain strict control of blood pressure with systolic blood pressure goal 120-160 for the first 24 hours followed by 120-180 thereafter. Gwendolyn Lima. Manson Passey, [facility], MBA, MHA [facility] Pager: [phone] 10/14/2012 1:59 PM  This patient is critically ill and at significant risk of neurological worsening, death and care requires constant monitoring of vital signs, hemodynamics,respiratory and cardiac monitoring,review of multiple databases, neurological assessment, discussion with family, other specialists and medical decision making of high complexity. I spent 40 minutes of neurocritical care time  in the care of  this patient. I have personally obtained a history, examined the patient, evaluated imaging results, and formulated the  assessment and plan of care. I agree with the above.  Delia Heady, MD

## 2012-10-14 NOTE — Progress Notes (Signed)
PT Cancellation Note  Patient Details Name: Sally Nelson MRN: [id number] DOB: 04-21-1985   Cancelled Treatment:    Reason Eval/Treat Not Completed: Patient not medically ready. Pt with noted hemorrhage again and going for angiogram. Pt to remain on strict bed rest after procedure. PT to return as able.   Marcene Brawn 10/14/2012, 10:26 AM

## 2012-10-14 NOTE — Progress Notes (Signed)
Chaplain was referred to pt who was in [place] and her father.  Chaplain spent time with the father of pt, praying with him and talking with him for a bit.  Chaplain provided spiritual support and let him know that chaplains are available should he need one.  A follow up is ideal.    10/14/12 1600  Clinical Encounter Type  Visited With Family  Visit Type Follow-up;Spiritual support  Referral From Nurse  Consult/Referral To Chaplain  Spiritual Encounters  Spiritual Needs Prayer  Stress Factors  Patient Stress Factors None identified  Family Stress Factors Loss;Major life changes  CH  Sheral Apley

## 2012-10-14 NOTE — Progress Notes (Signed)
I reviewed the angiogram findings with the patient's father. At this time, she is not a candidate for endovascular embolization of her aneurysm due to its wide-based nature.  In addition, given her ventricular hemorrhage and shunt dependence, she cannot be placed on antiplatelet agents for the use of endovascular stents.  Also, given her continued herniation through the craniectomy defect, I do not believe there is a surgical option for this aneurysm.  Therefore, at this time I told the patient's father that R. Only option is to provide continued support, with blood pressure control, and should she make some type of recovery over the next days to weeks, we may readdress treatment of her aneurysm at that time.  The patient's father understood our discussion and all his questions were answered.  The chaplain was also available for support.

## 2012-10-14 NOTE — Brief Op Note (Signed)
PREOP DX: Intraventricular Hemorrhage  POSTOP DX: Same  PROCEDURE: Diagnostic cerebral angiogram  SURGEON: Dr. Lisbeth Renshaw, MD  ANESTHESIA: IV Sedation with Local  EBL: Minimal  SPECIMENS: None  COMPLICATIONS: None  CONDITION: Stable to neuro-ICU  FINDINGS: 1. Recurrence of previously clipped right M2 aneurysm with displacement of the clip laterally. The aneurysm measures ~4 mm at the neck, 4.61mm at the dome. This appears to be the likely source of recurrent hemorrhage. 2. ~82mm distal right M4 pseudoaneurysm in close proximity to the right frontal ventriculostomy catheter 3. Recanalization of previously occluded M2 branch proximal to the aneurysm.

## 2012-10-14 NOTE — Progress Notes (Signed)
NUTRITION FOLLOW UP  Intervention:   Once able to resume TF continue current TF regimen  1. Osmolite 1.2 @ 60 ml/hr  2. 30 ml Prostat daily  Tube feeding regimen provides 1828 kcal, 95 grams of protein, and 1167 ml of H2O.   Nutrition Dx:   Inadequate oral intake related to inability to eat as evidenced by NPO status; ongoing.    Goal:  Intake to meet >90% of estimated nutrition needs, met.   Monitor:  TF initiation/tolerance/adequacy, weight trend, labs, vent status  Assessment:   Pt admitted with a subarachnoid hemorrhage. 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 This was managed with a ventriculostomy and eventual placement of a ventriculoperitoneal shunt. The patient had some seizure activity and decreased mental status 10/13. A stat head CT scan demonstrated intraventricular hemorrhage and hydrocephalus. Pt had a right frontal ventriculostomy placed 10/13.  Trach and PEG 9/23. Pt now on trach collar.  Pt started on scheduled imodium 9/29.  Pt's tube feeding changed to Osmolite 1.2 on 10/2 due to persistent diarrhea. Diarrhea now resolved. Flexiseal removed 10/5. Pt discussed during ICU rounds and with RN.  Last BM documented 10/6. Pt no longer on scheduled imodium. Pt has been off the unit since 10/10 so unsure if any BM's have gone undocumented. Notified RN who will monitor for BM. Per RN pt is less responsive and only moving right upper extremity.    Height: Ht Readings from Last 1 Encounters:  09/11/12 5\' 1"  (1.549 m)    Weight Status:   Wt Readings from Last 1 Encounters:  10/14/12 113 lb 8.6 oz (51.5 kg)  Admission weight: 118 lb 9/11  Re-estimated needs:  Kcal: 1700-1900 Protein: 80-100 grams  Fluid: >1.5 L/day  Skin: head and abd incision; stage II pressure ulcer dated 9/27  Diet Order:   NPO    Intake/Output Summary  (Last 24 hours) at 10/14/12 0840 Last data filed at 10/14/12 0800  Gross per 24 hour  Intake    580 ml  Output    123 ml  Net    457 ml    Last BM: 10/6  Labs:   Recent Labs Lab 10/10/12 0550 10/11/12 0430 10/14/12 0420  NA 139 140 136  K 4.3 4.1 3.9  CL 99 101 95*  CO2 30 29 28   BUN 14 16 17   CREATININE 0.49* 0.34* 0.29*  CALCIUM 9.5 9.3 10.4  GLUCOSE 91 124* 119*    CBG (last 3)   Recent Labs  10/14/12 0026 10/14/12 0445 10/14/12 0746  GLUCAP 139* 119* 118*    Scheduled Meds: . antiseptic oral rinse  15 mL Mouth Rinse QID  .  ceFAZolin (ANCEF) IV  2 g Intravenous Q8H  . chlorhexidine  15 mL Mouth Rinse BID  . feeding supplement (OSMOLITE 1.2 CAL)  1,000 mL Per Tube Q24H  . feeding supplement (PRO-STAT SUGAR FREE 64)  30 mL Per Tube Daily  . furosemide  20 mg Per Tube Daily  . levETIRAcetam  500 mg Per Tube Q12H  . magic mouthwash  5 mL Oral TID  . oxybutynin  2.5 mg Per Tube BID  . pantoprazole sodium  40 mg Per Tube Q1200  . simvastatin  80 mg Oral QHS    Continuous Infusions: . sodium chloride 10 mL/hr at 10/14/12 0700    Kendell Bane RD, LDN, CNSC [phone] Pager [phone] After Hours  Pager

## 2012-10-14 NOTE — Clinical Social Work Note (Signed)
Clinical Social Work Department BRIEF PSYCHOSOCIAL ASSESSMENT 10/14/2012  Patient:  Sally Nelson, Sally Nelson     Account Number:  [id number]     Admit date:  09/11/2012  Clinical Social Worker:  Verl Blalock  Date/Time:  10/14/2012 12:00 N  Referred by:  RN  Date Referred:  10/14/2012 Referred for  Psychosocial assessment  Other - See comment   Other Referral:   Patient father in need of food vouchers   Interview type:  Family Other interview type:   Patient father at bedside    PSYCHOSOCIAL DATA Living Status:  PARENTS Admitted from facility:   Level of care:   Primary support name:  Vernel Chrisp Primary support relationship to patient:  PARENT Degree of support available:   Strong    CURRENT CONCERNS Current Concerns  Adjustment to Illness  Post-Acute Placement   Other Concerns:    SOCIAL WORK ASSESSMENT / PLAN Clinical Social Worker met with patient father at bedside to offer continued support.  Patient father states that he and patient have been living together in [address] housing for about a year now.  Patient draws Disability and patient father has been working towards collecting Disability. Patient is no longer following commands or tracking with her eyes.  Patient father states, "she is the only one I have left."  Patient father goes on to say, that patient brother was killed in a motor vehicle crash in August 2013 and patient mother was hit by a car and killed in October 2013.  Patient and patient father have been taking care of one another since their tragic loss.  Patient father plans to stay with patient for the duration of her hospitalization.  Patient father is not willing to further discuss discharge options at this time, due to patient recent set back.    Clinical Child psychotherapist spoke with supervisor [employer] [place]) and Pastoral care Theda Belfast) in regards to patient father basic needs being met.  CSW was able to receive authorization for meal vouchers daily and  Pastoral Care was able to work with cafeteria services to get a guest lunch tray daily.  Patient father is beyond grateful for all things given to him at this time.  Patient father is very emotional.  CSW remains available for support to patient and patient father.   Assessment/plan status:  Psychosocial Support/Ongoing Assessment of Needs Other assessment/ plan:   Information/referral to community resources:   Visual merchandiser provided patient father with meal vouchers for the remainder of the week.    PATIENT'S/FAMILY'S RESPONSE TO PLAN OF CARE: Patient currently obtunded, which is a change from yesterday's neuro status.  Patient has been transferred back to the Neuro/Trauma ICU.  Patient father seems to understand the severity of patient current situation and is praying for a miracle.  Patient father is very emotional and continues to state that he feels so blessed by the hospital staff.  Patient father states, "I will do whatever it takes to care for her."  Patient father continues to verbalize his ongoing appreciation for CSW support.

## 2012-10-15 LAB — GLUCOSE, CAPILLARY
Glucose-Capillary: 131 mg/dL — ABNORMAL HIGH (ref 70–99)
Glucose-Capillary: 133 mg/dL — ABNORMAL HIGH (ref 70–99)
Glucose-Capillary: 142 mg/dL — ABNORMAL HIGH (ref 70–99)

## 2012-10-15 LAB — CBC
HCT: 30.5 % — ABNORMAL LOW (ref 36.0–46.0)
Hemoglobin: 10.8 g/dL — ABNORMAL LOW (ref 12.0–15.0)
MCH: 29.1 pg (ref 26.0–34.0)
MCHC: 35.4 g/dL (ref 30.0–36.0)
Platelets: 93 10*3/uL — ABNORMAL LOW (ref 150–400)
RBC: 3.71 MIL/uL — ABNORMAL LOW (ref 3.87–5.11)
RDW: 17.2 % — ABNORMAL HIGH (ref 11.5–15.5)
WBC: 19.6 10*3/uL — ABNORMAL HIGH (ref 4.0–10.5)

## 2012-10-15 MED ORDER — METOPROLOL TARTRATE 1 MG/ML IV SOLN
5.0000 mg | INTRAVENOUS | Status: DC | PRN
  Administered 2012-10-15 – 2012-10-17 (×5): 5 mg via INTRAVENOUS
  Administered 2012-10-17: 2.5 mg via INTRAVENOUS
  Administered 2012-10-17 – 2012-10-25 (×16): 5 mg via INTRAVENOUS
  Filled 2012-10-15 (×24): qty 5

## 2012-10-15 MED ORDER — OSMOLITE 1.2 CAL PO LIQD
1000.0000 mL | ORAL | Status: DC
  Administered 2012-10-15 – 2012-10-26 (×15): 1000 mL
  Filled 2012-10-15 (×16): qty 1000

## 2012-10-15 MED ORDER — OSMOLITE 1.2 CAL PO LIQD
1000.0000 mL | ORAL | Status: DC
  Filled 2012-10-15 (×2): qty 1000

## 2012-10-15 NOTE — Progress Notes (Signed)
Rehab admissions - Patient not able to participate fully or tolerate acute inpatient rehab therapies at this time.  I am going to sign off for acute inpatient rehab here at [facility].  Call me for questions.  #[phone]

## 2012-10-15 NOTE — Progress Notes (Signed)
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 PCCM continuing to follow for post op vent management.  SIGNIFICANT EVENTS / STUDIES:  9/12 OR: Craniotomy, clipping R MCA aneurysm for a [facility] 9/12 Head CT: Interval development R SDH, effacement basilar cisterns, evolving R MCA CVA, midline shift 14mm 9/15 Echocardiogram: LVEF 60-65%. Grade 2 diastolic dysfunction. Mildly thickened AoV. Moderately thickened MV leaflets with moderate to severe MR. Moderate pulmonic stenosis 9/23 PEG Tube, tracheostomy placement 10/1 CT head: increasing hydrocephalus, persistent herniation of right frontal and temporal lobes, mild intraventricular hemorrhage 10/6 scheduled for VP shunt placement Thursday. Continues to have ventriculostomy drainage. Trach in place 10/8 VPS shunt scheduled for tomorrow 10/9 VPS shunt scheduled. Pyuria  > treated as UTI 10/10 VP shunt placed 10/13 N/V. Seizure activity. STAT CT head: Aneurysmal re-hemorrhage with marked intraventricular blood and increased hydrocephalus. Increased mass effect outward from parenchymal hemorrhage. 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 The aneurysm measures ~4 mm at the neck, 4.64mm at the dome. This appears to be the likely source of recurrent hemorrhage. Approx 5mm distal right M4 pseudoaneurysm in close proximity to the right frontal ventriculostomy catheter Recanalization of previously occluded M2 branch proximal to the aneurysm.   LINES / TUBES: ETT 9/12 >>9/23  L [place]  9/12 >>>10/9 Ventriculostomy 9/17  >>>10/9 G tube 9/23 >>  Tracheostomy 9/23 >> VP shunt 10/9 >>  R frontal ventric drain 10/13 >>   CULTURES: 9/11 MRSA>>> neg 9/12 Respirstory >>> gram neg cocci, non-pathogenic flora 9/22 Urine >>> E. Coli sensitive to Zosyn 9/22 CSF >>> neg 9/22 Respiratory >>>neg 10/8 Urine>>> E. Coli   ANTIBIOTICS: Unasyn 9/12 >>> 9/20 Vancomycin 9/22 >>> 9/29 Zosyn 9/22 >>> 9/29 Cipro 10/10>>>10/11 Cefazolin 10/11 >>   SUBJECTIVE:   Staring blankly. Not F/C. NAD  VITAL SIGNS: Temp:  [98 F (36.7 C)-99.3 F (37.4 C)] 98.7 F (37.1 C) (10/15 0737) Pulse Rate:  [81-134] 104 (10/15 0900) Resp:  [13-37] 19 (10/15 0900) BP: (117-167)/(53-102) 141/86 mmHg (10/15 0900) SpO2:  [91 %-98 %] 98 % (10/15 0900) FiO2 (%):  [28 %-50 %] 28 % (10/15 0737) Weight:  [51.4 kg (113 lb 5.1 oz)] 51.4 kg (113 lb 5.1 oz) (10/15 0500)  VENTILATOR SETTINGS: Vent Mode:  [-]  FiO2 (%):  [28 %-50 %] 28 %  INTAKE / OUTPUT: Intake/Output     10/14 0701 - 10/15 0700 10/15 0701 - 10/16 0700   I.V. (mL/kg) 60 (1.2)    Other     NG/GT 260 40   IV Piggyback 150    Total Intake(mL/kg) 470 (9.1) 40 (0.8)   Drains 159 5   Total Output 159 5   Net +311 +35        Urine Occurrence 3 x     PHYSICAL EXAMINATION: Gen: wd female, nad Neuro: not responsive, minimal spont movement. HEENT: NAD. ventric in place Neck: trach site clean PULM: clear anteriorly CV: Regular, 3/6 systolic M AB: Soft abdomen, + BS, G tube site clean Ext: No edema, warm   BMET    Component Value Date/Time   NA 136  10/14/2012 0420   K 3.9 10/14/2012 0420   CL 95* 10/14/2012 0420   CO2 28 10/14/2012 0420   GLUCOSE 119* 10/14/2012 0420   BUN 17 10/14/2012 0420   CREATININE 0.29* 10/14/2012 0420   CALCIUM 10.4 10/14/2012 0420   GFRNONAA >90 10/14/2012 0420   GFRAA >90 10/14/2012 0420    CBC    Component Value Date/Time   WBC 17.2* 10/14/2012 0420   RBC 3.66* 10/14/2012 0420   HGB 10.6* 10/14/2012 0420   HCT 29.9* 10/14/2012  0420   PLT 102* 10/14/2012 0420   MCV 81.7 10/14/2012 0420   MCH 29.0 10/14/2012 0420   MCHC 35.5 10/14/2012 0420   RDW 17.3* 10/14/2012 0420   LYMPHSABS 3.3 09/25/2012 0400   MONOABS 0.8 09/25/2012 0400   EOSABS 0.0 09/25/2012 0400   BASOSABS 0.0 09/25/2012 0400      Recent Labs Lab 10/14/12 1545 10/14/12 1955 10/14/12 2353 10/15/12 0405 10/15/12 0753  GLUCAP 102* 140* 142* 131* 133*   CXR: NNF  ASSESSMENT / PLAN:  PULMONARY A:  Post op respiratory failure Trach tube dependence H/O Asthma, no active wheezing P:   Cont ATC as tolerated Cont albuterol PRN  CARDIOVASCULAR A: 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   P:  Monitor CBC intermittently  INFECTIOUS A:  Suspected aspiration pneumonia, resolved   UTI,  pansens E coli P:   Micro and abx as above Will cont Cefazolin for now while ventric drain in place  ENDOCRINE A:   Steroid induced hyperglycemia, resolved.   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  After 5:30 PM or weekends, call [phone]

## 2012-10-15 NOTE — Progress Notes (Signed)
Stroke Team Progress Note  HISTORY Sally Nelson is an 27 y.o. 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 Patient was taken for emergent craniectomy for decompression in setting of intracranial hypertension. Treatments included concentrated saline, trach, PEG. She was kept on anticonvulsants, vasospasm prophylaxis and TCDs q MWF. Treated for pneumonia during this time frame.   On 09/17, she underwent left frontal ventriculostomy after having decreased mental status while CT head demonstrated acute IVH and hydrocephalus after removal of previous ventricular catheter. Around 09/23/2012 she had episode of distal LICA and MCA/ACA vasospasm which was responsive to hypertension and hypervolemia. It was felt that her hypovolemia was related to bleeding after PEG; she was responsive to transfusion, IVFs, Levophed.   On 10/01 CT head showed increasing hydrocephalus with persistent herniation of right frontal and temporal lobes in setting of mild IVH. Due to persistent hydrocephalus, VP shunt was placed on 10/09/2012. Since that time, the patient had been awake, following commands with right body, showed thumbs up and down to simple questions. She was being evaluated for possible CIR.   On 10/13/2012 a nurse reported seizure like activity, followed by decreased mental status. Father stated that the patient projectile vomited, "a lot". CT head was ordered immediately and showed intracranial hemorrhage, intraventricular hemorrhage, hydrocephalus. The patient went back to the OR and underwent placement of a right frontal ventriculostomy shunt.  Angiogram reveals R MCA aneurysm reoccurence and new M4 pseudoaneurysm   SUBJECTIVE Her father is at the bedside. Minimal  spontaneous movement. No communication. Eyes open.   OBJECTIVE Most recent Vital Signs: Filed Vitals:   10/15/12 0400 10/15/12 0500 10/15/12 0600 10/15/12 0737  BP: 124/62 141/71 130/75 133/81  Pulse: 98 111 87 99  Temp: 99.3 F (37.4 C)     TempSrc: Oral     Resp: 22 22 20 23   Height:      Weight:  51.4 kg (113 lb 5.1 oz)    SpO2: 95%  95% 97%   CBG (last 3)   Recent Labs  10/14/12 1955 10/14/12 2353 10/15/12 0405  GLUCAP 140* 142* 131*    IV Fluid Intake:   . sodium chloride 10 mL/hr at 10/14/12 0700    MEDICATIONS  . antiseptic oral rinse  15 mL Mouth Rinse QID  .  ceFAZolin (ANCEF) IV  2 g Intravenous Q8H  . chlorhexidine  15 mL Mouth Rinse BID  . feeding supplement (OSMOLITE 1.2 CAL)  1,000 mL Per Tube Q24H  . feeding supplement (PRO-STAT SUGAR FREE 64)  30 mL Per Tube Daily  . furosemide  20 mg Per Tube Daily  . levETIRAcetam  500 mg Per Tube Q12H  . magic mouthwash  5 mL Oral TID  . oxybutynin  2.5 mg Per Tube BID  . pantoprazole sodium  40 mg Per Tube Q1200  . simvastatin  80 mg Oral QHS   PRN:  acetaminophen (TYLENOL) oral liquid 160 mg/5 mL, acetaminophen, albuterol, artificial tears, fentaNYL, labetalol, loperamide, metoprolol, ondansetron (ZOFRAN) IV, polyvinyl alcohol  Diet:    Feeding liquids Activity:  Bedrest  DVT Prophylaxis:  SCD  CLINICALLY SIGNIFICANT STUDIES Basic Metabolic Panel:  Recent Labs Lab 10/11/12 0430 10/14/12 0420  NA 140 136  K 4.1 3.9  CL 101 95*  CO2 29 28  GLUCOSE 124* 119*  BUN 16 17  CREATININE 0.34* 0.29*  CALCIUM 9.3 10.4   Liver Function Tests:  Recent Labs Lab 10/09/12 1004  AST 31  ALT 43*  ALKPHOS 94  BILITOT 0.5  PROT 7.7  ALBUMIN 3.2*   CBC:  Recent Labs Lab 10/13/12 0424 10/14/12 0420  WBC 7.1 17.2*  HGB 10.2* 10.6*  HCT 29.6* 29.9*  MCV 83.1 81.7  PLT 84* 102*   Coagulation:  Recent Labs Lab 10/09/12 0515  LABPROT 12.9  INR 0.99   Cardiac Enzymes: No results found for this  basename: CKTOTAL, CKMB, CKMBINDEX, TROPONINI,  in the last 168 hours Urinalysis:  Recent Labs Lab 10/08/12 1135  COLORURINE YELLOW  LABSPEC 1.020  PHURINE 8.0  GLUCOSEU NEGATIVE  HGBUR LARGE*  BILIRUBINUR NEGATIVE  KETONESUR NEGATIVE  PROTEINUR NEGATIVE  UROBILINOGEN 4.0*  NITRITE POSITIVE*  LEUKOCYTESUR LARGE*   Lipid Panel    Component Value Date/Time   TRIG 111 09/15/2012 0430   HgbA1C  No results found for this basename: HGBA1C    Urine Drug Screen:   No results found for this basename: labopia, cocainscrnur, labbenz, amphetmu, thcu, labbarb    Alcohol Level: No results found for this basename: ETH,  in the last 168 hours  Ct Head Wo Contrast 10/13/2012    Aneurysmal re-hemorrhage with marked intraventricular blood and increased hydrocephalus. Increased mass effect outward from parenchymal hemorrhage.   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 The aneurysm measures ~4 mm at the neck,4.59mm at the dome. This appears to be the likely source of recurrent hemorrhage. 2. ~21mm distal right M4 pseudoaneurysm in close proximity to the right frontal ventriculostomy catheter 3. Recanalization of previously occluded M2 branch proximal to the aneurysm.   Dg Abd Portable 1v 10/13/2012  No acute abnormality.    CTA of the brain   09/26/2012   IMPRESSION:  1. Multi focal vasospasm:  *Tandem high-grade distal right ICA stenoses.  *Diffuse high-grade stenosis of the dominant right A1 segment.  *Extensive right M1 vasospasm (with downstream complete MCA  territory infarction).  *Diffuse mild/moderate narrowing of the distal left ICA, left A1  segment, and left M1 segment.  2. Angiographic consideration: Extremely tortuous internal carotid  arteries with focal luminal irregularity at the posterior genu right  cavernous ICA.  3. 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  No  evidence of re-hemorrhage.  4. Stable appearance of ventriculomegaly, intraventricular and  extra-axial hemorrhage, and increased intracranial pressure.   MRI of the brain    MRA of the brain    2D Echocardiogram EF 60%. No regional wall motion abnormalities, No cardiac source of embolism was identified.   Carotid Doppler    CXR    Therapy Recommendations OT recommending LTACH  Physical Exam   Blood pressure 137/74, pulse 105, temperature 98.7 F (37.1 C), temperature source Oral, resp. rate 20, height 5\' 1"  (1.549 m), weight 51.5 kg (113 lb 8.6 oz), SpO2 95.00%.  Awake alert. Afebrile. Head is nontraumatic. Neck is supple without bruit. . Cardiac exam soft ejection murmur.no gallop.patient has tracheostomy and peg tube Lungs are clear to auscultation. Distal pulses are well felt.  Neurologic Examination:  She is awake; however non responsive. Not following any commands  Eyes open but do not track, no blink to threat.Pupils equal 4 mm reactive. Fundi not visualized.  No response to painful stimuli in any extremity  Increased tone in right arm and will occasionally move it, left arm flaccid  Both legs decreased strength, equally, near flaccid left more than right  No response to painful stimuli in all 4 limbs.     ASSESSMENT Sally Nelson is a 27 y.o. female presenting with decreased responsiveness on 09/11/2012 and diagnosed with SAH. Prolonged hospital stay requiring clipping of R MCA aneurysm, craniotomy, VP shunt, and with an episode of vomiting yesterday, she developed ICH, IVH, hydrocephalus which required a right frontal ventriculostomy shunt. Cause of the most recent ICH  is due to aneurysm recurrence and rupture induced by severe vomiting  On no antithrombotics prior to admission. Now on no antithrombotics for secondary stroke prevention. Patient otherwise non-responsive with exception of increased tone right arm. She is awake, but nonresponsive.   Hypertension  history  Post-operative respiratory failure, trach dependent  Protein-calorie malnutrition, PEG with feeds   Hospital day # 34  TREATMENT/PLAN  Continue no antithrombotics due to aneurysmal bleed for secondary stroke prevention.  Supportive Care at present with no surgical intervention, re-evaluate over time  Maintain strict control of blood pressure with systolic blood pressure goal 120-160 for the first 24 hours followed by 120-180 thereafter.   Dr. Pearlean Brownie discussed the plan of care with Dr. Conchita Paris and the patients father.  Gwendolyn Lima. Manson Passey, PAC, MBA, MHA [facility] Pager: [phone] 10/15/2012 2:15 PM This patient is critically ill and at significant risk of neurological worsening, death and care requires constant monitoring of vital signs, hemodynamics,respiratory and cardiac monitoring,review of multiple databases, neurological assessment, discussion with family, other specialists and medical decision making of high complexity. I spent 30 minutes of neurocritical care time  in the care of  this patient. I have personally obtained a history, examined the patient, evaluated imaging results, and formulated the assessment and plan of care. I agree with the above. Delia Heady, MD

## 2012-10-15 NOTE — Progress Notes (Signed)
Pt seen and examined. Pt underwent diagnostic angiogram last pm, and per RN, had significant bleeding from right frontal EVD site last night.   EXAM: Temp:  [98 F (36.7 C)-99.3 F (37.4 C)] 98.7 F (37.1 C) (10/15 0737) Pulse Rate:  [81-134] 81 (10/15 0800) Resp:  [13-37] 19 (10/15 0800) BP: (117-167)/(53-102) 134/85 mmHg (10/15 0800) SpO2:  [91 %-98 %] 98 % (10/15 0800) FiO2 (%):  [28 %-50 %] 28 % (10/15 0737) Weight:  [51.4 kg (113 lb 5.1 oz)] 51.4 kg (113 lb 5.1 oz) (10/15 0500) Intake/Output     10/14 0701 - 10/15 0700 10/15 0701 - 10/16 0700   I.V. (mL/kg) 60 (1.2)    Other     NG/GT 260 20   IV Piggyback 150    Total Intake(mL/kg) 470 (9.1) 20 (0.4)   Drains 159 5   Total Output 159 5   Net +311 +15        Urine Occurrence 3 x     EVD in place, dressing is bloody, open at 10 - 159cc x 24hrs Eyes open, not tracking Trached, on collar Moves RUE spontaneously, not FC. No significant movement of RLE, LUE/LLE  LABS: Lab Results  Component Value Date   CREATININE 0.29* 10/14/2012   BUN 17 10/14/2012   NA 136 10/14/2012   K 3.9 10/14/2012   CL 95* 10/14/2012   CO2 28 10/14/2012   Lab Results  Component Value Date   WBC 17.2* 10/14/2012   HGB 10.6* 10/14/2012   HCT 29.9* 10/14/2012   MCV 81.7 10/14/2012   PLT 102* 10/14/2012    IMAGING: Angiogram demonstrates right MCA aneurysm recurrence and new M4 cortical segment 5mm pseudoaneurysm in proximity to the ventricular catheter.  IMPRESSION: - 27 y.o. female SAH d# 60 with new IVH likely secondary to aneurysm recurrence. - Remains significantly obtunded - EVD site bleeding likely related to underlying pseudoaneurysm - No good treatment options for the RMCA aneurysm recurrence at this time  PLAN: - Cont supportive mgmt, SBP goal < - Cont EVD drainage at 10 - Check CBC

## 2012-10-15 NOTE — Progress Notes (Signed)
Dr. Conchita Paris notified of heart rate >150.  New orders received. Will continue to monitor closely.

## 2012-10-15 NOTE — Progress Notes (Signed)
Physical Therapy Treatment Patient Details Name: Sally Nelson MRN: [id number] DOB: [birth year] Today's Date: 10/15/2012 Time: 4098-1191 PT Time Calculation (min): 36 min  PT Assessment / Plan / Recommendation  History of Present Illness Pt with new aneurysm found on monday 10/13.   PT Comments   Pt with decline in functional status since new IVH on Monday. Questionable if pt would be able to tolerate CIR at this point may need LTACH or SNF. Acute PT to con't to assess functional ability and cognition.   Follow Up Recommendations  LTACH;SNF     Does the patient have the potential to tolerate intense rehabilitation     Barriers to Discharge        Equipment Recommendations       Recommendations for Other Services    Frequency Min 3X/week   Progress towards PT Goals Progress towards PT goals: Not progressing toward goals - comment  Plan Discharge plan needs to be updated    Precautions / Restrictions Precautions Precautions: Fall Precaution Comments: no bone flap on R side, ventric Restrictions Weight Bearing Restrictions: No   Pertinent Vitals/Pain Unable to communicate    Mobility  Bed Mobility Bed Mobility: Rolling Right;Rolling Left;Supine to Sit;Sit to Supine Rolling Right: 1: +2 Total assist Rolling Right: Patient Percentage: 0% Rolling Left: 1: +2 Total assist Rolling Left: Patient Percentage: 0% Left Sidelying to Sit: 1: +2 Total assist Left Sidelying to Sit: Patient Percentage: 0% Supine to Sit: 1: +2 Total assist;HOB flat Supine to Sit: Patient Percentage: 0% Sitting - Scoot to Edge of Bed: 1: +2 Total assist Sitting - Scoot to Edge of Bed: Patient Percentage: 0% Sit to Supine: 1: +2 Total assist;HOB flat Sit to Supine: Patient Percentage: 0% Details for Bed Mobility Assistance: assist for head control, LE mangement and trunk elevation. pt with no initiation this date Transfers Transfers: Not assessed    Exercises     PT Diagnosis:    PT Problem  List:   PT Treatment Interventions:     PT Goals (current goals can now be found in the care plan section) Acute Rehab PT Goals Patient Stated Goal: unable to state  Visit Information  Last PT Received On: 10/15/12 Assistance Needed: +2 History of Present Illness: Pt with new aneurysm found on monday 10/13.    Subjective Data  Patient Stated Goal: unable to state   Cognition  Cognition Arousal/Alertness: Awake/alert Behavior During Therapy: Flat affect Overall Cognitive Status: Impaired/Different from baseline Area of Impairment: Attention;Following commands;Problem solving;Awareness Current Attention Level: Focused Following Commands: Follows one step commands with increased time;Follows one step commands inconsistently Awareness: Intellectual Problem Solving: Slow processing;Decreased initiation;Difficulty sequencing;Requires verbal cues;Requires tactile cues General Comments: pt with delayed processing compared to monday. pt with con't difficulty with eye tracking    Balance  Static Sitting Balance Static Sitting - Balance Support: Feet supported Static Sitting - Level of Assistance: 1: +2 Total assist Static Sitting - Comment/# of Minutes: 1 person for head and 1 person for trunk due to pt with ventric which pt was bleeding from. RN present during tx. Sat EOB x 10 min. Pt did attempted to move R UE to command however unable to move bilat LEs. Pt with no ability to attempt to maintain uprigh position or cervical extension requiring dependency to maintain sitting EOB balance. Pt did use R UE purposefully however required significant increase in time.  End of Session PT - End of Session Equipment Utilized During Treatment: Oxygen Activity Tolerance: Patient tolerated treatment well  Patient left: in bed;with call bell/phone within reach;with nursing/sitter in room Nurse Communication: Mobility status   GP     Marcene Brawn 10/15/2012, 9:33 AM  Lewis Shock, PT,  DPT Pager #: [phone] Office #: [phone]

## 2012-10-15 NOTE — Progress Notes (Signed)
SLP Cancellation Note  Patient Details Name: Sally Nelson MRN: [id number] DOB: 26-Aug-1985   Cancelled treatment:       Reason Eval/Treat Not Completed: Medical issues which prohibited therapy. Will follow for readiness for continued PMSV trials.    Itzel Mckibbin, Riley Nearing 10/15/2012, 7:43 AM

## 2012-10-15 NOTE — Progress Notes (Signed)
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   OT comments  Pt has declined in functional status since last OT session on Monday 10/13. Updated d/c recommendation to [facility] since it is questionable that pt could tolerate CIR. Will continue to follow acutely.  Follow Up Recommendations  LTACH    Barriers to Discharge       Equipment Recommendations  3 in 1 bedside comode;Tub/shower bench;Wheelchair (measurements OT);Wheelchair cushion (measurements OT);Hospital bed    Recommendations for Other Services    Frequency Min 3X/week   Progress towards OT Goals Progress towards OT goals: Not progressing toward goals - comment  Plan Discharge plan needs to be updated    Precautions / Restrictions Precautions Precautions: Fall Precaution Comments: no bone flap on R side, ventric Restrictions Weight Bearing Restrictions: No   Pertinent Vitals/Pain See vitals; pt unable to communicate     ADL  Eating/Feeding: NPO ADL Comments: Focus of session on trunk control while sitting EOB. Pt able to initate movement in RUE when requested (when asked to give thumbs up, pt initiated movement in right wrist/hand but unable to complete task).  Incr time for processing of all commands. Right gaze preference throughout session but able to visually focus on phone at midline to look at pictures.    OT Diagnosis:    OT Problem List:   OT Treatment Interventions:     OT Goals(current goals can now be found in the care plan section) Acute Rehab OT Goals Patient Stated Goal: unable to state OT Goal Formulation: With family Time For Goal Achievement: 10/27/12 Potential to Achieve Goals: Good ADL Goals Pt Will Perform Grooming: with mod assist;sitting Additional  ADL Goal #1: Pt will maintain sustained attention to task x 2 min with eyes at midline Additional ADL Goal #2: pt will maintain head control in supported sitting with mod A Additional ADL Goal #3: Demonstrate visual scanning to locate target in L visual  field with mod cuing. 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    Subjective Data      Prior Functioning       Cognition  Cognition Arousal/Alertness: Awake/alert Behavior During Therapy: Flat affect Overall Cognitive Status: Impaired/Different from baseline Area of Impairment: Attention;Following commands;Problem solving;Awareness Current Attention Level: Focused Following Commands: Follows one step commands with increased time;Follows one step commands inconsistently Awareness: Intellectual Problem Solving: Slow processing;Decreased initiation;Difficulty sequencing;Requires verbal cues;Requires tactile cues General Comments: pt with delayed processing compared to monday. pt with con't difficulty with eye tracking    Mobility  Bed Mobility Bed Mobility: Rolling Right;Rolling Left;Supine to Sit;Sit to Supine Rolling Right: 1: +2 Total assist Rolling Right: Patient Percentage: 0% Rolling Left: 1: +2 Total assist Rolling Left: Patient Percentage: 0% Left Sidelying to Sit: 1: +2 Total assist Left Sidelying to Sit: Patient Percentage: 0% Supine to Sit: 1: +2 Total assist;HOB flat Supine to Sit: Patient Percentage: 0% Sitting - Scoot to Edge of Bed: 1: +2 Total assist Sitting - Scoot to Edge of Bed: Patient Percentage: 0% Sit to Supine: 1: +2 Total assist;HOB flat Sit to Supine: Patient Percentage: 0% Details for Bed Mobility Assistance:  assist for head control, LE mangement and trunk elevation. pt with no initiation this date    Exercises  General Exercises -  Upper Extremity Shoulder Flexion: PROM;Both;10 reps;Supine Shoulder ABduction: PROM;Both;10 reps;Supine Shoulder ADduction: PROM;10 reps;Both;Supine Elbow Flexion: PROM;Both;10 reps;Supine Elbow Extension: Supine;PROM;Both;10 reps   Balance Balance Balance Assessed: Yes Static Sitting Balance Static Sitting - Balance Support: Feet supported Static Sitting - Level of Assistance: 1: +2 Total assist Static Sitting - Comment/# of Minutes: 1 person behind pt for truncal support and 1 person in front for head control.  RN present during session. Pt sat EOB x10 min. Pt initiated purposeful movement in RUE but no active movement noted in LUE while sitting EOB. No trunk activation noted while sitting EOB.    End of Session OT - End of Session Equipment Utilized During Treatment: Oxygen Activity Tolerance: Patient tolerated treatment well Patient left: in bed;with call bell/phone within reach;with nursing/sitter in room Nurse Communication:  (pt bleeding from ventric site)  GO    10/15/2012 Cipriano Mile OTR/L Pager [phone] Office [phone]  Cipriano Mile 10/15/2012, 12:02 PM

## 2012-10-16 DIAGNOSIS — J690 Pneumonitis due to inhalation of food and vomit: Secondary | ICD-10-CM

## 2012-10-16 DIAGNOSIS — I609 Nontraumatic subarachnoid hemorrhage, unspecified: Secondary | ICD-10-CM

## 2012-10-16 DIAGNOSIS — J95821 Acute postprocedural respiratory failure: Secondary | ICD-10-CM

## 2012-10-16 LAB — URINALYSIS, ROUTINE W REFLEX MICROSCOPIC
Glucose, UA: NEGATIVE mg/dL
Ketones, ur: 15 mg/dL — AB
Nitrite: NEGATIVE
Specific Gravity, Urine: 1.035 — ABNORMAL HIGH (ref 1.005–1.030)
pH: 5 (ref 5.0–8.0)

## 2012-10-16 LAB — URINE MICROSCOPIC-ADD ON

## 2012-10-16 LAB — GLUCOSE, CAPILLARY
Glucose-Capillary: 129 mg/dL — ABNORMAL HIGH (ref 70–99)
Glucose-Capillary: 118 mg/dL — ABNORMAL HIGH (ref 70–99)

## 2012-10-16 MED ORDER — SIMVASTATIN 5 MG PO TABS
5.0000 mg | ORAL_TABLET | Freq: Every day | ORAL | Status: DC
  Administered 2012-10-16 – 2012-10-26 (×11): 5 mg via ORAL
  Filled 2012-10-16 (×13): qty 1

## 2012-10-16 MED ORDER — SODIUM CHLORIDE 0.9 % IV BOLUS (SEPSIS)
500.0000 mL | Freq: Once | INTRAVENOUS | Status: AC
  Administered 2012-10-16: 500 mL via INTRAVENOUS

## 2012-10-16 NOTE — Progress Notes (Signed)
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 PCCM continuing to follow for post op vent management.  SIGNIFICANT EVENTS / STUDIES:  9/12 OR: Craniotomy, clipping R MCA aneurysm for a [facility] 9/12 Head CT: Interval development R SDH, effacement basilar cisterns, evolving R MCA CVA, midline shift 14mm 9/15 Echocardiogram: LVEF 60-65%. Grade 2 diastolic dysfunction. Mildly thickened AoV. Moderately thickened MV leaflets with moderate to severe MR. Moderate pulmonic stenosis 9/23 PEG Tube, tracheostomy placement 10/1 CT head: increasing hydrocephalus, persistent herniation of right frontal and temporal lobes, mild intraventricular hemorrhage 10/6 scheduled for VP shunt placement Thursday. Continues to have ventriculostomy drainage. Trach in place 10/8 VPS shunt scheduled for tomorrow 10/9 VPS shunt scheduled. Pyuria  > treated as UTI 10/10 VP shunt placed 10/13 N/V. Seizure activity. STAT CT head: Aneurysmal re-hemorrhage with marked intraventricular blood and increased hydrocephalus. Increased mass effect outward from parenchymal hemorrhage. 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 The aneurysm measures ~4 mm at the neck, 4.74mm at the dome. This appears to be the likely source of recurrent hemorrhage. Approx 5mm distal right M4 pseudoaneurysm in close proximity to the right frontal ventriculostomy catheter Recanalization of previously occluded M2 branch proximal to the aneurysm.   LINES / TUBES: ETT 9/12 >>9/23  L [place]  9/12 >>>10/9 Ventriculostomy 9/17  >>>10/9 G tube 9/23 >>  Tracheostomy 9/23 >> VP shunt 10/9 >>  R frontal ventric drain 10/13 >>   CULTURES: 9/11 MRSA>>> neg 9/12 Respirstory >>> gram neg cocci, non-pathogenic flora 9/22 Urine >>> E. Coli sensitive to Zosyn 9/22 CSF >>> neg 9/22 Respiratory >>>neg 10/8 Urine>>> E. Coli   ANTIBIOTICS: Unasyn 9/12 >>> 9/20 Vancomycin 9/22 >>> 9/29 Zosyn 9/22 >>> 9/29 Cipro 10/10>>>10/11 Cefazolin 10/11 >> 10/16  SUBJECTIVE:   Staring blankly. Not F/C. NAD  VITAL SIGNS: Temp:  [97.9 F (36.6 C)-100.4 F (38 C)] 100.4 F (38 C) (10/16 1628) Pulse Rate:  [96-119] 107 (10/16 1500) Resp:  [13-46] 20 (10/16 1500) BP: (113-152)/(59-101) 118/75 mmHg (10/16 1500) SpO2:  [97 %-100 %] 98 % (10/16 1500) FiO2 (%):  [28 %] 28 % (10/16 1500)  VENTILATOR SETTINGS: Vent Mode:  [-]  FiO2 (%):  [28 %] 28 %  INTAKE / OUTPUT: Intake/Output     10/15 0701 - 10/16 0700 10/16 0701 - 10/17 0700   I.V. (mL/kg) 120 (2.3) 130 (2.5)   NG/GT 1160 540   IV Piggyback 150 500   Total Intake(mL/kg) 1430 (27.8) 1170 (22.8)   Drains 239 90   Total Output 239 90   Net +1191 +1080        Urine Occurrence 11 x 1 x    PHYSICAL EXAMINATION: Gen: wd female, nad Neuro: not responsive, minimal spont movement. HEENT: NAD. ventric in place Neck: trach site clean PULM: clear anteriorly CV: Regular, 3/6 systolic M AB: Soft abdomen, + BS, G tube site clean Ext: No edema, warm   BMET    Component Value Date/Time   NA 136 10/14/2012 0420   K 3.9 10/14/2012 0420   CL 95* 10/14/2012 0420   CO2 28 10/14/2012 0420  GLUCOSE 119* 10/14/2012 0420   BUN 17 10/14/2012 0420   CREATININE 0.29* 10/14/2012 0420   CALCIUM 10.4 10/14/2012 0420   GFRNONAA >90 10/14/2012 0420   GFRAA >90 10/14/2012 0420    CBC    Component Value Date/Time   WBC 19.6* 10/15/2012 0935   RBC 3.71* 10/15/2012 0935   HGB 10.8* 10/15/2012 0935   HCT 30.5* 10/15/2012 0935   PLT 93* 10/15/2012 0935   MCV 82.2 10/15/2012  0935   MCH 29.1 10/15/2012 0935   MCHC 35.4 10/15/2012 0935   RDW 17.2* 10/15/2012 0935   LYMPHSABS 3.3 09/25/2012 0400   MONOABS 0.8 09/25/2012 0400   EOSABS 0.0 09/25/2012 0400   BASOSABS 0.0 09/25/2012 0400      Recent Labs Lab 10/15/12 0753 10/15/12 1136 10/15/12 1540 10/15/12 2349 10/16/12 0812  GLUCAP 133* 141* 144* 137* 129*   CXR: NNF  ASSESSMENT / PLAN:  PULMONARY A:  Post op respiratory failure Trach tube dependence H/O Asthma, no active wheezing P:   Cont ATC as tolerated Cont albuterol PRN  CARDIOVASCULAR A: 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   P:  Monitor CBC intermittently  INFECTIOUS A:  Suspected aspiration pneumonia, resolved   UTI,  pansens E coli P:   Micro and abx as above   ENDOCRINE A:   Steroid induced hyperglycemia, resolved.   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 Will see again Monday 10/20. Please call sooner PRN  Billy Fischer, MD ; [facility] [phone].  After 5:30 PM or weekends, call [phone]

## 2012-10-16 NOTE — Progress Notes (Signed)
UR completed.  Kaleea Penner, RN BSN MHA CCM Trauma/Neuro ICU Case Manager [phone]  

## 2012-10-16 NOTE — Progress Notes (Signed)
Pt seen and examined. Continues to have intermittent episodes of tachycardia. Bleeding from right frontal EVD site now stopped.  EXAM: Temp:  [97.9 F (36.6 C)-100.2 F (37.9 C)] 100.2 F (37.9 C) (10/16 0309) Pulse Rate:  [96-158] 104 (10/16 1000) Resp:  [13-46] 19 (10/16 1000) BP: (115-152)/(59-101) 116/69 mmHg (10/16 1000) SpO2:  [96 %-100 %] 100 % (10/16 1000) FiO2 (%):  [28 %] 28 % (10/16 1000) Intake/Output     10/15 0701 - 10/16 0700 10/16 0701 - 10/17 0700   I.V. (mL/kg) 120 (2.3) 30 (0.6)   NG/GT 1160 210   IV Piggyback 150 500   Total Intake(mL/kg) 1430 (27.8) 740 (14.4)   Drains 239 26   Total Output 239 26   Net +1191 +714        Urine Occurrence 11 x 1 x    EVD in place with bloody CSF drainage, @ 10. 239cc x 24hrs Eyes open, not tracking Trached, on collar Minimal spont movements RUE, not FC W/D RLE, weakly LLE. No movement LUE Craniectomy site remains tense   LABS: Lab Results  Component Value Date   CREATININE 0.29* 10/14/2012   BUN 17 10/14/2012   NA 136 10/14/2012   K 3.9 10/14/2012   CL 95* 10/14/2012   CO2 28 10/14/2012   Lab Results  Component Value Date   WBC 19.6* 10/15/2012   HGB 10.8* 10/15/2012   HCT 30.5* 10/15/2012   MCV 82.2 10/15/2012   PLT 93* 10/15/2012    IMPRESSION: - 27 y.o. 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

## 2012-10-16 NOTE — Anesthesia Postprocedure Evaluation (Signed)
  Anesthesia Post-op Note  Patient: Sally Nelson  Procedure(s) Performed: Procedure(s): SHUNT INSERTION VENTRICULAR-PERITONEAL (N/A) LAPAROSCOPIC REVISION VENTRICULAR-PERITONEAL (V-P) SHUNT (N/A)  Patient Location: NICU  Anesthesia Type:General  Level of Consciousness: responds to stimulation  Airway and Oxygen Therapy: Patient connected to tracheostomy mask oxygen  Post-op Pain: patient nonverbal  Post-op Assessment: Post-op Vital signs reviewed, Patient's Cardiovascular Status Stable, Respiratory Function Stable and Patent Airway  Post-op Vital Signs: Reviewed and stable  Complications: No apparent anesthesia complications

## 2012-10-16 NOTE — Clinical Social Work Note (Signed)
Clinical Social Worker continuing to follow patient and family for support and discharge planning needs.  Patient father remains supportive at bedside.  CSW was able to make arrangements for patient father to use a shower on 3W and does have vouchers for meals through the weekend.    CSW also spoke with financial counseling to confirm patient benefits.  Due to heart surgeries as a child, patient recieves SSI Disability with her Medicaid and will unfortunately never qualify for Medicare due to her inability to work.  Patient primary coverage is [employer].  CSW will continue to follow patient and family for support and to assist with potential discharge needs.  Macario Golds, [place] [phone]

## 2012-10-16 NOTE — Progress Notes (Signed)
Occupational Therapy Treatment Patient Details Name: Sally Nelson MRN: [id number] DOB: 07-07-1985 Today's Date: 10/16/2012 Time: 4098-1191 OT Time Calculation (min): 16 min  OT Assessment / Plan / Recommendation  History of present illness Pt with new aneurysm found on monday 10/13. 9/12 OR: Craniotomy, clipping R MCA aneurysm for a [facility]  9/12 Head CT: Interval development R SDH, effacement basilar cisterns, evolving R MCA CVA, midline shift 14mm  9/15 Echocardiogram: LVEF 60-65%. Grade 2 diastolic dysfunction. Mildly thickened AoV. Moderately thickened MV leaflets with moderate to severe MR. Moderate pulmonic stenosis  9/23 PEG Tube, tracheostomy placement  10/1 CT head: increasing hydrocephalus, persistent herniation of right frontal and temporal lobes, mild intraventricular hemorrhage  10/6 scheduled for VP shunt placement Thursday. Continues to have ventriculostomy drainage. Trach in place  10/8 VPS shunt scheduled for tomorrow  10/9 VPS shunt scheduled. Pyuria > treated as UTI  10/10 VP shunt placed  10/13 N/V. Seizure activity. STAT CT head: Aneurysmal re-hemorrhage with marked intraventricular blood and increased hydrocephalus. Increased mass effect outward from parenchymal hemorrhage.  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 The aneurysm measures ~4 mm at the neck, 4.30mm at the dome. This appears to be the likely source of recurrent hemorrhage. Approx 5mm distal right M4 pseudoaneurysm in close proximity to the right frontal ventriculostomy catheter Recanalization of previously occluded M2 branch proximal to the aneurysm.      OT comments  Pt seen by "primary" therapist consistently treating pt prior to recent bleed. Eyes open. + blink to threat. No response to familiar stimuli. No movement noted. Increased flexor tone RUE. LUE flaccid. No  visual fixation/scanning. R pupil sluggish to light. No response noted L pupil. Pt's father taught PROM BUE. Will continue to follow and attempt to cotreat with PT tomorrow to continue to assess responsiveness. Will reestablish goals as appropriate.   Follow Up Recommendations  LTACH    Barriers to Discharge       Equipment Recommendations  3 in 1 bedside comode;Tub/shower bench;Wheelchair (measurements OT);Wheelchair cushion (measurements OT);Hospital bed    Recommendations for Other Services    Frequency Min 3X/week   Progress towards OT Goals Progress towards OT goals: Not progressing toward goals - comment (medical decline)  Plan Discharge plan remains appropriate    Precautions / Restrictions Precautions Precautions: Fall Precaution Comments: no bone flap on R side, ventric   Pertinent Vitals/Pain Vitals stable. TC    ADL  ADL Comments: unable to facilitate any movement or follow command    OT Diagnosis:    OT Problem List:   OT Treatment Interventions:     OT Goals(current goals can now be found in the care plan section) Acute Rehab OT Goals Patient Stated Goal: unable to state OT Goal Formulation: With family Time For Goal Achievement: 10/27/12 Potential to Achieve Goals: Good ADL Goals Pt Will Perform Grooming: with mod assist;sitting Additional ADL Goal #1: Pt will maintain sustained attention to task x 2 min with eyes at midline Additional ADL Goal #2: pt will maintain head control in supported sitting with mod A Additional ADL Goal #3: Demonstrate visual scanning to locate target in L visual  field with mod cuing. Additional ADL Goal #4: Family to complete PROM LUE with min A  [facility], OTR/L  [phone] 10/16/2014Visit Information  Last OT Received On: 10/16/12 Assistance Needed: +2 History of Present Illness: Pt with new aneurysm found on  monday 10/13.    Subjective Data      Prior Functioning       Cognition  Cognition Arousal/Alertness:  Awake/alert Behavior During Therapy: Flat affect Overall Cognitive Status: Impaired/Different from baseline General Comments: Pt did not follow any commands today    Mobility       Exercises  Other Exercises Other Exercises: PROM BUE Other Exercises: Educated father on PROM techniques B and how to manage increased tone RUE during PROM   Balance     End of Session OT - End of Session Equipment Utilized During Treatment: Oxygen Activity Tolerance: Patient tolerated treatment well Patient left: in bed;with call bell/phone within reach;with family/visitor present Nurse Communication: Other (comment) (status)  GO     Jamesia Linnen,[name] 10/16/2012, 5:10 PM

## 2012-10-16 NOTE — Progress Notes (Signed)
Stroke Team Progress Note  HISTORY Sally Nelson is an 27 y.o. 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 Patient was taken for emergent craniectomy for decompression in setting of intracranial hypertension. Treatments included concentrated saline, trach, PEG. She was kept on anticonvulsants, vasospasm prophylaxis and TCDs q MWF. Treated for pneumonia during this time frame.   On 09/17, she underwent left frontal ventriculostomy after having decreased mental status while CT head demonstrated acute IVH and hydrocephalus after removal of previous ventricular catheter. Around 09/23/2012 she had episode of distal LICA and MCA/ACA vasospasm which was responsive to hypertension and hypervolemia. It was felt that her hypovolemia was related to bleeding after PEG; she was responsive to transfusion, IVFs, Levophed.   On 10/01 CT head showed increasing hydrocephalus with persistent herniation of right frontal and temporal lobes in setting of mild IVH. Due to persistent hydrocephalus, VP shunt was placed on 10/09/2012. Since that time, the patient had been awake, following commands with right body, showed thumbs up and down to simple questions. She was being evaluated for possible CIR.   On 10/13/2012 a nurse reported seizure like activity, followed by decreased mental status. Father stated that the patient projectile vomited, "a lot". CT head was ordered immediately and showed intracranial hemorrhage, intraventricular hemorrhage, hydrocephalus. The patient went back to the OR and underwent placement of a right frontal ventriculostomy shunt.  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 Tachycardic at  120. No sign of pain or discomfort.  OBJECTIVE Most recent Vital Signs: Filed Vitals:   10/16/12 0400 10/16/12 0412 10/16/12 0500 10/16/12 0600  BP: 117/65 117/65 120/70 116/65  Pulse: 111 107 109 118  Temp:      TempSrc:      Resp: 46 24 23 23   Height:      Weight:      SpO2: 97% 97% 99% 100%   CBG (last 3)   Recent Labs  10/15/12 1136 10/15/12 1540 10/15/12 2349  GLUCAP 141* 144* 137*    IV Fluid Intake:   . sodium chloride 10 mL/hr at 10/15/12 2000    MEDICATIONS  .  ceFAZolin (ANCEF) IV  2 g Intravenous Q8H  . chlorhexidine  15 mL Mouth Rinse BID  . feeding supplement (OSMOLITE 1.2 CAL)  1,000 mL Per Tube Q24H  . feeding supplement (PRO-STAT SUGAR FREE 64)  30 mL Per Tube Daily  . levETIRAcetam  500 mg Per Tube Q12H  . magic mouthwash  5 mL Oral TID  . pantoprazole sodium  40 mg Per Tube Q1200  . simvastatin  80 mg Oral QHS   PRN:  acetaminophen (TYLENOL) oral liquid 160 mg/5 mL, acetaminophen, albuterol, artificial tears, labetalol, loperamide, metoprolol, ondansetron (ZOFRAN) IV, polyvinyl alcohol  Diet:    Feeding liquids Activity:  Bedrest  DVT Prophylaxis:  SCD  CLINICALLY SIGNIFICANT STUDIES Basic Metabolic Panel:   Recent Labs Lab 10/11/12 0430 10/14/12 0420  NA 140 136  K 4.1 3.9  CL 101 95*  CO2 29 28  GLUCOSE 124* 119*  BUN 16 17  CREATININE 0.34* 0.29*  CALCIUM 9.3 10.4   Liver Function Tests:   Recent Labs Lab 10/09/12 1004  AST 31  ALT 43*  ALKPHOS 94  BILITOT 0.5  PROT 7.7  ALBUMIN 3.2*   CBC:   Recent Labs Lab 10/14/12 0420 10/15/12 0935  WBC 17.2* 19.6*  HGB 10.6* 10.8*  HCT 29.9* 30.5*  MCV 81.7 82.2  PLT 102* 93*   Coagulation: No results found for this basename: LABPROT, INR,  in the last 168 hours Cardiac Enzymes: No results found for this basename: CKTOTAL, CKMB, CKMBINDEX, TROPONINI,  in the last 168 hours Urinalysis: No results found for this basename: COLORURINE, APPERANCEUR, LABSPEC, PHURINE, GLUCOSEU,  HGBUR, BILIRUBINUR, KETONESUR, PROTEINUR, UROBILINOGEN, NITRITE, LEUKOCYTESUR,  in the last 168 hours Lipid Panel    Component Value Date/Time   TRIG 111 09/15/2012 0430   HgbA1C  No results found for this basename: HGBA1C    Urine Drug Screen:   No results found for this basename: labopia,  cocainscrnur,  labbenz,  amphetmu,  thcu,  labbarb    Alcohol Level: No results found for this basename: ETH,  in the last 168 hours  Ct Head Wo Contrast 10/13/2012    Aneurysmal re-hemorrhage with marked intraventricular blood and increased hydrocephalus. Increased mass effect outward from parenchymal hemorrhage.   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 The aneurysm measures ~4 mm at the neck,4.35mm at the dome. This appears to be the likely source of recurrent hemorrhage. 2. ~42mm distal right M4 pseudoaneurysm in close proximity to the right frontal ventriculostomy catheter 3. Recanalization of previously occluded M2 branch proximal to the aneurysm.   Dg Abd Portable 1v 10/13/2012  No acute abnormality.    CTA of the brain   09/26/2012   IMPRESSION:  1. Multi focal vasospasm:  *Tandem high-grade distal right ICA stenoses.  *Diffuse high-grade stenosis of the dominant right A1 segment.  *Extensive right M1 vasospasm (with downstream complete MCA  territory infarction).  *Diffuse mild/moderate narrowing of the distal left ICA, left A1  segment, and left M1 segment.  2. Angiographic consideration: Extremely tortuous internal carotid  arteries with focal luminal irregularity at the posterior genu right  cavernous ICA.  3. 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 No  evidence of re-hemorrhage.  4. Stable appearance of ventriculomegaly, intraventricular and  extra-axial hemorrhage, and increased intracranial pressure.   MRI of the brain    MRA of the brain    2D Echocardiogram  EF 60%. No regional wall motion abnormalities, No cardiac source of embolism was identified.   Carotid Doppler    CXR    Therapy Recommendations OT recommending LTACH  Physical Exam   Blood pressure 137/74, pulse 105, temperature 98.7 F (37.1 C), temperature source Oral, resp. rate 20, height 5\' 1"  (1.549 m), weight 51.5 kg (113 lb 8.6 oz), SpO2 95.00%.  Awake alert. Afebrile. Head is nontraumatic. Neck is supple without bruit. . Cardiac exam soft ejection murmur.no gallop.patient has tracheostomy and peg tube Lungs are clear to auscultation. Distal pulses are well felt.  Neurologic Examination:  She is awake; however non responsive. Not following any commands  Eyes open but do not track, no blink to threat.Pupils equal 4 mm reactive. Fundi not visualized.  No response to painful stimuli in any extremity  Increased tone in right arm and will occasionally move it, left arm flaccid  Both legs decreased strength, equally, near flaccid left more than right  No response to painful stimuli in all 4 limbs.     ASSESSMENT Sally Nelson is a 27 y.o. female  presenting with decreased responsiveness on 09/11/2012 and diagnosed with SAH. Prolonged hospital stay requiring clipping of R MCA aneurysm, craniotomy, VP shunt, and with an episode of vomiting yesterday, she developed ICH, IVH, hydrocephalus which required a right frontal ventriculostomy shunt. Cause of the most recent ICH  is due to aneurysm recurrence and rupture induced by severe vomiting  On no antithrombotics prior to admission. Now on no antithrombotics for secondary stroke prevention. Patient otherwise non-responsive with exception of increased tone right arm. She is awake, but nonresponsive.   Hypertension history  Post-operative respiratory failure, trach dependent  Protein-calorie malnutrition, PEG with feeds  Leukocytosis ~19K  Tachycardia, NSR 120.   Hospital day # 35  TREATMENT/PLAN  Continue no antithrombotics  due to aneurysmal bleed for secondary stroke prevention.  Supportive Care at present with no surgical intervention  Will give bolus of 500 NS due to tachycardia and concern for intravascular dehydration although SBP 110  Leukocytosis, may be reactive; however recent history of UTI so will reorder urinalysis.   Dr. Pearlean Brownie discussed the plan of care with Dr. Conchita Paris and the patients father.  Stroke service will sign off.  Gwendolyn Lima. Manson Passey, [facility], MBA, MHA [facility] Pager: [phone] 10/16/2012 8:04 AM    I have personally obtained a history, examined the patient, evaluated imaging results, and formulated the assessment and plan of care. I agree with the above.  Delia Heady, MD

## 2012-10-17 LAB — CBC
HCT: 27 % — ABNORMAL LOW (ref 36.0–46.0)
MCH: 29.2 pg (ref 26.0–34.0)
MCHC: 34.1 g/dL (ref 30.0–36.0)
MCV: 85.7 fL (ref 78.0–100.0)
Platelets: 62 10*3/uL — ABNORMAL LOW (ref 150–400)
RDW: 16.9 % — ABNORMAL HIGH (ref 11.5–15.5)
WBC: 11.6 10*3/uL — ABNORMAL HIGH (ref 4.0–10.5)

## 2012-10-17 LAB — HEPARIN INDUCED THROMBOCYTOPENIA PNL
Heparin Induced Plt Ab: NEGATIVE
Patient O.D.: 0.26
UFH High Dose UFH H: 0 % Release
UFH Low Dose 0.1 IU/mL: 0 % Release
UFH Low Dose 0.5 IU/mL: 0 % Release
UFH SRA Result: NEGATIVE

## 2012-10-17 LAB — BASIC METABOLIC PANEL
BUN: 20 mg/dL (ref 6–23)
CO2: 31 mEq/L (ref 19–32)
Calcium: 9.6 mg/dL (ref 8.4–10.5)
Chloride: 105 mEq/L (ref 96–112)
Creatinine, Ser: 0.38 mg/dL — ABNORMAL LOW (ref 0.50–1.10)
Glucose, Bld: 119 mg/dL — ABNORMAL HIGH (ref 70–99)

## 2012-10-17 LAB — GLUCOSE, CAPILLARY
Glucose-Capillary: 117 mg/dL — ABNORMAL HIGH (ref 70–99)
Glucose-Capillary: 127 mg/dL — ABNORMAL HIGH (ref 70–99)

## 2012-10-17 NOTE — Plan of Care (Signed)
Problem: Acute Rehab OT Goals (only OT should resolve) Goal: Pt. Will Perform Grooming Outcome: Not Met (add Reason) Medical decline Goal: OT Additional ADL Goal #1 Outcome: Not Met (add Reason) Medical decline

## 2012-10-17 NOTE — Progress Notes (Signed)
Occupational Therapy Treatment Patient Details Name: Sally Nelson MRN: [id number] DOB: 1985-07-01 Today's Date: 10/17/2012 Time: 4098-1191 OT Time Calculation (min): 32 min  OT Assessment / Plan / Recommendation  History of present illness   Pt with new aneurysm found on monday 10/13.  9/12 OR: Craniotomy, clipping R MCA aneurysm for a [facility]  9/12 Head CT: Interval development R SDH, effacement basilar cisterns, evolving R MCA CVA, midline shift 14mm  9/15 Echocardiogram: LVEF 60-65%. Grade 2 diastolic dysfunction. Mildly thickened AoV. Moderately thickened MV leaflets with moderate to severe MR. Moderate pulmonic stenosis  9/23 PEG Tube, tracheostomy placement  10/1 CT head: increasing hydrocephalus, persistent herniation of right frontal and temporal lobes, mild intraventricular hemorrhage  10/6 scheduled for VP shunt placement Thursday. Continues to have ventriculostomy drainage. Trach in place  10/8 VPS shunt scheduled for tomorrow  10/9 VPS shunt scheduled. Pyuria > treated as UTI  10/10 VP shunt placed  10/13 N/V. Seizure activity. STAT CT head: Aneurysmal re-hemorrhage with marked intraventricular blood and increased hydrocephalus. Increased mass effect outward from parenchymal hemorrhage.  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 The aneurysm measures ~4 mm at the neck, 4.35mm at the dome. This appears to be the likely source of recurrent hemorrhage. Approx 5mm distal right M4 pseudoaneurysm in close proximity to the right frontal ventriculostomy catheter Recanalization of previously occluded M2 branch proximal to the aneurysm.     OT comments  Pt seen in chair position today. Nsg level ventric drain for session. Head positioned in midline with towel roll. Spontaneous movement observed RUE with pt grasping sheet to pull forward. Pt did not  follow commands, however did grasp therapists hand.?grasp reflex. Clonus noted R hand. Will follow as appropriate.  Follow Up Recommendations  LTACH    Barriers to Discharge       Equipment Recommendations  3 in 1 bedside comode;Tub/shower bench;Wheelchair (measurements OT);Wheelchair cushion (measurements OT);Hospital bed    Recommendations for Other Services    Frequency Min 3X/week   Progress towards OT Goals Progress towards OT goals: Goals drowngraded-see care plan  Plan Discharge plan remains appropriate    Precautions / Restrictions Precautions Precautions: Fall Precaution Comments: no bone flap on R side, ventric   Pertinent Vitals/Pain Vitals stable    ADL  Eating/Feeding: NPO ADL Comments: Pt with grasp on youker.?purposeful. Grasp on hand    OT Diagnosis:    OT Problem List:   OT Treatment Interventions:     OT Goals(current goals can now be found in the care plan section) Acute Rehab OT Goals Patient Stated Goal: unable to state OT Goal Formulation: With family Time For Goal Achievement: 10/31/12 Potential to Achieve Goals: Fair  Visit Information  Last OT Received On: 10/17/12 Assistance Needed: +2    Subjective Data      Prior Functioning       Cognition  Cognition Arousal/Alertness: Lethargic Behavior During Therapy: Flat affect Overall Cognitive Status: Impaired/Different from baseline Area of Impairment: Attention;Following commands;Problem solving;Awareness Current Attention Level: Focused    Mobility  Bed Mobility Details for Bed Mobility Assistance: Pt moved into chair position. Towel roll curved around head to maintain midline.     Exercises  Other Exercises Other Exercises: PROM BUE Other Exercises: visual fixation Other Exercises:  visual scanning   Balance     End of Session OT - End of Session Equipment Utilized During Treatment: Oxygen Activity  Tolerance: Patient tolerated treatment well Patient left: in bed;with call  bell/phone within reach;with family/visitor present Nurse Communication: Other (comment)  GO     Travarius Lange,[name] 10/17/2012, 5:23 PM [facility], OTR/L  [phone] 10/17/2012

## 2012-10-17 NOTE — Progress Notes (Signed)
NUTRITION FOLLOW UP  Intervention:    1. Osmolite 1.2 @ 60 ml/hr  2. 30 ml Prostat daily  Tube feeding regimen provides 1828 kcal, 95 grams of protein, and 1167 ml of H2O.   Nutrition Dx:   Inadequate oral intake related to inability to eat as evidenced by NPO status; ongoing.    Goal:  Intake to meet >90% of estimated nutrition needs, met.   Monitor:  TF initiation/tolerance/adequacy, weight trend, labs, vent status  Assessment:   Pt admitted with a subarachnoid hemorrhage. 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 This was managed with a ventriculostomy and eventual placement of a ventriculoperitoneal shunt. The patient had some seizure activity and decreased mental status 10/13. A stat head CT scan demonstrated intraventricular hemorrhage and hydrocephalus. Pt had a right frontal ventriculostomy placed 10/13.  Trach and PEG 9/23. Pt now on trach collar.  Pt started on scheduled imodium 9/29.  Pt's tube feeding changed to Osmolite 1.2 on 10/2 due to persistent diarrhea. Diarrhea now resolved. Flexiseal removed 10/5. Pt discussed during ICU rounds and with RN.  Last BM documented 10/6. Pt no longer on scheduled imodium. Pt has been off the unit since 10/10 so unsure if any BM's have gone undocumented. Notified RN who will monitor for BM. Per RN pt is less responsive and only moving right upper extremity.  Per MD [name] with displacement of the clip laterally. Plan of care conversations occuring with father.   Height: Ht Readings from Last 1 Encounters:  09/11/12 5\' 1"  (1.549 m)    Weight Status:   Wt Readings from Last 1 Encounters:  10/15/12 113 lb 5.1 oz (51.4 kg)  Admission weight: 118 lb 9/11  Re-estimated needs:  Kcal: 1700-1900 Protein: 80-100 grams  Fluid: >1.5 L/day  Skin: head  and abd incision; stage II pressure ulcer dated 9/27  Diet Order:   NPO    Intake/Output Summary (Last 24 hours) at 10/17/12 1035 Last data filed at 10/17/12 0600  Gross per 24 hour  Intake 1897.5 ml  Output    263 ml  Net 1634.5 ml    Last BM: 10/6  Labs:   Recent Labs Lab 10/11/12 0430 10/14/12 0420 10/17/12 0350  NA 140 136 143  K 4.1 3.9 3.9  CL 101 95* 105  CO2 29 28 31   BUN 16 17 20   CREATININE 0.34* 0.29* 0.38*  CALCIUM 9.3 10.4 9.6  GLUCOSE 124* 119* 119*    CBG (last 3)   Recent Labs  10/16/12 1626 10/17/12 0011 10/17/12 0751  GLUCAP 118* 129* 117*    Scheduled Meds: . chlorhexidine  15 mL Mouth Rinse BID  . feeding supplement (OSMOLITE 1.2 CAL)  1,000 mL Per Tube Q24H  . feeding supplement (PRO-STAT SUGAR FREE 64)  30 mL Per Tube Daily  . levETIRAcetam  500 mg Per Tube Q12H  . magic mouthwash  5 mL Oral TID  . pantoprazole sodium  40 mg Per Tube Q1200  . simvastatin  5 mg Oral QHS    Continuous Infusions: . sodium chloride 10 mL/hr at 10/16/12 [address], LDN, CNSC [phone] Pager [phone] After Hours Pager

## 2012-10-17 NOTE — Progress Notes (Addendum)
PT Cancellation Note  Patient Details Name: Sally Nelson MRN: [id number] DOB: Jul 31, 1985   Cancelled Treatment:    Reason Eval/Treat Not Completed: Other (comment) (Unable to clamp IVF at this time.  )   [name] 10/17/2012, 4:33 PM  Jake Shark, PT DPT [phone]

## 2012-10-17 NOTE — Plan of Care (Signed)
Problem: Acute Rehab OT Goals (only OT should resolve) Goal: OT Additional ADL Goal #1 Outcome: Not Met (add Reason) Medical decline  Problem: Acute Rehab OT Goals (only OT should resolve) Goal: OT Additional ADL Goal #2 Outcome: Not Met (add Reason) Medical decline

## 2012-10-17 NOTE — Progress Notes (Signed)
Subjective: Patient continues on trach collar. Continues on tube feedings and IVF. IVC continuing to drain a moderate amount.  Objective: Vital signs in last 24 hours: Filed Vitals:   10/17/12 0500 10/17/12 0600 10/17/12 0700 10/17/12 0820  BP: 134/85 137/90  140/87  Pulse: 101 116  107  Temp:   100.1 F (37.8 C)   TempSrc:   Axillary   Resp: 22 18  18   Height:      Weight:      SpO2: 95% 99%  100%    Intake/Output from previous day: 10/16 0701 - 10/17 0700 In: 2637.5 [I.V.:637.5; NG/GT:1500; IV Piggyback:500] Out: 289 [Drains:289] Intake/Output this shift:    Physical Exam:  Opens eyes to voice and stimulation, then removed the upper extremities. Not following commands. No attempts at speech. Pupils about 5 mm, bilaterally, minimally reactive to light.  CBC  Recent Labs  10/15/12 0935 10/17/12 0350  WBC 19.6* 11.6*  HGB 10.8* 9.2*  HCT 30.5* 27.0*  PLT 93* 62*   BMET  Recent Labs  10/17/12 0350  NA 143  K 3.9  CL 105  CO2 31  GLUCOSE 119*  BUN 20  CREATININE 0.38*  CALCIUM 9.6    Assessment/Plan: Patient remains with significant depression of level of responsiveness. We'll continue current supportive care.   Hewitt Shorts, MD 10/17/2012, 11:34 AM \

## 2012-10-18 LAB — GLUCOSE, CAPILLARY
Glucose-Capillary: 121 mg/dL — ABNORMAL HIGH (ref 70–99)
Glucose-Capillary: 123 mg/dL — ABNORMAL HIGH (ref 70–99)
Glucose-Capillary: 125 mg/dL — ABNORMAL HIGH (ref 70–99)

## 2012-10-18 NOTE — Progress Notes (Signed)
Subjective: Patient continues on trach collar. Continues to receive tube feedings and IVF. IVC draining well.  Objective: Vital signs in last 24 hours: Filed Vitals:   10/18/12 0815 10/18/12 0830 10/18/12 0835 10/18/12 0900  BP: 146/97 140/86 140/86 146/96  Pulse: 111 108 114 107  Temp:      TempSrc:      Resp: 16 15 19 15   Height:      Weight:      SpO2: 100% 100% 100% 100%    Intake/Output from previous day: 10/17 0701 - 10/18 0700 In: 3520 [I.V.:1800; NG/GT:1720] Out: 254 [Urine:3; Drains:251] Intake/Output this shift: Total I/O In: 370 [I.V.:150; NG/GT:220] Out: 41 [Drains:41]  Physical Exam:  Opening eyes spontaneously. Nursing staff reports minimal movement of extremities at various times. Patient not following commands. Cannot elicit motor response to pain.  CBC  Recent Labs  10/17/12 0350  WBC 11.6*  HGB 9.2*  HCT 27.0*  PLT 62*   BMET  Recent Labs  10/17/12 0350  NA 143  K 3.9  CL 105  CO2 31  GLUCOSE 119*  BUN 20  CREATININE 0.38*  CALCIUM 9.6    Assessment/Plan: Continue supportive care. Spoke with the patient's father who is at bedside, and answered his questions for him.   Hewitt Shorts, MD 10/18/2012, 10:23 AM

## 2012-10-19 LAB — GLUCOSE, CAPILLARY
Glucose-Capillary: 132 mg/dL — ABNORMAL HIGH (ref 70–99)
Glucose-Capillary: 133 mg/dL — ABNORMAL HIGH (ref 70–99)
Glucose-Capillary: 136 mg/dL — ABNORMAL HIGH (ref 70–99)

## 2012-10-19 NOTE — Progress Notes (Signed)
Subjective: Patient continues on trach collar. Continues on tube feedings and IVF. Cranial incisions from shunt well-healed. IVC draining somewhat bloodier (dark) CSF.  Objective: Vital signs in last 24 hours: Filed Vitals:   10/19/12 0320 10/19/12 0400 10/19/12 0500 10/19/12 0600  BP: 147/97 159/101 142/97 144/94  Pulse: 105 108 118 101  Temp:  102.1 F (38.9 C)    TempSrc:  Axillary    Resp: 14 18 18 19   Height:      Weight:      SpO2: 100% 100% 100% 100%    Intake/Output from previous day: 10/18 0701 - 10/19 0700 In: 3265 [I.V.:1725; NG/GT:1480] Out: 317 [Drains:317] Intake/Output this shift:    Physical Exam:  Opening eyes spontaneously, otherwise little the way of neurologic response.  CBC  Recent Labs  10/17/12 0350  WBC 11.6*  HGB 9.2*  HCT 27.0*  PLT 62*   BMET  Recent Labs  10/17/12 0350  NA 143  K 3.9  CL 105  CO2 31  GLUCOSE 119*  BUN 20  CREATININE 0.38*  CALCIUM 9.6    Assessment/Plan: Neurologically without change. We'll continue current supportive care. Spoke with the patient's father at bedside.   Hewitt Shorts, MD 10/19/2012, 7:24 AM

## 2012-10-20 ENCOUNTER — Inpatient Hospital Stay (HOSPITAL_COMMUNITY): Payer: Medicaid Other

## 2012-10-20 ENCOUNTER — Encounter (HOSPITAL_COMMUNITY): Payer: Self-pay | Admitting: Neurosurgery

## 2012-10-20 LAB — GLUCOSE, CAPILLARY
Glucose-Capillary: 124 mg/dL — ABNORMAL HIGH (ref 70–99)
Glucose-Capillary: 128 mg/dL — ABNORMAL HIGH (ref 70–99)
Glucose-Capillary: 139 mg/dL — ABNORMAL HIGH (ref 70–99)

## 2012-10-20 LAB — CBC WITH DIFFERENTIAL/PLATELET
Eosinophils Absolute: 0 10*3/uL (ref 0.0–0.7)
Hemoglobin: 9.1 g/dL — ABNORMAL LOW (ref 12.0–15.0)
Lymphocytes Relative: 24 % (ref 12–46)
Lymphs Abs: 3 10*3/uL (ref 0.7–4.0)
MCHC: 35.4 g/dL (ref 30.0–36.0)
MCV: 80.8 fL (ref 78.0–100.0)
Monocytes Absolute: 0.7 10*3/uL (ref 0.1–1.0)
Monocytes Relative: 6 % (ref 3–12)
Neutro Abs: 8.6 10*3/uL — ABNORMAL HIGH (ref 1.7–7.7)
Neutrophils Relative %: 70 % (ref 43–77)
RBC: 3.18 MIL/uL — ABNORMAL LOW (ref 3.87–5.11)
WBC: 12.4 10*3/uL — ABNORMAL HIGH (ref 4.0–10.5)

## 2012-10-20 LAB — CSF CELL COUNT WITH DIFFERENTIAL
Eosinophils, CSF: 0 % (ref 0–1)
RBC Count, CSF: 181500 /mm3 — ABNORMAL HIGH
Segmented Neutrophils-CSF: 82 % — ABNORMAL HIGH (ref 0–6)
WBC, CSF: 233 /mm3 (ref 0–5)

## 2012-10-20 LAB — URINALYSIS, ROUTINE W REFLEX MICROSCOPIC
Bilirubin Urine: NEGATIVE
Glucose, UA: NEGATIVE mg/dL
Ketones, ur: NEGATIVE mg/dL
Leukocytes, UA: NEGATIVE
Nitrite: NEGATIVE
Specific Gravity, Urine: 1.022 (ref 1.005–1.030)
pH: 7 (ref 5.0–8.0)

## 2012-10-20 LAB — PROTEIN AND GLUCOSE, CSF
Glucose, CSF: 70 mg/dL (ref 43–76)
Total  Protein, CSF: 73 mg/dL — ABNORMAL HIGH (ref 15–45)

## 2012-10-20 LAB — GRAM STAIN

## 2012-10-20 NOTE — Clinical Social Work Note (Signed)
Clinical Social Worker continuing to follow patient and family for support and discharge planning needs.  CSW was able to provide continued meal vouchers to patient father throughout patient duration of hospital stay.  Patient father states that he his hopeful for his brother in law to take him home one day this week for extra rest.  Patient with repeat head CT today and cultures pending.  Patient remains on trach collar.  CSW will continue to follow patient and patient father for continued support and discuss patient plans of discharge once medically appropriate.  Macario Golds, [place] [phone]

## 2012-10-20 NOTE — Progress Notes (Signed)
Pt seen and examined. No issues overnight.  EXAM: Temp:  [98.2 F (36.8 C)-102.9 F (39.4 C)] 102.9 F (39.4 C) (10/20 0700) Pulse Rate:  [101-131] 129 (10/20 0900) Resp:  [15-30] 20 (10/20 0900) BP: (131-161)/(77-101) 138/81 mmHg (10/20 0900) SpO2:  [98 %-100 %] 99 % (10/20 0900) FiO2 (%):  [28 %] 28 % (10/20 0900) Intake/Output     10/19 0701 - 10/20 0700 10/20 0701 - 10/21 0700   I.V. (mL/kg) 1800 (35) 150 (2.9)   Other 120    NG/GT 1440 120   Total Intake(mL/kg) 3360 (65.4) 270 (5.3)   Drains 324 27   Total Output 324 27   Net +3036 +243        Urine Occurrence 6 x 1 x    EVD in place, bloody CSF. 324cc x 24hrs Eyes open, not tracking On trach collar Has spontaneous movements RUE, no movement RLE, LUE/LLE Wounds c/d/i  LABS: Lab Results  Component Value Date   CREATININE 0.38* 10/17/2012   BUN 20 10/17/2012   NA 143 10/17/2012   K 3.9 10/17/2012   CL 105 10/17/2012   CO2 31 10/17/2012   Lab Results  Component Value Date   WBC 11.6* 10/17/2012   HGB 9.2* 10/17/2012   HCT 27.0* 10/17/2012   MCV 85.7 10/17/2012   PLT 62* 10/17/2012    IMPRESSION: - 27 y.o. female SAH d# 48 s/p repeat IVH - Neurologically unchanged  PLAN: - Repeat CTH today to evaluate progression of IVH - Tachycardia possibly related to fever - Stable on trach collar - Cont TF at current rate - Cont maintenance IVF - Febrile - will re-check pan culture with CSF, CBC

## 2012-10-20 NOTE — Progress Notes (Addendum)
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 PCCM continuing to follow for post op vent management.  SIGNIFICANT EVENTS / STUDIES:  9/12 OR: Craniotomy, clipping R MCA aneurysm for a [facility] 9/12 Head CT: Interval development R SDH, effacement basilar cisterns, evolving R MCA CVA, midline shift 14mm 9/15 Echocardiogram: LVEF 60-65%. Grade 2 diastolic dysfunction. Mildly thickened AoV. Moderately thickened MV leaflets with moderate to severe MR. Moderate pulmonic stenosis 9/23 PEG Tube, tracheostomy placement 10/1 CT head: increasing hydrocephalus, persistent herniation of right frontal and temporal lobes, mild intraventricular hemorrhage 10/6 scheduled for VP shunt placement Thursday. Continues to have ventriculostomy drainage. Trach in place 10/8 VPS shunt scheduled for tomorrow 10/9 VPS shunt scheduled. Pyuria  > treated as UTI 10/10 VP shunt placed 10/13 N/V. Seizure activity. STAT CT head: Aneurysmal re-hemorrhage with marked intraventricular blood and increased hydrocephalus. Increased mass effect outward from parenchymal hemorrhage. 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 The aneurysm measures ~4 mm at the neck, 4.80mm at the dome. This appears to be the likely source of recurrent hemorrhage. Approx 5mm distal right M4 pseudoaneurysm in close proximity to the right frontal ventriculostomy catheter Recanalization of previously occluded M2 branch proximal to the aneurysm. 10/20 CT head>>>  LINES / TUBES: ETT 9/12 >>9/23  L [place]  9/12  >>>10/9 Ventriculostomy 9/17 >>>10/9 G tube 9/23 >>  Tracheostomy 9/23 >> VP shunt 10/9 >>  R frontal ventric drain 10/13 >>   CULTURES: 9/11 MRSA>>> neg 9/12 Respirstory >>> gram neg cocci, non-pathogenic flora 9/22 Urine >>> E. Coli sensitive to Zosyn 9/22 CSF >>> neg 9/22 Respiratory >>>neg 10/8 Urine>>> E. Coli  10/20 Urine >> 10/20 CSF culture >> 10/20 Blood >>  ANTIBIOTICS: Unasyn 9/12 >>> 9/20 Vancomycin 9/22 >>> 9/29 Zosyn 9/22 >>> 9/29 Cipro 10/10>>>10/11 Cefazolin 10/11 >> 10/16  SUBJECTIVE:   Staring blankly. Not F/C. NAD  VITAL SIGNS: Temp:  [98.2 F (36.8 C)-102.9 F (39.4 C)] 102.9 F (39.4 C) (10/20 0700) Pulse Rate:  [101-131] 131 (10/20 0751) Resp:  [15-30] 17 (10/20 0751) BP: (131-161)/(77-101) 161/99 mmHg (10/20 0700) SpO2:  [98 %-100 %] 100 % (10/20 0751) FiO2 (%):  [28 %] 28 % (10/20 0751)  VENTILATOR SETTINGS: Vent Mode:  [-]  FiO2 (%):  [28 %] 28 %  INTAKE / OUTPUT: Intake/Output     10/19 0701 - 10/20 0700 10/20 0701 - 10/21 0700   I.V. (mL/kg) 1800 (35)    Other 120    NG/GT 1440    Total Intake(mL/kg) 3360 (65.4)    Drains 324    Total Output 324     Net +3036          Urine Occurrence 6 x     PHYSICAL EXAMINATION: Gen: wd female, nad with right ventriculostomy drain, blank stare Neuro: Pupils equal and minimal reaction to light, minimal spontaneous movement noted.   HEENT: NAD. ventric in place draining bloody fluid.  Bandages remain clean and dry Neck: trach site clean PULM: clear anteriorly CV: Regular, 3/6 systolic M AB: Soft abdomen, + BS, G tube site clean Ext: No edema, warm  BMET    Component Value Date/Time   NA 143 10/17/2012 0350   K 3.9 10/17/2012 0350   CL 105 10/17/2012 0350   CO2 31 10/17/2012 0350   GLUCOSE 119* 10/17/2012 0350   BUN 20 10/17/2012 0350   CREATININE 0.38* 10/17/2012 0350   CALCIUM 9.6 10/17/2012 0350   GFRNONAA >90 10/17/2012 0350   GFRAA >90 10/17/2012 0350    CBC     Component Value Date/Time   WBC 11.6* 10/17/2012 0350   RBC 3.15* 10/17/2012 0350   HGB 9.2* 10/17/2012 0350   HCT 27.0* 10/17/2012 0350   PLT 62* 10/17/2012 0350   MCV 85.7 10/17/2012 0350   MCH 29.2 10/17/2012 0350   MCHC 34.1 10/17/2012 0350   RDW 16.9* 10/17/2012 0350   LYMPHSABS 3.3 09/25/2012 0400   MONOABS 0.8 09/25/2012 0400   EOSABS 0.0 09/25/2012 0400   BASOSABS 0.0 09/25/2012 0400      Recent Labs Lab 10/19/12 0040 10/19/12 0839 10/19/12 1641 10/20/12 0008 10/20/12 0737  GLUCAP 133* 132* 136* 139* 128*   CXR: NNF  ASSESSMENT / PLAN:  PULMONARY A:  Post op respiratory failure Trach tube dependence H/O Asthma, no active wheezing P:   Cont trach collar as tolerated albuterol PRN for wheeze pcxr for atx  CARDIOVASCULAR A: 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   Some of this dilutional , sepsis? P:  CBC 10/20 needed Cbc in am as well for trend May need lasix for dilutional contribution, avoid with rebleed as able however Treating infection  INFECTIOUS A:  Suspected aspiration pneumonia, resolved   UTI,  pansens E coli treatment d/c 10/16 Intermittently febrile P:   Will check WBC UA now [facility] sent Low threshold use BBB ABX - vanc, ceftaz If CT head shows abscess (unlikely), add flagyl to above Csf evaluation done Assess pcxr for infiltate  ENDOCRINE A:   Steroid induced hyperglycemia, resolved.   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 AAF with relatively unchanged neurostatus post op clipping of aneurysm.  Will check CBC to determine status of  thrombocytopenia and anemia.  Will also look at white count as patient is febrile this morning.  Patient recently treated for pan-sensitive E. Coli UTI.  For sources of fever consider infection vs. central fever.   Not currently on antibiotics at this time.  Neurosurgery has put in orders for cultures for CSF, urine, and blood.  Will follow up in morning.  Add UA.  Carley Hammed Physician Assistant Student 10/20/12, 9:15 am   I have fully examined this patient and agree with above findings.    aned eidted in full  Mcarthur Rossetti. Tyson Alias, MD, FACP Pgr: [phone] [place] [facility]

## 2012-10-20 NOTE — Progress Notes (Signed)
CRITICAL VALUE ALERT  Critical value received:  Spinal Fluid White cell count 233   Date of notification:  10/20/2012  Time of notification:  1124  Critical value read back:yes  Nurse who received alert:  Harlan Stains  MD notified (1st page):  1125  Time of first page:  1125  MD notified (2nd page):  Time of second page:  Responding MD:  Dr. Conchita Paris  Time MD responded:  1125  No new orders at this time

## 2012-10-21 DIAGNOSIS — R509 Fever, unspecified: Secondary | ICD-10-CM | POA: Diagnosis not present

## 2012-10-21 DIAGNOSIS — Z9889 Other specified postprocedural states: Secondary | ICD-10-CM

## 2012-10-21 LAB — URINE CULTURE

## 2012-10-21 LAB — COMPREHENSIVE METABOLIC PANEL
ALT: 71 U/L — ABNORMAL HIGH (ref 0–35)
Albumin: 3 g/dL — ABNORMAL LOW (ref 3.5–5.2)
Alkaline Phosphatase: 110 U/L (ref 39–117)
BUN: 13 mg/dL (ref 6–23)
CO2: 27 mEq/L (ref 19–32)
Calcium: 9.4 mg/dL (ref 8.4–10.5)
Creatinine, Ser: 0.21 mg/dL — ABNORMAL LOW (ref 0.50–1.10)
GFR calc Af Amer: >90 mL/min (ref >90)
GFR calc non Af Amer: >90 mL/min (ref >90)
Potassium: 4.1 mEq/L (ref 3.5–5.1)
Sodium: 132 mEq/L — ABNORMAL LOW (ref 135–145)
Total Protein: 7.7 g/dL (ref 6.0–8.3)

## 2012-10-21 LAB — CBC WITH DIFFERENTIAL/PLATELET
Basophils Relative: 0 % (ref 0–1)
Eosinophils Absolute: 0 10*3/uL (ref 0.0–0.7)
Eosinophils Relative: 0 % (ref 0–5)
Lymphocytes Relative: 25 % (ref 12–46)
MCH: 29 pg (ref 26.0–34.0)
MCHC: 35.7 g/dL (ref 30.0–36.0)
MCV: 81.3 fL (ref 78.0–100.0)
Monocytes Relative: 8 % (ref 3–12)
Neutrophils Relative %: 66 % (ref 43–77)
Platelets: 108 10*3/uL — ABNORMAL LOW (ref 150–400)

## 2012-10-21 LAB — GLUCOSE, CAPILLARY: Glucose-Capillary: 121 mg/dL — ABNORMAL HIGH (ref 70–99)

## 2012-10-21 LAB — PATHOLOGIST SMEAR REVIEW

## 2012-10-21 NOTE — Progress Notes (Signed)
Pt seen and examined. No issues overnight.  EXAM: Temp:  [97.9 F (36.6 C)-102.6 F (39.2 C)] 99.4 F (37.4 C) (10/21 0815) Pulse Rate:  [98-133] 114 (10/21 1000) Resp:  [12-22] 20 (10/21 1000) BP: (127-156)/(79-101) 156/101 mmHg (10/21 1000) SpO2:  [98 %-100 %] 100 % (10/21 1000) FiO2 (%):  [28 %] 28 % (10/21 1000) Intake/Output     10/20 0701 - 10/21 0700 10/21 0701 - 10/22 0700   I.V. (mL/kg) 1800 (35) 225 (4.4)   Other     NG/GT 1440 270   Total Intake(mL/kg) 3240 (63) 495 (9.6)   Urine (mL/kg/hr) 150 (0.1)    Drains 236 (0.2) 51 (0.3)   Total Output 386 51   Net +2854 +444        Urine Occurrence 6 x     EVD in place, dark bloody CSF 236cc x 24 hrs Easily arousable, not tracking Trached, on collar Moves RUE spontaneously, no movement of LUE/LLE. Minimal movement RLE Wound c/d/i  LABS: Lab Results  Component Value Date   CREATININE 0.21* 10/21/2012   BUN 13 10/21/2012   NA 132* 10/21/2012   K 4.1 10/21/2012   CL 95* 10/21/2012   CO2 27 10/21/2012   Lab Results  Component Value Date   WBC 11.0* 10/21/2012   HGB 9.6* 10/21/2012   HCT 26.9* 10/21/2012   MCV 81.3 10/21/2012   PLT 108* 10/21/2012    IMAGING: CTH reviewed, demonstrating evolution of previous IVH, now with small right TO/tentorial SDH.  IMPRESSION: - 27 y.o. female SAH d# 40 s/p rebleed of recurrent RMCA aneurysm - Neurologically unchanged  PLAN: - Cont EVD at . Will evaluate left frontal VPS when IVH is more resolved - Cx negative to date - Check BLE/BUE duplex - Cont supportive care, SBP < 

## 2012-10-21 NOTE — Progress Notes (Signed)
NUTRITION FOLLOW UP  Intervention:    1. Osmolite 1.2 @ 60 ml/hr  2. 30 ml Prostat daily  Tube feeding regimen provides 1828 kcal, 95 grams of protein, and 1167 ml of H2O.   Nutrition Dx:   Inadequate oral intake related to inability to eat as evidenced by NPO status; ongoing.    Goal:  Intake to meet >90% of estimated nutrition needs, met.   Monitor:  TF initiation/tolerance/adequacy, weight trend, labs, vent status  Assessment:   Pt admitted with a subarachnoid hemorrhage. 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 This was managed with a ventriculostomy and eventual placement of a ventriculoperitoneal shunt. The patient had some seizure activity and decreased mental status 10/13. A stat head CT scan demonstrated intraventricular hemorrhage and hydrocephalus. Pt had a right frontal ventriculostomy placed 10/13.  Trach and PEG 9/23. Pt now on trach collar.   Per MD [name] with displacement of the clip laterally. IVC continues to drain.    Height: Ht Readings from Last 1 Encounters:  09/11/12 5\' 1"  (1.549 m)    Weight Status:   Wt Readings from Last 1 Encounters:  10/15/12 113 lb 5.1 oz (51.4 kg)  Admission weight: 118 lb 9/11; weight has been up and down.   Re-estimated needs:  Kcal: 1700-1900 Protein: 80-100 grams  Fluid: >1.5 L/day  Skin: head and abd incision; stage II pressure ulcer dated 9/27  Diet Order:   NPO    Intake/Output Summary (Last 24 hours) at 10/21/12 1245 Last data filed at 10/21/12 1100  Gross per 24 hour  Intake   3285 ml  Output    418 ml  Net   2867 ml    Last BM: 10/17  Labs:   Recent Labs Lab 10/17/12 0350 10/21/12 0430  NA 143 132*  K 3.9 4.1  CL 105 95*  CO2 31 27  BUN 20 13  CREATININE 0.38* 0.21*  CALCIUM 9.6 9.4  GLUCOSE 119* 139*     CBG (last 3)   Recent Labs  10/20/12 1549 10/21/12 0229 10/21/12 0745  GLUCAP 124* 148* 123*    Scheduled Meds: . chlorhexidine  15 mL Mouth Rinse BID  . feeding supplement (OSMOLITE 1.2 CAL)  1,000 mL Per Tube Q24H  . feeding supplement (PRO-STAT SUGAR FREE 64)  30 mL Per Tube Daily  . levETIRAcetam  500 mg Per Tube Q12H  . magic mouthwash  5 mL Oral TID  . pantoprazole sodium  40 mg Per Tube Q1200  . simvastatin  5 mg Oral QHS    Continuous Infusions: . sodium chloride 75 mL/hr at 10/21/12 [address], LDN, CNSC [phone] Pager [phone] After Hours Pager

## 2012-10-21 NOTE — Progress Notes (Signed)
Bilateral lower and upper extremity venous duplex completed.  Bilateral lower extremity venous:  No evidence of DVT, superficial thrombosis, or Baker's Cyst.  Bilateral upper extremity venous:  No evidence of DVT.  There appears to be superficial thrombosis in the left basilic vein.

## 2012-10-21 NOTE — Progress Notes (Signed)
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 PCCM continuing to follow for post op vent management.  SIGNIFICANT EVENTS / STUDIES:  9/12 OR: Craniotomy, clipping R MCA aneurysm for a [facility] 9/12 Head CT: Interval development R SDH, effacement basilar cisterns, evolving R MCA CVA, midline shift 14mm 9/15 Echocardiogram: LVEF 60-65%. Grade 2 diastolic dysfunction. Mildly thickened AoV. Moderately thickened MV leaflets with moderate to severe MR. Moderate pulmonic stenosis 9/23 PEG Tube, tracheostomy placement 10/1 CT head: increasing hydrocephalus, persistent herniation of right frontal and temporal lobes, mild intraventricular hemorrhage 10/6 scheduled for VP shunt placement Thursday. Continues to have ventriculostomy drainage. Trach in place 10/8 VPS shunt scheduled for tomorrow 10/9 VPS shunt scheduled. Pyuria  > treated as UTI 10/10 VP shunt placed 10/13 N/V. Seizure activity. STAT CT head: Aneurysmal re-hemorrhage with marked intraventricular blood and increased hydrocephalus. Increased mass effect outward from parenchymal hemorrhage. 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 The aneurysm measures ~4 mm at the neck, 4.21mm at the dome. This appears to be the likely source of recurrent hemorrhage. Approx 5mm distal right M4 pseudoaneurysm in close proximity to the right frontal ventriculostomy catheter Recanalization of previously occluded M2 branch proximal to the aneurysm. 10/20 CT head>>> Partial decompression of the ventricles following right frontal   ventriculostomy. The volume of intraventricular hemorrhage has  slightly improved. No significant change in right temporal lobe hematoma,  surrounding vasogenic edema and peripheral brain herniation through  the right craniotomy. New small right temporal parietal subdural hematoma.    LINES / TUBES: ETT 9/12 >>9/23  L [place]  9/12 >>>10/9 Ventriculostomy 9/17 >>>10/9 G tube 9/23 >>  Tracheostomy 9/23 >> VP shunt 10/9 >>  R frontal ventric drain 10/13 >>   CULTURES: 9/11 MRSA>>> neg 9/12 Respirstory >>> gram neg cocci, non-pathogenic flora 9/22 Urine >>> E. Coli sensitive to Zosyn 9/22 CSF >>> neg 9/22 Respiratory >>>neg 10/8 Urine>>> E. Coli  10/20 Urine >> 10/20 CSF culture >> 10/20 Blood >>  ANTIBIOTICS: Unasyn 9/12 >>> 9/20 Vancomycin 9/22 >>> 9/29 Zosyn 9/22 >>> 9/29 Cipro 10/10>>>10/11 Cefazolin 10/11 >> 10/16  SUBJECTIVE:   Staring blankly. Not F/C. NAD  VITAL SIGNS: Temp:  [97.9 F (36.6 C)-102.6 F (39.2 C)] 99.4 F (37.4 C) (10/21 0815) Pulse Rate:  [98-133] 133 (10/21 0743) Resp:  [12-22] 12 (10/21 0743) BP: (127-153)/(79-101) 148/94 mmHg (10/21 0743) SpO2:  [98 %-100 %] 100 % (10/21 0743) FiO2 (%):  [28 %] 28 % (10/21 0743) Trach collar 28%  INTAKE / OUTPUT: Intake/Output     10/20 0701 - 10/21 0700 10/21 0701 - 10/22 0700   I.V. (mL/kg) 1800 (35)    Other     NG/GT 1440    Total Intake(mL/kg) 3240 (63)    Urine (mL/kg/hr) 150 (0.1)    Drains 236 (0.2)    Total Output 386     Net +2854          Urine Occurrence 6 x     PHYSICAL EXAMINATION: Gen: wd female, nad with right ventriculostomy drain, blank stare Neuro: Pupils equal and minimal reaction to light, minimal  spontaneous movement noted.   HEENT: NAD. ventric in place draining bloody fluid.  Bandages remain clean and dry.   Neck: trach site clean PULM: clear anteriorly CV: Regular, 3/6 systolic M heard best on left sternal border AB: Soft abdomen, + BS, G tube site clean Ext: No edema,  warm   Labs: CBC Recent Labs     10/20/12  1020  10/21/12  0430  WBC  12.4*  11.0*  HGB  9.1*  9.6*  HCT  25.7*  26.9*  PLT  102*  108*    Coag's No results found for this basename: APTT, INR,  in the last 72 hours  BMET Recent Labs     10/21/12  0430  NA  132*  K  4.1  CL  95*  CO2  27  BUN  13  CREATININE  0.21*  GLUCOSE  139*    Electrolytes Recent Labs     10/21/12  0430  CALCIUM  9.4    Sepsis Markers No results found for this basename: LACTICACIDVEN, PROCALCITON, O2SATVEN,  in the last 72 hours  ABG No results found for this basename: PHART, PCO2ART, PO2ART,  in the last 72 hours  Liver Enzymes Recent Labs     10/21/12  0430  AST  51*  ALT  71*  ALKPHOS  110  BILITOT  0.3  ALBUMIN  3.0*    Cardiac Enzymes No results found for this basename: TROPONINI, PROBNP,  in the last 72 hours  Glucose Recent Labs     10/19/12  1641  10/20/12  0008  10/20/12  0737  10/20/12  1549  10/21/12  0229  10/21/12  0745  GLUCAP  136*  139*  128*  124*  148*  123*    Imaging Ct Head Wo Contrast  10/20/2012   CLINICAL DATA:  Follow up intraventricular hemorrhage. Recent right middle cerebral artery aneurysm clipping.  EXAM: CT HEAD WITHOUT CONTRAST  TECHNIQUE: Contiguous axial images were obtained from the base of the skull through the vertex without intravenous contrast.  COMPARISON:  Head CT 10/13/2012 and 10/10/2012.  FINDINGS: There has been interval placement of a right frontal ventriculostomy, tip in the right frontal horn. The left frontal ventriculostomy catheter is unchanged. Hydrocephalus has mildly improved. There is still extensive blood throughout the lateral and 3rd ventricles. There is no significant residual blood in the 4th ventricle. The hemorrhage within the right temporal lobe and surrounding vasogenic edema have not significantly changed. The degree of brain herniation through the craniotomy is unchanged. There is minimal residual left to  right midline shift.  There is a new small subdural hematoma tracking along the right parietal lobe, interhemispheric falx and tentorium. This measures 7 mm maximally. Subdural blood tracks anteriorly along the floor of the right middle cranial fossa.  No acute calvarial findings are identified. The visualized paranasal sinuses, mastoid air cells and middle ears remain clear.  IMPRESSION: 1. Partial decompression of the ventricles following right frontal ventriculostomy. The volume of intraventricular hemorrhage has slightly improved. 2. No significant change in right temporal lobe hematoma, surrounding vasogenic edema and peripheral brain herniation through the right craniotomy. 3. New small right temporal parietal subdural hematoma.   Electronically Signed   By: Roxy Horseman M.D.   On: 10/20/2012 13:58   Dg Chest Port 1 View  10/20/2012   CLINICAL DATA:  Intracranial hemorrhage.  EXAM: PORTABLE CHEST - 1 VIEW  COMPARISON:  Portable chest 10/07/2012.  FINDINGS: 1236 hr. Tracheostomy appears well  positioned. A left ventriculoperitoneal shunt is noted. There is residual patchy atelectasis at the right lung base. No consolidation or significant pleural effusion is identified. The heart size and mediastinal contours are stable.  IMPRESSION: Mild residual right basilar atelectasis. No new findings demonstrated.   Electronically Signed   By: Roxy Horseman M.D.   On: 10/20/2012 14:54    ASSESSMENT / PLAN:  PULMONARY A:  Post op respiratory failure. Trach tube dependence. H/O Asthma. 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   P:  Trend platelets on CBC Hgb goal > 7  INFECTIOUS A:  Suspected aspiration  pneumonia, resolved   UTI,  pansens E coli treatment d/c 10/16 Intermittently febrile >> ? Central versus infection WBCs in CSF P:   -f/u cultures -monitor off Abx  ENDOCRINE A:   Steroid induced hyperglycemia, resolved.   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 AAF with relatively unchanged neurostatus post op clipping of aneurysm with rebleed.  Patient has been intermittently febrile for the past two days.  Currently afebrile.  UA and chest xray negative.  CSF micro shows 233 WBCs with majority neutrophils.  WBCs may be from inflammation and reabsorption of blood due to [facility].  Blood, sputum, urine, and CSF cultures pending.  Not currently on antibiotics at this time.  Low threshold to use antibiotics as patient has craniotomy flap with ventric drain.   Carley Hammed Physician Assistant Student 10/21/12, 8:49 am  Reviewed above, examined pt, and documentation changes made as needed.  She has recurrent fever, but no other obvious evidence for infection.  Will f/u Cx results, and CXR.  Monitor off Abx.    Coralyn Helling, MD [facility] 10/21/2012, 10:07 AM Pager:  [phone] After 3pm call: [phone]

## 2012-10-21 NOTE — Progress Notes (Signed)
PT Cancellation Note  Patient Details Name: Sally Nelson MRN: [id number] DOB: [birth year]   Cancelled Treatment:    Reason Eval/Treat Not Completed: Other (comment) Attempted to work with pt, however RN just completed neurocheck/assessment and pt worn out. Pt with no eye opening or response. PT to return when able.   Marcene Brawn 10/21/2012, 5:03 PM

## 2012-10-22 ENCOUNTER — Inpatient Hospital Stay (HOSPITAL_COMMUNITY): Payer: Medicaid Other

## 2012-10-22 DIAGNOSIS — J189 Pneumonia, unspecified organism: Secondary | ICD-10-CM | POA: Diagnosis not present

## 2012-10-22 DIAGNOSIS — R509 Fever, unspecified: Secondary | ICD-10-CM

## 2012-10-22 LAB — CBC
Hemoglobin: 9.2 g/dL — ABNORMAL LOW (ref 12.0–15.0)
MCH: 28.8 pg (ref 26.0–34.0)
MCV: 81.6 fL (ref 78.0–100.0)
Platelets: 124 10*3/uL — ABNORMAL LOW (ref 150–400)
RDW: 15.8 % — ABNORMAL HIGH (ref 11.5–15.5)
WBC: 11.6 10*3/uL — ABNORMAL HIGH (ref 4.0–10.5)

## 2012-10-22 LAB — GLUCOSE, CAPILLARY: Glucose-Capillary: 106 mg/dL — ABNORMAL HIGH (ref 70–99)

## 2012-10-22 LAB — CULTURE, RESPIRATORY W GRAM STAIN

## 2012-10-22 LAB — BASIC METABOLIC PANEL
Calcium: 9.3 mg/dL (ref 8.4–10.5)
Chloride: 96 mEq/L (ref 96–112)
Creatinine, Ser: 0.21 mg/dL — ABNORMAL LOW (ref 0.50–1.10)
GFR calc Af Amer: >90 mL/min (ref >90)
Sodium: 133 mEq/L — ABNORMAL LOW (ref 135–145)

## 2012-10-22 MED ORDER — DEXTROSE 5 % IV SOLN
1.0000 g | Freq: Three times a day (TID) | INTRAVENOUS | Status: DC
  Administered 2012-10-22 – 2012-10-27 (×16): 1 g via INTRAVENOUS
  Filled 2012-10-22 (×18): qty 1

## 2012-10-22 NOTE — Progress Notes (Signed)
ANTIBIOTIC CONSULT NOTE - INITIAL  Pharmacy Consult for Ceftazidime Indication: rule out pneumonia  Allergies  Allergen Reactions  . Contrast Media [Iodinated Diagnostic Agents] Hives    Patient Measurements: Height: 5\' 1"  (154.9 cm) Weight: 113 lb 5.1 oz (51.4 kg) IBW/kg (Calculated) : 47.8  Vital Signs: Temp: 99.8 F (37.7 C) (10/22 0800) Temp src: Axillary (10/22 0800) BP: 140/86 mmHg (10/22 0800) Pulse Rate: 127 (10/22 0800) Intake/Output from previous day: 10/21 0701 - 10/22 0700 In: 3510 [I.V.:1800; NG/GT:1710] Out: 134 [Urine:2; Drains:132] Intake/Output from this shift: Total I/O In: 165 [I.V.:75; NG/GT:90] Out: 15 [Drains:15]  Labs:  Recent Labs  10/20/12 1020 10/21/12 0430 10/22/12 0338  WBC 12.4* 11.0* 11.6*  HGB 9.1* 9.6* 9.2*  PLT 102* 108* 124*  CREATININE  --  0.21* 0.21*   Estimated Creatinine Clearance: 81.8 ml/min (by C-G formula based on Cr of 0.21). No results found for this basename: VANCOTROUGH, VANCOPEAK, VANCORANDOM, GENTTROUGH, GENTPEAK, GENTRANDOM, TOBRATROUGH, TOBRAPEAK, TOBRARND, AMIKACINPEAK, AMIKACINTROU, AMIKACIN,  in the last 72 hours   Microbiology: Recent Results (from the past 720 hour(s))  CULTURE, BLOOD (ROUTINE X 2)     Status: None   Collection Time    09/22/12  5:28 PM      Result Value Range Status   Specimen Description BLOOD LEFT HAND   Final   Special Requests BOTTLES DRAWN AEROBIC ONLY 8CC   Final   Culture  Setup Time     Final   Value: 09/22/2012 21:57     Performed at [facility]   Culture     Final   Value: NO GROWTH 5 DAYS     Performed at [facility]   Report Status 09/28/2012 FINAL   Final  URINE CULTURE     Status: None   Collection Time    09/22/12  6:17 PM      Result Value Range Status   Specimen Description URINE, CATHETERIZED   Final   Special Requests NONE   Final   Culture  Setup Time     Final   Value: 09/22/2012 20:40     Performed at [facility] Count      Final   Value: >=100,000 COLONIES/ML     Performed at [facility]   Culture     Final   Value: ESCHERICHIA COLI     Performed at [facility]   Report Status 09/24/2012 FINAL   Final   Organism ID, Bacteria ESCHERICHIA COLI   Final  CULTURE, BLOOD (ROUTINE X 2)     Status: None   Collection Time    09/22/12  6:26 PM      Result Value Range Status   Specimen Description BLOOD LEFT ARM   Final   Special Requests BOTTLES DRAWN AEROBIC ONLY 10CC   Final   Culture  Setup Time     Final   Value: 09/22/2012 21:57     Performed at [facility]   Culture     Final   Value: NO GROWTH 5 DAYS     Performed at [facility]   Report Status 09/28/2012 FINAL   Final  CULTURE, RESPIRATORY (NON-EXPECTORATED)     Status: None   Collection Time    09/24/12  2:32 PM      Result Value Range Status   Specimen Description TRACHEAL ASPIRATE   Final   Special Requests NONE   Final   Gram Stain  Final   Value: FEW WBC PRESENT,BOTH PMN AND MONONUCLEAR     NO SQUAMOUS EPITHELIAL CELLS SEEN     NO ORGANISMS SEEN     Performed at [facility]   Culture     Final   Value: NO GROWTH 2 DAYS     Performed at [facility]   Report Status 09/26/2012 FINAL   Final  CLOSTRIDIUM DIFFICILE BY PCR     Status: None   Collection Time    09/25/12  9:00 AM      Result Value Range Status   C difficile by pcr NEGATIVE  NEGATIVE Final  URINE CULTURE     Status: None   Collection Time    10/08/12 11:35 AM      Result Value Range Status   Specimen Description URINE, RANDOM   Final   Special Requests NONE   Final   Culture  Setup Time     Final   Value: 10/08/2012 12:26     Performed at [facility] Count     Final   Value: >=100,000 COLONIES/ML     Performed at [facility]   Culture     Final   Value: ESCHERICHIA COLI     Performed at [facility]   Report Status 10/10/2012 FINAL   Final   Organism ID, Bacteria  ESCHERICHIA COLI   Final  CSF CULTURE     Status: None   Collection Time    10/20/12  9:26 AM      Result Value Range Status   Specimen Description CSF   Final   Special Requests 2.0ML FLUID   Final   Gram Stain     Final   Value: WBC PRESENT, PREDOMINANTLY PMN     NO ORGANISMS SEEN     Performed at [facility]     Performed at [facility]   Culture     Final   Value: NO GROWTH 1 DAY     Performed at [facility]   Report Status PENDING   Incomplete  GRAM STAIN     Status: None   Collection Time    10/20/12  9:26 AM      Result Value Range Status   Specimen Description CSF   Final   Special Requests 2.0ML FLUID   Final   Gram Stain     Final   Value: DIRECT SMEAR     ABUNDANT WBC PRESENT, PREDOMINANTLY PMN     NO ORGANISMS SEEN   Report Status 10/20/2012 FINAL   Final  CULTURE, BLOOD (ROUTINE X 2)     Status: None   Collection Time    10/20/12 10:25 AM      Result Value Range Status   Specimen Description BLOOD RIGHT ARM   Final   Special Requests BOTTLES DRAWN AEROBIC ONLY 5CC   Final   Culture  Setup Time     Final   Value: 10/20/2012 14:13     Performed at [facility]   Culture     Final   Value:        BLOOD CULTURE RECEIVED NO GROWTH TO DATE CULTURE WILL BE HELD FOR 5 DAYS BEFORE ISSUING A FINAL NEGATIVE REPORT     Performed at [facility]   Report Status PENDING   Incomplete  CULTURE, BLOOD (ROUTINE X 2)     Status: None   Collection Time  10/20/12 10:34 AM      Result Value Range Status   Specimen Description BLOOD RIGHT HAND   Final   Special Requests BOTTLES DRAWN AEROBIC ONLY 1.5CC   Final   Culture  Setup Time     Final   Value: 10/20/2012 14:13     Performed at [facility]   Culture     Final   Value:        BLOOD CULTURE RECEIVED NO GROWTH TO DATE CULTURE WILL BE HELD FOR 5 DAYS BEFORE ISSUING A FINAL NEGATIVE REPORT     Performed at [facility]   Report Status PENDING   Incomplete   CULTURE, RESPIRATORY (NON-EXPECTORATED)     Status: None   Collection Time    10/20/12 12:10 PM      Result Value Range Status   Specimen Description TRACHEAL ASPIRATE   Final   Special Requests NONE   Final   Gram Stain     Final   Value: MODERATE WBC PRESENT, PREDOMINANTLY PMN     NO SQUAMOUS EPITHELIAL CELLS SEEN     NO ORGANISMS SEEN     Performed at [facility]   Culture     Final   Value: FEW GRAM NEGATIVE RODS     Performed at [facility]   Report Status PENDING   Incomplete  URINE CULTURE     Status: None   Collection Time    10/20/12  3:59 PM      Result Value Range Status   Specimen Description URINE, CATHETERIZED   Final   Special Requests NONE   Final   Culture  Setup Time     Final   Value: 10/20/2012 16:30     Performed at [facility] Count     Final   Value: NO GROWTH     Performed at [facility]   Culture     Final   Value: NO GROWTH     Performed at [facility]   Report Status 10/21/2012 FINAL   Final    Medical History: Past Medical History  Diagnosis Date  . Heart murmur   . Asthma     Medications:  No prescriptions prior to admission    Admit Complaint: 27 y.o.  female  admitted 09/11/2012 after found difficult to arrouse and found to have SAH.  Pharmacy consulted to dose ceftizidime.  PMH: None.    Overnight Events: 10/22/2012  D#41 with recurrent hemorrhage.  Intermittently febrile over past 48h, WBC up  Assessment: Anticoagulation: VTE Prophylaxis; 10/21 dopplers with superfician thrombis in left basilic vein  Infectious Disease: Pneumonia Tm 101.4, WBC 11.6 Antibiotics: 10/22 Ceftaz  Fluconazole 9/29>>10/2 Vanc 9/22 >> (end) Zosyn 9/22 >> (end) Unasyn 9/12 >> 9/21 Cipro 10/10>>>10/11  Cefazolin 10/11 >> 10/16  Fortaz 10/22 >>    Cultures: 9/12 RCx - normal oral flora 9/22, & 10/8 urine - E coli (only R to amp) 10/20 urine Neg 10/20 TA, GNR  Cardiovascular:Loud heart  murmur (congenital). EF 55-60% (09/15/12). 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 Head CT (9/15): large right MCA infarct, stable hemorrhage areas. Head CT (9/17): acute intraventricular hemorrhage and ventricular dilation possibly d/t ventricular catheter removal, progression of cerebral edema with 3 mm midline shift to the left. S/p R-EVD  removal and L-frontal ventriculostomy placed on 9/17. HTS weaned off 9/18  CPOT:  RASS:  CAM ICU:   Nephrology/Urology/Electrolytes/Optho: CrClk 80 ml/min  PTA Medication Issues:  No Home Meds  Best Practices: VTE Prophylaxis:  SCDs   Goal of Therapy:  Renal adjustment of medications  Plan:  1. Ceftazidime 1 g IV q8h 2. Follow up SCr, UOP, cultures, clinical course and adjust as clinically indicated.  Thank you for allowing pharmacy to be a part of this patients care team.  Lovenia Kim Pharm.D., BCPS Clinical Pharmacist 10/22/2012 8:19 AM Pager: [phone] Phone: [phone]

## 2012-10-22 NOTE — Progress Notes (Signed)
PULMONARY  / CRITICAL CARE MEDICINE  Name: Sally Nelson MRN: [id number] DOB: 01-Jul-1985    ADMISSION DATE:  09/11/2012 CONSULTATION DATE:  09/11/2012  REFERRING MD :  Neurosurgery PRIMARY SERVICE: PCCM  CHIEF COMPLAINT:  Post op vent management  BRIEF PATIENT DESCRIPTION: 27 y/o with SAH s/p craniotomy and clipping of R MCA aneurysm for [facility] 9/12. PCCM continuing to follow for post op vent management.  SIGNIFICANT EVENTS: 9/12 Craniotomy, clipping of Rt MCA aneurysm 9/23 Trach, G tube 10/09 Start tx for UTI 10/10 VP shunt placed 10/13 Seizure, Aneurysm re-bleed, Rt frontal ventriculostomy placed 10/19 Recurrent fever 10/22 Start fortaz for HCAP  STUDIES:  9/12 Head CT >> Rt SDH, evolving Rt MCA CVA with 14 mm shift 9/15 Echo >> EF 60 to 65%, grade 2 diastolic dysfx, mod/severe MR, mod PS 9/25 Doppler legs >> no DVT 10/1 Head CT >> Increased hydrocephalus, persistent herniation of Rt frontal/temporal lobes, mild intraventricular hemorrhage 10/13 Head CT >> Re-hemorrhage of aneurysm, increased hydrocephalus 10/14 Cebral angiogram >> Recurrence of Rt M2 aneurysm, Rt M4 pseudoaneurysm 10/20 Head CT >> partial decompression of ventricles 10/21 Doppler legs >> no DVT 10/21 Doppler Arms >> Superficial thrombosis Lt basilic vein, no DVT  LINES / TUBES: ETT 9/12 >>9/23  L [place]  9/12 >>>10/9 Ventriculostomy 9/17 >>>10/9 G tube 9/23 >>  Tracheostomy 9/23 >> VP shunt 10/9 >>  R frontal ventric drain 10/13 >>   CULTURES: 9/22 Urine >>> E. Coli sensitive to Zosyn 10/08 Urine>>> E. Coli  10/20 Urine >> negative 10/20 CSF culture >> 10/20 Blood >> GNR >>   ANTIBIOTICS: Cipro 10/10>>>10/11 Cefazolin 10/11 >> 10/16 Fortaz 10/22 >>  SUBJECTIVE:   Intermittent fever.  VITAL SIGNS: Temp:  [99.4 F (37.4 C)-101.4 F (38.6 C)] 99.8 F (37.7 C) (10/22 0730) Pulse Rate:  [100-131] 120 (10/22 0730) Resp:  [13-33] 20 (10/22 0730) BP: (121-156)/(61-101) 156/100 mmHg (10/22  0730) SpO2:  [95 %-100 %] 99 % (10/22 0730) FiO2 (%):  [28 %] 28 % (10/22 0600) Trach collar 28%  INTAKE / OUTPUT: Intake/Output     10/21 0701 - 10/22 0700 10/22 0701 - 10/23 0700   I.V. (mL/kg) 1725 (33.6)    NG/GT 1650    Total Intake(mL/kg) 3375 (65.7)    Urine (mL/kg/hr) 2 (0)    Drains 107 (0.1)    Total Output 109     Net +3266          Urine Occurrence 4 x     PHYSICAL EXAMINATION: Gen: No distress Neuro: Not following commands HEENT: trach site with clear to yellow secretions PULM: scattered rhonchi CV: regular, tachycardic, 2/6 SM AB: soft, non tender, G tube site clean Ext: No edema  Labs: CBC Recent Labs     10/20/12  1020  10/21/12  0430  10/22/12  0338  WBC  12.4*  11.0*  11.6*  HGB  9.1*  9.6*  9.2*  HCT  25.7*  26.9*  26.1*  PLT  102*  108*  124*    BMET Recent Labs     10/21/12  0430  10/22/12  0338  NA  132*  133*  K  4.1  4.2  CL  95*  96  CO2  27  24  BUN  13  14  CREATININE  0.21*  0.21*  GLUCOSE  139*  132*    Electrolytes Recent Labs     10/21/12  0430  10/22/12  0338  CALCIUM  9.4  9.3  Liver Enzymes Recent Labs     10/21/12  0430  AST  51*  ALT  71*  ALKPHOS  110  BILITOT  0.3  ALBUMIN  3.0*   Glucose Recent Labs     10/20/12  0737  10/20/12  1549  10/21/12  0229  10/21/12  0745  10/21/12  1546  10/21/12  2351  GLUCAP  128*  124*  148*  123*  121*  106*    Imaging Ct Head Wo Contrast  10/20/2012   CLINICAL DATA:  Follow up intraventricular hemorrhage. Recent right middle cerebral artery aneurysm clipping.  EXAM: CT HEAD WITHOUT CONTRAST  TECHNIQUE: Contiguous axial images were obtained from the base of the skull through the vertex without intravenous contrast.  COMPARISON:  Head CT 10/13/2012 and 10/10/2012.  FINDINGS: There has been interval placement of a right frontal ventriculostomy, tip in the right frontal horn. The left frontal ventriculostomy catheter is unchanged. Hydrocephalus has mildly  improved. There is still extensive blood throughout the lateral and 3rd ventricles. There is no significant residual blood in the 4th ventricle. The hemorrhage within the right temporal lobe and surrounding vasogenic edema have not significantly changed. The degree of brain herniation through the craniotomy is unchanged. There is minimal residual left to right midline shift.  There is a new small subdural hematoma tracking along the right parietal lobe, interhemispheric falx and tentorium. This measures 7 mm maximally. Subdural blood tracks anteriorly along the floor of the right middle cranial fossa.  No acute calvarial findings are identified. The visualized paranasal sinuses, mastoid air cells and middle ears remain clear.  IMPRESSION: 1. Partial decompression of the ventricles following right frontal ventriculostomy. The volume of intraventricular hemorrhage has slightly improved. 2. No significant change in right temporal lobe hematoma, surrounding vasogenic edema and peripheral brain herniation through the right craniotomy. 3. New small right temporal parietal subdural hematoma.   Electronically Signed   By: Roxy Horseman M.D.   On: 10/20/2012 13:58   Dg Chest Port 1 View  10/22/2012   CLINICAL DATA:  Followup atelectasis.  EXAM: PORTABLE CHEST - 1 VIEW  COMPARISON:  10/20/2012  FINDINGS: Cardiac shadow is stable. Persistent right basilar changes are noted and appear to with increased no pneumothorax of sizable effusion is seen. The left lung remains clear. A tracheostomy tube in shunt catheter are again noted and stable. In the interval from the prior exam.  IMPRESSION: Increasing right basilar infiltrate.   Electronically Signed   By: Alcide Clever M.D.   On: 10/22/2012 07:19   Dg Chest Port 1 View  10/20/2012   CLINICAL DATA:  Intracranial hemorrhage.  EXAM: PORTABLE CHEST - 1 VIEW  COMPARISON:  Portable chest 10/07/2012.  FINDINGS: 1236 hr. Tracheostomy appears well positioned. A left  ventriculoperitoneal shunt is noted. There is residual patchy atelectasis at the right lung base. No consolidation or significant pleural effusion is identified. The heart size and mediastinal contours are stable.  IMPRESSION: Mild residual right basilar atelectasis. No new findings demonstrated.   Electronically Signed   By: Roxy Horseman M.D.   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 S/p tracheostomy. Hx of Asthma. 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 P:  -Continue labetalol PRN  HEMATOLOGIC A:   Anemia, thrombocytopenia of critical illness. P:  -f/u CBC -SCD for DVT prevention  INFECTIOUS A:  Aspiration PNA on admission >> resolved. E coli UTI >> completed Tx 10/16. New fever 10/19 with GNR in sputum, and progressive RLL infiltrate >> HCAP. P:   -start fortaz 10/22 -f/u sputum cx from 10/20  ENDOCRINE A:   Relative adrenal insufficiency >> resolved. Steroid induced hyperglycemia >>resolved.   P:   -f/u blood sugar on BMET   Coralyn Helling, MD [facility] Pulmonary/Critical Care 10/22/2012, 7:56 AM Pager:  [phone] After 3pm call: [phone]

## 2012-10-22 NOTE — Progress Notes (Signed)
Physical Therapy Treatment Patient Details Name: Sally Nelson MRN: [id number] DOB: [birth year] Today's Date: 10/22/2012 Time: 4098-1191 PT Time Calculation (min): 24 min  PT Assessment / Plan / Recommendation  History of Present Illness Pt with new aneurysm found on monday 10/13.   PT Comments   Pt with minimal response today.  Not tracking or following any simple one step directions.  Will continue to follow.    Follow Up Recommendations  LTACH;SNF     Does the patient have the potential to tolerate intense rehabilitation     Barriers to Discharge        Equipment Recommendations   (TBD)    Recommendations for Other Services    Frequency Min 3X/week   Progress towards PT Goals Progress towards PT goals: Progressing toward goals  Plan Current plan remains appropriate    Precautions / Restrictions Precautions Precautions: Fall Precaution Comments: no bone flap on R side, ventric.  RN clamped drain prior to mobility.   Restrictions Weight Bearing Restrictions: No   Pertinent Vitals/Pain Did not indicate pain.      Mobility  Bed Mobility Bed Mobility: Rolling Left;Scooting to HOB Rolling Left: 1: +2 Total assist Rolling Left: Patient Percentage: 0% Scooting to HOB: 1: +2 Total assist Scooting to [facility]: Patient Percentage: 0% Details for Bed Mobility Assistance: pt rolled on to L side with pt performing 0%.  pt's brief soiled so hygiene performed in L side lying.   Transfers Transfers: Not assessed Ambulation/Gait Ambulation/Gait Assistance: Not tested (comment) Stairs: No Wheelchair Mobility Wheelchair Mobility: No Modified Rankin (Stroke Patients Only) Pre-Morbid Rankin Score: No symptoms Modified Rankin: Severe disability    Exercises     PT Diagnosis:    PT Problem List:   PT Treatment Interventions:     PT Goals (current goals can now be found in the care plan section) Acute Rehab PT Goals Patient Stated Goal: unable to state Time For Goal  Achievement: 10/27/12 Potential to Achieve Goals: Good  Visit Information  Last PT Received On: 10/22/12 Assistance Needed: +2 History of Present Illness: Pt with new aneurysm found on monday 10/13.    Subjective Data  Patient Stated Goal: unable to state   Cognition  Cognition Arousal/Alertness: Lethargic Behavior During Therapy: Flat affect Overall Cognitive Status: Impaired/Different from baseline Area of Impairment: Orientation;Attention;Memory;Following commands;Safety/judgement;Awareness;Problem solving Current Attention Level: Focused Following Commands: Follows one step commands inconsistently General Comments: pt minimally following commands.  Only responding to pain stimuli on L side and reaching for tech's hand with pt's L hand.  pt not localing to sound.      Balance  Balance Balance Assessed: No  End of Session PT - End of Session Equipment Utilized During Treatment: Oxygen Activity Tolerance: Patient tolerated treatment well Patient left: in bed;with call bell/phone within reach;with family/visitor present Nurse Communication: Mobility status   GP     [name], [place] [phone] 10/22/2012, 12:27 PM

## 2012-10-22 NOTE — Progress Notes (Signed)
Pt seen and examined. No issues overnight.  EXAM: Temp:  [99.4 F (37.4 C)-101.4 F (38.6 C)] 99.8 F (37.7 C) (10/22 0730) Pulse Rate:  [100-131] 120 (10/22 0730) Resp:  [13-33] 20 (10/22 0730) BP: (121-156)/(61-101) 156/100 mmHg (10/22 0730) SpO2:  [95 %-100 %] 99 % (10/22 0730) FiO2 (%):  [28 %] 28 % (10/22 0600) Intake/Output     10/21 0701 - 10/22 0700 10/22 0701 - 10/23 0700   I.V. (mL/kg) 1725 (33.6)    NG/GT 1650    Total Intake(mL/kg) 3375 (65.7)    Urine (mL/kg/hr) 2 (0)    Drains 107 (0.1)    Total Output 109     Net +3266          Urine Occurrence 4 x     EVD in place, dark bloody CSF 107cc x 24hrs Eyes open, not tracking. Blink to threat Trached, on collar Some spontaneous movement RUE, no movement elsewhere  LABS: Lab Results  Component Value Date   CREATININE 0.21* 10/22/2012   BUN 14 10/22/2012   NA 133* 10/22/2012   K 4.2 10/22/2012   CL 96 10/22/2012   CO2 24 10/22/2012   Lab Results  Component Value Date   WBC 11.6* 10/22/2012   HGB 9.2* 10/22/2012   HCT 26.1* 10/22/2012   MCV 81.6 10/22/2012   PLT 124* 10/22/2012    IMAGING: BUE/BLE duplex without DVT. Superficial basilic vein thrombosis on left  IMPRESSION: - 27 y.o. female SAH d# 58 s/p recurrent hemorrhage - Neurologically unchanged  PLAN: - Cont supportive care with current SBP goals - Will cont to monitor CSF output and re-evaluate right VPS when blood begins to clear

## 2012-10-23 ENCOUNTER — Inpatient Hospital Stay (HOSPITAL_COMMUNITY): Payer: Medicaid Other

## 2012-10-23 DIAGNOSIS — J9819 Other pulmonary collapse: Secondary | ICD-10-CM

## 2012-10-23 DIAGNOSIS — J9600 Acute respiratory failure, unspecified whether with hypoxia or hypercapnia: Secondary | ICD-10-CM

## 2012-10-23 DIAGNOSIS — J9811 Atelectasis: Secondary | ICD-10-CM | POA: Diagnosis not present

## 2012-10-23 DIAGNOSIS — J189 Pneumonia, unspecified organism: Secondary | ICD-10-CM

## 2012-10-23 LAB — GLUCOSE, CAPILLARY: Glucose-Capillary: 140 mg/dL — ABNORMAL HIGH (ref 70–99)

## 2012-10-23 LAB — CBC
HCT: 29.1 % — ABNORMAL LOW (ref 36.0–46.0)
Hemoglobin: 10.4 g/dL — ABNORMAL LOW (ref 12.0–15.0)
MCH: 28.9 pg (ref 26.0–34.0)
MCV: 80.8 fL (ref 78.0–100.0)
RBC: 3.6 MIL/uL — ABNORMAL LOW (ref 3.87–5.11)

## 2012-10-23 LAB — BASIC METABOLIC PANEL
BUN: 11 mg/dL (ref 6–23)
CO2: 28 mEq/L (ref 19–32)
Chloride: 92 mEq/L — ABNORMAL LOW (ref 96–112)
Glucose, Bld: 142 mg/dL — ABNORMAL HIGH (ref 70–99)
Potassium: 3.8 mEq/L (ref 3.5–5.1)
Sodium: 131 mEq/L — ABNORMAL LOW (ref 135–145)

## 2012-10-23 LAB — CSF CULTURE W GRAM STAIN

## 2012-10-23 MED ORDER — ALBUTEROL SULFATE (5 MG/ML) 0.5% IN NEBU: 2.5000 mg | INHALATION_SOLUTION | RESPIRATORY_TRACT | Status: DC | PRN

## 2012-10-23 MED ORDER — ALBUTEROL SULFATE (5 MG/ML) 0.5% IN NEBU
2.5000 mg | INHALATION_SOLUTION | Freq: Four times a day (QID) | RESPIRATORY_TRACT | Status: DC
  Administered 2012-10-23 – 2012-10-25 (×7): 2.5 mg via RESPIRATORY_TRACT
  Filled 2012-10-23 (×8): qty 0.5

## 2012-10-23 MED ORDER — COLLAGENASE 250 UNIT/GM EX OINT
TOPICAL_OINTMENT | Freq: Every day | CUTANEOUS | Status: DC
  Administered 2012-10-23: 1 via TOPICAL
  Administered 2012-10-24 – 2012-10-27 (×4): via TOPICAL
  Filled 2012-10-23: qty 30

## 2012-10-23 NOTE — Consult Note (Addendum)
WOC wound consult note Reason for Consult: Consult requested for sacral wound.  Pt has been admitted since 9/11 and stage 2 wound was first noted on 9/27.  This has evolved into an unstageable wound at this time.  Pt has been on a Sport low-airloss bed during the ICU stay and has been critically ill with multiple systemic factors which can impair healing.  She is very emaciated with protruding sacral bone, immobile, frequently incontinent of urine, and went to the OR on 9/11 for 3 hours, again on 9/12 for 1 hour, and 10/9 for 2 hours. She also went to interventional radiology on 9/11, and had 3 CT scans in the same time frame; all of these procedures require prolonged period of time on a hard surface with minimal opportunity for repositioning. Wound origin is probably related to multiple trips to the OR and other procedures.  Wound type:Previous stage 2 wound has evolved into an unstageable wound Pressure Ulcer POA: No Measurement:1X1cm Wound bed:100% slough Drainage (amount, consistency, odor) small tan drainage, no odor Periwound: intact skin surrounding Dressing procedure/placement/frequency: Santyl for chemical debridement of non-viable tissue.  Discussed plan of care with husband at bedside. Camiya Vinal MSN, RN, CWOCN, CWCN-AP, CNS [phone]   

## 2012-10-23 NOTE — Clinical Social Work Note (Signed)
Clinical Social Worker continuing to follow patient and family for support and discharge planning needs.  Patient father remains at bedside and basic needs are continuing to be met at this time.  Patient father plans to go home for the weekend to get some extended rest and plans to return on Monday.  CSW at bedside while patient actively participating with OT - per RN, patient has made great improvements in the last 24 hours.  CSW continues to remain available for support and to assist with patient discharge needs once medically appropriate.  Macario Golds, [place] [phone]

## 2012-10-23 NOTE — Progress Notes (Signed)
Occupational Therapy Treatment Patient Details Name: Sally Nelson MRN: [id number] DOB: 10/28/1985 Today's Date: 10/23/2012 Time: 4098-1191 OT Time Calculation (min): 26 min  OT Assessment / Plan / Recommendation  History of present illness Pt with new aneurysm found on monday 10/13.   OT comments  Excellent session again today. Pt using R thumb to view pictures in [employer] by double clicking on pictures. Following commands to bring younker to mouth to self suction with mod A. Tracking well to right and to midline. When asked if seeing "1" or "2", pt holds up 1 finger. Pt closing eyes for "yes". Will continue to follow. Will try to get OOB early next week.  Follow Up Recommendations  LTACH    Barriers to Discharge       Equipment Recommendations  3 in 1 bedside comode;Tub/shower bench;Wheelchair (measurements OT);Wheelchair cushion (measurements OT);Hospital bed    Recommendations for Other Services Rehab consult  Frequency Min 3X/week   Progress towards OT Goals Progress towards OT goals: Progressing toward goals  Plan Discharge plan remains appropriate    Precautions / Restrictions Precautions Precautions: Fall Precaution Comments: no bone flap R side skull. ventri drain has to be clamped if moving Restrictions Weight Bearing Restrictions: No   Pertinent Vitals/Pain Vitals stable    ADL  Eating/Feeding: NPO Grooming: Maximal assistance;Other (comment) (bringing youker to mouth to self suction) ADL Comments: Using cell phone to engage pt in purposeful activities. Pt clicking and liking pictures on instagram. Making minimal facial grimaces    OT Diagnosis:    OT Problem List:   OT Treatment Interventions:     OT Goals(current goals can now be found in the care plan section) Acute Rehab OT Goals Patient Stated Goal: unable to state OT Goal Formulation: With family Time For Goal Achievement: 10/31/12 Potential to Achieve Goals: Fair ADL  Goals Pt Will Perform Grooming: with mod assist;sitting Additional ADL Goal #1: follow 1 step command with 50% accuracy Additional ADL Goal #2: Demonstrate purposeful movement RUE with tactile/vc  Additional ADL Goal #3: Demonstrate visual scanning to locate target in L visual  field with mod cuing. Additional ADL Goal #4: Family to complete PROM LUE with min A  Visit Information  Last OT Received On: 10/23/12 Assistance Needed: +2 History of Present Illness: Pt with new aneurysm found on monday 10/13.    Subjective Data      Prior Functioning       Cognition  Cognition Arousal/Alertness: Awake/alert Behavior During Therapy: Flat affect Overall Cognitive Status: Impaired/Different from baseline Following Commands: Follows one step commands consistently Awareness: Intellectual Problem Solving: Slow processing;Decreased initiation    Mobility       Exercises  Other Exercises Other Exercises: RUE AA/PROM. LUE PROM   Balance     End of Session OT - End of Session Activity Tolerance: Patient tolerated treatment well Patient left: in bed;with call bell/phone within reach;with family/visitor present  GO     Orit Sanville,[name] 10/23/2012, 11:57 AM Luisa Dago, OTR/L  [phone] 10/23/2012

## 2012-10-23 NOTE — Progress Notes (Signed)
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 PCCM continuing to follow for post op vent management.  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 9/15 Echo >> EF 60 to 65%, grade 2 diastolic dysfx, mod/severe MR, mod PS 9/25 Doppler legs >> no DVT 10/1 Head CT >> Increased hydrocephalus, persistent herniation of Rt frontal/temporal lobes, mild intraventricular hemorrhage 10/13 Head CT >> Re-hemorrhage of aneurysm, increased hydrocephalus 10/14 Cebral angiogram >> Recurrence of Rt M2 aneurysm, Rt M4 pseudoaneurysm 10/20 Head CT >> partial decompression of ventricles 10/21 Doppler legs >> no DVT 10/21 Doppler Arms >> Superficial thrombosis Lt basilic vein, no DVT  LINES / TUBES: ETT 9/12 >>9/23  L [place]  9/12 >>>10/9 Ventriculostomy 9/17 >>>10/9 G tube 9/23 >>  Tracheostomy 9/23 >> VP shunt 10/9 >>  R frontal ventric drain 10/13 >>   CULTURES: 9/22 Urine >>> E. Coli sensitive to Zosyn 10/08 Urine>>> E. Coli  10/20 Urine >> negative 10/20 CSF culture >> 10/20 Blood >>  10/20 Sputum >> Enterobacter asburiae  ANTIBIOTICS: Cipro 10/10>>>10/11 Cefazolin 10/11 >> 10/16 Elita Quick 10/22 >>  SUBJECTIVE:   Hypoxia this AM from mucus plug >> improved after suctioning.  VITAL SIGNS: Temp:  [98.6 F (37 C)-101.1 F (38.4 C)]  98.8 F (37.1 C) (10/23 0700) Pulse Rate:  [106-141] 123 (10/23 0800) Resp:  [14-44] 20 (10/23 0800) BP: (128-160)/(80-101) 138/89 mmHg (10/23 0800) SpO2:  [89 %-100 %] 98 % (10/23 0800) FiO2 (%):  [28 %] 28 % (10/23 0800) Trach collar 28%  INTAKE / OUTPUT: Intake/Output     10/22 0701 - 10/23 0700 10/23 0701 - 10/24 0700   I.V. (mL/kg) 1800 (35) 75 (1.5)   NG/GT 1290 60   IV Piggyback 200    Total Intake(mL/kg) 3290 (64) 135 (2.6)   Urine (mL/kg/hr)     Drains 151 (0.1) 1 (0)   Stool 1 (0)    Total Output 152 1   Net +3138 +134        Urine Occurrence 11 x    Stool Occurrence 3 x     PHYSICAL EXAMINATION: Gen: No distress Neuro: Not following commands HEENT: trach site with clear to yellow secretions PULM: coarse breath sounds b/l Rt > Lt, decreased breath sounds Rt base, no wheeze CV: regular, tachycardic, 2/6 SM AB: soft, non tender, G tube site clean Ext: No edema  Labs: CBC Recent Labs     10/21/12  0430  10/22/12  0338  10/23/12  0500  WBC  11.0*  11.6*  12.7*  HGB  9.6*  9.2*  10.4*  HCT  26.9*  26.1*  29.1*  PLT  108*  124*  144*    BMET Recent Labs     10/21/12  0430  10/22/12  0338  10/23/12  0500  NA  132*  133*  131*  K  4.1  4.2  3.8  CL  95*  96  92*  CO2  27  24  28   BUN  13  14  11   CREATININE  0.21*  0.21*  0.23*  GLUCOSE  139*  132*  142*    Electrolytes Recent Labs     10/21/12  0430  10/22/12  0338  10/23/12  0500  CALCIUM  9.4  9.3  9.7   Liver Enzymes Recent Labs     10/21/12  0430  AST  51*  ALT  71*  ALKPHOS  110  BILITOT  0.3  ALBUMIN  3.0*   Glucose Recent Labs     10/21/12  1546  10/21/12  2351  10/22/12  0745  10/22/12  1610  10/23/12  0032  10/23/12  0751  GLUCAP  121*  106*  128*  133*  140*  130*    Imaging Dg Chest Port 1 View  10/23/2012   CLINICAL DATA:  Right basilar infiltrate. Respiratory failure/aspiration pneumonia.  EXAM: PORTABLE CHEST - 1 VIEW  COMPARISON:  10/22/2012 and  10/20/2012.  FINDINGS: Tracheostomy and ventriculoperitoneal shunt appear unchanged. There is new right lower and middle lobe collapse with otherwise stable right basilar airspace disease. There is volume loss in the right hemithorax. The left lung is clear. No significant pleural effusion is identified.  IMPRESSION: New right middle and lower lobe collapse, likely secondary to mucous plugging superimposed on basilar pneumonia. No other significant changes.   Electronically Signed   By: Roxy Horseman M.D.   On: 10/23/2012 07:37   Dg Chest Port 1 View  10/22/2012   CLINICAL DATA:  Followup atelectasis.  EXAM: PORTABLE CHEST - 1 VIEW  COMPARISON:  10/20/2012  FINDINGS: Cardiac shadow is stable. Persistent right basilar changes are noted and appear to with increased no pneumothorax of sizable effusion is seen. The left lung remains clear. A tracheostomy tube in shunt catheter are again noted and stable. In the interval from the prior exam.  IMPRESSION: Increasing right basilar infiltrate.   Electronically Signed   By: Alcide Clever M.D.   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 Mucus plug with hypoxia 10/23. S/p tracheostomy. Hx of Asthma. 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 P:  -Continue labetalol PRN  HEMATOLOGIC A:   Anemia, thrombocytopenia of critical illness. P:  -f/u CBC -SCD for DVT prevention  INFECTIOUS A:  Aspiration PNA on admission >> resolved. E  coli UTI >> completed Tx 10/16. New fever 10/19 with Enterobacter in sputum, and progressive RLL infiltrate >> HCAP. P:   -Day 2 of fortaz  ENDOCRINE A:   Relative adrenal insufficiency >> resolved. Steroid induced hyperglycemia >>resolved.   P:   -f/u blood sugar on BMET  CC time 35 minutes  Coralyn Helling, MD [facility] Pulmonary/Critical Care 10/23/2012, 8:56 AM Pager:  [phone] After 3pm call: [phone]

## 2012-10-23 NOTE — Progress Notes (Signed)
OT TREATMENT NOTE  Pt with increased responses today. Pt tracking cell phone to view pictures and using R thumb to tap to enlarge pictures on command and using thumb to scan through pictures. When listening to music, appears that pt is trying to mouth words. Pt squeezed this therapists hand on command, closed eyes for "yes" and waved good bye.    10/22/12 1300  OT Visit Information  Last OT Received On 10/22/12  Assistance Needed +2  History of Present Illness Pt with new aneurysm found on monday 10/13.  OT Time Calculation  OT Start Time 1320  OT Stop Time 1345  OT Time Calculation (min) 25 min  Precautions  Precautions Fall  Precaution Comments no bone flap R side skull. ventri drain has to be clamped if moving  ADL  ADL Comments Focus of treatment on assessing ability to follow commands. purposeful movment  Cognition  Arousal/Alertness Awake/alert  Behavior During Therapy Flat affect  Overall Cognitive Status Impaired/Different from baseline  Following Commands Follows one step commands inconsistently  Awareness Intellectual  Problem Solving Slow processing;Decreased initiation  Other Exercises  Other Exercises RUE P/AAROM. LUE PROM  OT Assessment/Plan  OT Plan Discharge plan remains appropriate  OT Frequency Min 3X/week  Follow Up Recommendations LTACH  OT Equipment 3 in 1 bedside comode;Tub/shower bench;Wheelchair (measurements OT);Wheelchair cushion (measurements OT);Hospital bed  Acute Rehab OT Goals  Patient Stated Goal unable to state  OT Goal Formulation With family  Time For Goal Achievement 10/31/12  Potential to Achieve Goals Fair  ADL Goals  Pt Will Perform Grooming with mod assist;sitting  Additional ADL Goal #1 follow 1 step command with 50% accuracy  Additional ADL Goal #2 Demonstrate purposeful movement RUE with tactile/vc   Additional ADL Goal #3 Demonstrate visual scanning to locate target in L visual  field with mod cuing.  Additional ADL Goal #4 Family  to complete PROM LUE with min A  [facility], OTR/L  [phone] 10/22/2012

## 2012-10-23 NOTE — Progress Notes (Signed)
UR completed.  Pt is not a candidate for LTACH as recommended by therapies because she has Medicaid coverage and that is not accepted by any LTACH.  Carlyle Lipa, RN BSN [facility] CCM Trauma/Neuro ICU Case Manager [phone]

## 2012-10-23 NOTE — Progress Notes (Signed)
Pt seen and examined. No issues overnight.  EXAM: Temp:  [98.6 F (37 C)-101.1 F (38.4 C)] 98.8 F (37.1 C) (10/23 0700) Pulse Rate:  [106-141] 123 (10/23 0800) Resp:  [14-44] 20 (10/23 0800) BP: (128-160)/(80-101) 138/89 mmHg (10/23 0800) SpO2:  [89 %-100 %] 98 % (10/23 0800) FiO2 (%):  [28 %] 28 % (10/23 0743) Intake/Output     10/22 0701 - 10/23 0700 10/23 0701 - 10/24 0700   I.V. (mL/kg) 1725 (33.6)    NG/GT 1230    IV Piggyback 200    Total Intake(mL/kg) 3155 (61.4)    Urine (mL/kg/hr)     Drains 151 (0.1) 1 (0)   Stool 1 (0)    Total Output 152 1   Net +3003 -1        Urine Occurrence 11 x    Stool Occurrence 3 x     EVD in place, slightly clearer CSF. 151cc x 24 hrs Eyes open, not tracking reliably Moves RUE spont, not consistently FC. No sig movement elsewhere Right craniectomy flap less tense this am  LABS: Lab Results  Component Value Date   CREATININE 0.23* 10/23/2012   BUN 11 10/23/2012   NA 131* 10/23/2012   K 3.8 10/23/2012   CL 92* 10/23/2012   CO2 28 10/23/2012   Lab Results  Component Value Date   WBC 12.7* 10/23/2012   HGB 10.4* 10/23/2012   HCT 29.1* 10/23/2012   MCV 80.8 10/23/2012   PLT 144* 10/23/2012    IMAGING: CXR with new right middle/lower lobe collapse likely mucous plugging with underlying basilar pneumonia  IMPRESSION: - 27 y.o. female SAH d# 62 s/p re-bleed IVH - Neurologically unchanged - New HCAP  PLAN: - Ceftazidime initiated for likely RML/RLL pneumonia - Cont EVD at 10

## 2012-10-23 NOTE — Consult Note (Signed)
WOC wound consult note Reason for Consult: Consult requested for sacral wound.  Pt has been admitted since 9/11 and stage 2 wound was first noted on 9/27.  This has evolved into an unstageable wound at this time.  Pt has been on a Sport low-airloss bed during the ICU stay and has been critically ill with multiple systemic factors which can impair healing.  She is very emaciated with protruding sacral bone, immobile, frequently incontinent of urine, and went to the OR on 9/11 for 3 hours, again on 9/12 for 1 hour, and 10/9 for 2 hours. She also went to interventional radiology on 9/11, and had 3 CT scans in the same time frame; all of these procedures require prolonged period of time on a hard surface with minimal opportunity for repositioning. Wound origin is probably related to multiple trips to the OR and other procedures.  Wound type:Previous stage 2 wound has evolved into an unstageable wound Pressure Ulcer POA: No Measurement:1X1cm Wound bed:100% slough Drainage (amount, consistency, odor) small tan drainage, no odor Periwound: intact skin surrounding Dressing procedure/placement/frequency: Santyl for chemical debridement of non-viable tissue.  Discussed plan of care with husband at bedside. Cammie Mcgee MSN, RN, CWOCN, [place], CNS [phone]

## 2012-10-24 ENCOUNTER — Inpatient Hospital Stay (HOSPITAL_COMMUNITY): Payer: Medicaid Other

## 2012-10-24 LAB — CBC
HCT: 27.9 % — ABNORMAL LOW (ref 36.0–46.0)
Hemoglobin: 9.9 g/dL — ABNORMAL LOW (ref 12.0–15.0)
MCHC: 35.5 g/dL (ref 30.0–36.0)
MCV: 80.6 fL (ref 78.0–100.0)
RBC: 3.46 MIL/uL — ABNORMAL LOW (ref 3.87–5.11)
WBC: 15.3 10*3/uL — ABNORMAL HIGH (ref 4.0–10.5)

## 2012-10-24 LAB — GLUCOSE, CAPILLARY
Glucose-Capillary: 129 mg/dL — ABNORMAL HIGH (ref 70–99)
Glucose-Capillary: 135 mg/dL — ABNORMAL HIGH (ref 70–99)

## 2012-10-24 LAB — BASIC METABOLIC PANEL
BUN: 11 mg/dL (ref 6–23)
Chloride: 92 mEq/L — ABNORMAL LOW (ref 96–112)
GFR calc Af Amer: >90 mL/min (ref >90)
GFR calc non Af Amer: >90 mL/min (ref >90)
Potassium: 4.2 mEq/L (ref 3.5–5.1)
Sodium: 130 mEq/L — ABNORMAL LOW (ref 135–145)

## 2012-10-24 MED ORDER — CHLORHEXIDINE GLUCONATE 0.12 % MT SOLN
15.0000 mL | Freq: Two times a day (BID) | OROMUCOSAL | Status: DC
  Administered 2012-10-24 – 2012-10-27 (×7): 15 mL via OROMUCOSAL
  Filled 2012-10-24 (×7): qty 15

## 2012-10-24 MED ORDER — BIOTENE DRY MOUTH MT LIQD
15.0000 mL | Freq: Two times a day (BID) | OROMUCOSAL | Status: DC
  Administered 2012-10-24 – 2012-10-27 (×7): 15 mL via OROMUCOSAL

## 2012-10-24 MED ORDER — METOPROLOL TARTRATE 25 MG/10 ML ORAL SUSPENSION
12.5000 mg | Freq: Two times a day (BID) | ORAL | Status: DC
  Administered 2012-10-24 (×2): 12.5 mg
  Filled 2012-10-24 (×4): qty 5

## 2012-10-24 NOTE — Progress Notes (Signed)
Trach changed at this time per protocol. Sally Nelson was changed out to another  #4 shiley cuffless. Pt tolerated the procedure well with no complications noted. Placement confirmed by equal BBS and positive color change on the EtCO2 detector. Pt placed back on 28% ATC post procedure. RT will monitor.

## 2012-10-24 NOTE — Progress Notes (Signed)
Physical Therapy Treatment Patient Details Name: Sally Nelson MRN: [id number] DOB: 1985-07-09 Today's Date: 10/24/2012 Time: 1140-1200 PT Time Calculation (min): 20 min  PT Assessment / Plan / Recommendation  History of Present Illness Pt with RMCA aneurysm clipping and crani 9/11. 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  Pt with new aneurysm on 10/13/12 remains with IVC drain.    PT Comments   Pt with increase lethargy this session.  Per RN pt more alert and responses this AM.   Pt with minimal response to simple one step commands.  Pt able to track using visual stimuli 2/4 trails.  Decreased frequency due to slow process.    Follow Up Recommendations  SNF     Equipment Recommendations  Other (comment) (TBA)    Frequency Min 2X/week   Progress towards PT Goals Progress towards PT goals: Not progressing toward goals - comment (pt increase lethargy this session; decrease frequency)  Plan Discharge plan needs to be updated;Frequency needs to be updated    Precautions / Restrictions Precautions Precautions: Fall Precaution Comments: no bone flap R side skull. No orders to clam IVC drain therefore bed exercises only this session. Marland Kitchen  Restrictions Weight Bearing Restrictions: No   Pertinent Vitals/Pain Unable to rate    Mobility  Bed Mobility Bed Mobility: Not assessed Transfers Transfers: Not assessed Ambulation/Gait Ambulation/Gait Assistance: Not tested (comment)    Exercises General Exercises - Upper Extremity Shoulder Flexion: PROM;Both;10 reps;Supine Shoulder ABduction: PROM;Both;10 reps;Supine Elbow Flexion: PROM;Both;10 reps;Supine Elbow Extension: Supine;PROM;Both;10 reps General Exercises - Lower Extremity Ankle Circles/Pumps: PROM;Both;10 reps;Seated Heel Slides: PROM;Both;10 reps Hip ABduction/ADduction: PROM;Both;10 reps Other Exercises Other Exercises: Performed P1 flexion extesion PROM.   Other Exercises: Attempted to scan with cell phone as traget stimuli.  Pt able to fixate for ~1-2 seconds prior to closing eyes.    PT Diagnosis:    PT Problem List:   PT Treatment Interventions:     PT Goals (current goals can now be found in the care plan section) Acute Rehab PT Goals Patient Stated Goal: unable to state PT Goal Formulation: With patient Time For Goal Achievement: 10/27/12 Potential to Achieve Goals: Fair  Visit Information  Last PT Received On: 10/24/12 Assistance Needed: +2 History of Present Illness: Pt with RMCA aneurysm clipping and crani 9/11. 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  Pt with new aneurysm on 10/13/12 remains with IVC drain.     Subjective Data  Patient Stated Goal: unable to state   Cognition  Cognition Arousal/Alertness: Awake/alert Behavior During Therapy: Flat affect Overall Cognitive Status: Impaired/Different from baseline Following Commands: Follows one step commands inconsistently Awareness: Intellectual Problem Solving: Slow processing;Decreased initiation General Comments: Pt with increase fatigue this session compared to yesterday session with OT.  Pt with minimal response to thrumbs and did not squeeze hand on command.  Pt did interact with cell phone but nothing on command.     Balance     End of Session PT - End of Session Equipment Utilized During Treatment: Oxygen (trach) Activity Tolerance: Patient limited by lethargy Patient left: in bed;with call bell/phone within reach;with family/visitor present Nurse Communication: Mobility status   GP     Arnetia Bronk 10/24/2012, 12:15 PM  Jake Shark, PT DPT [phone]

## 2012-10-24 NOTE — Progress Notes (Signed)
Pt seen and examined. No issues overnight.   EXAM: Temp:  [98.2 F (36.8 C)-102.1 F (38.9 C)] 98.5 F (36.9 C) (10/24 0700) Pulse Rate:  [103-132] 131 (10/24 0755) Resp:  [15-71] 26 (10/24 0755) BP: (125-151)/(70-101) 143/95 mmHg (10/24 0755) SpO2:  [96 %-100 %] 100 % (10/24 0755) FiO2 (%):  [28 %] 28 % (10/24 0755) Intake/Output     10/23 0701 - 10/24 0700 10/24 0701 - 10/25 0700   I.V. (mL/kg) 1001.7 (19.5)    Other 120    NG/GT 1380    IV Piggyback 100    Total Intake(mL/kg) 2601.7 (50.6)    Drains 17    Stool 0    Total Output 17     Net +2584.7          Urine Occurrence 5 x    Stool Occurrence 3 x     EVD in place, bloody CSF. 17cc x 24hrs Awake, alert Intermittently tracks trached on collar Follows commands RUE, no movement elsewhere Wound c/d/i, craniectomy site is sunken  LABS: Lab Results  Component Value Date   CREATININE 0.22* 10/24/2012   BUN 11 10/24/2012   NA 130* 10/24/2012   K 4.2 10/24/2012   CL 92* 10/24/2012   CO2 28 10/24/2012   Lab Results  Component Value Date   WBC 15.3* 10/24/2012   HGB 9.9* 10/24/2012   HCT 27.9* 10/24/2012   MCV 80.6 10/24/2012   PLT 182 10/24/2012   IMPRESSION: - 27 y.o. female SAH d# 64 s/p rebleed IVH due to recurrent aneurysm - Neurologically ? Slightly improved, now FC  PLAN: - Cont supportive care. Will repeat CT this weekend and evaluate shunt - With some improvement, may consider treating recurrent aneurysm by possible parent vessel sacrifice

## 2012-10-24 NOTE — Progress Notes (Signed)
At 1400 when assessing the IVC for the 1400 hour I had still not received any output with the drain all day and for the first time I noticed that there was no pulsation in the chamber. Attempted Lowering the drain to 0cm of H2O and even removed off of the pole and dropped down to see if there was any flow. Was not able to get any drainage. Notified Dr Conchita Paris at the time in surgery and made aware. He came by and flushed the drain and did get 7cc out of bright red CSF. Leveled the drain back out at 10cm of H2O per the MD order and will continue to monitor for output.

## 2012-10-24 NOTE — Progress Notes (Signed)
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 PCCM continuing to follow for post op vent management.  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 9/15 Echo >> EF 60 to 65%, grade 2 diastolic dysfx, mod/severe MR, mod PS 9/25 Doppler legs >> no DVT 10/1 Head CT >> Increased hydrocephalus, persistent herniation of Rt frontal/temporal lobes, mild intraventricular hemorrhage 10/13 Head CT >> Re-hemorrhage of aneurysm, increased hydrocephalus 10/14 Cebral angiogram >> Recurrence of Rt M2 aneurysm, Rt M4 pseudoaneurysm 10/20 Head CT >> partial decompression of ventricles 10/21 Doppler legs >> no DVT 10/21 Doppler Arms >> Superficial thrombosis Lt basilic vein, no DVT  LINES / TUBES: ETT 9/12 >>9/23  L [place]  9/12 >>>10/9 Ventriculostomy 9/17 >>>10/9 G tube 9/23 >>  Tracheostomy 9/23 >> VP shunt 10/9 >>  R frontal ventric drain 10/13 >>   CULTURES: 9/22 Urine >>> E. Coli sensitive to Zosyn 10/08 Urine>>> E. Coli  10/20 Urine >> negative 10/20 CSF culture >> negative 10/20 Blood >>  10/20 Sputum >> Enterobacter asburiae  ANTIBIOTICS: Cipro 10/10>>>10/11 Cefazolin 10/11 >> 10/16 Elita Quick 10/22 >>  SUBJECTIVE:   Oxygenation better.  Still having intermittent fever.  Thick, yellow secretions with chest PT and  suctioning.  VITAL SIGNS: Temp:  [98.2 F (36.8 C)-102.1 F (38.9 C)] 98.5 F (36.9 C) (10/24 0700) Pulse Rate:  [103-132] 121 (10/24 0800) Resp:  [15-71] 20 (10/24 0800) BP: (125-151)/(70-101) 144/86 mmHg (10/24 0800) SpO2:  [96 %-100 %] 100 % (10/24 0800) FiO2 (%):  [28 %] 28 % (10/24 0800) Trach collar 28%  INTAKE / OUTPUT: Intake/Output     10/23 0701 - 10/24 0700 10/24 0701 - 10/25 0700   I.V. (mL/kg) 1041.7 (20.3) 40 (0.8)   Other 120    NG/GT 1440 60   IV Piggyback 100    Total Intake(mL/kg) 2701.7 (52.6) 100 (1.9)   Drains 17    Stool 0    Total Output 17     Net +2684.7 +100        Urine Occurrence 5 x    Stool Occurrence 3 x     PHYSICAL EXAMINATION: Gen: No distress Neuro: More alert, following simple commands HEENT: trach site with clear to yellow secretions PULM: coarse breath sounds b/l Rt > Lt, decreased breath sounds Rt base, no wheeze CV: regular, tachycardic, 2/6 SM AB: soft, non tender, G tube site clean Ext: No edema  Labs: CBC Recent Labs     10/22/12  0338  10/23/12  0500  10/24/12  0540  WBC  11.6*  12.7*  15.3*  HGB  9.2*  10.4*  9.9*  HCT  26.1*  29.1*  27.9*  PLT  124*  144*  182    BMET Recent Labs     10/22/12  0338  10/23/12  0500  10/24/12  0540  NA  133*  131*  130*  K  4.2  3.8  4.2  CL  96  92*  92*  CO2  24  28  28   BUN  14  11  11   CREATININE  0.21*  0.23*  0.22*  GLUCOSE  132*  142*  137*    Electrolytes Recent Labs     10/22/12  0338  10/23/12  0500  10/24/12  0540  CALCIUM  9.3  9.7  9.7    Recent Labs     10/22/12  1610  10/23/12  0032  10/23/12  0751  10/23/12  1547  10/23/12  2355  10/24/12  0745  GLUCAP  133*  140*  130*  125*  129*  154*    Imaging Dg Chest Port 1 View  10/24/2012   CLINICAL DATA:  Follow up pneumonia.  EXAM: PORTABLE CHEST - 1 VIEW  COMPARISON:  10/23/2012.  FINDINGS: 0538 hr. Tracheostomy and ventriculoperitoneal shunt appear unchanged. There is persistent right  basilar airspace disease with associated volume loss consistent with middle and lower lobe collapse. The left lung is clear. There is no pneumothorax. The heart size and mediastinal contours are stable.  IMPRESSION: Unchanged right basilar airspace disease/collapse.   Electronically Signed   By: Roxy Horseman M.D.   On: 10/24/2012 07:18   Dg Chest Port 1 View  10/23/2012   CLINICAL DATA:  Pneumonia and atelectasis.  EXAM: PORTABLE CHEST - 1 VIEW  COMPARISON:  October 23, 2012.  FINDINGS: Tracheostomy tube is in grossly good position. Left lung is clear. Persistent right basilar opacity is noted most consistent with pneumonia or atelectasis. No pneumothorax is noted.  IMPRESSION: Persistent right basilar opacity consistent with pneumonia or atelectasis.   Electronically Signed   By: Roque Lias M.D.   On: 10/23/2012 16:19   Dg Chest Port 1 View  10/23/2012   CLINICAL DATA:  Right basilar infiltrate. Respiratory failure/aspiration pneumonia.  EXAM: PORTABLE CHEST - 1 VIEW  COMPARISON:  10/22/2012 and 10/20/2012.  FINDINGS: Tracheostomy and ventriculoperitoneal shunt appear unchanged. There is new right lower and middle lobe collapse with otherwise stable right basilar airspace disease. There is volume loss in the right hemithorax. The left lung is clear. No significant pleural effusion is identified.  IMPRESSION: New right middle and lower lobe collapse, likely secondary to mucous plugging superimposed on basilar pneumonia. No other significant changes.   Electronically Signed   By: Roxy Horseman M.D.   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 Hydrocephalus. P: -IVC drain per neurosurgery -zocor as secondary measure to reduce risk of vasospasm -keppra for seizure prevention -foot drop boots -Plan f/u CT head over weekend  PULMONARY A:  Acute respiratory failure in setting of SAH with inability to protect  airway. Mucus plug with hypoxia 10/23 >> some improved on CXR 10/24 with chest PT. S/p tracheostomy. Hx of Asthma. 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 Sinus tachycardia. P:  -add low dose schedule lopressor 10/24, and adjust up as needed  RENAL A: Mild hyponatremia. P: -NS IV fluid -f/u BMET -monitor urine outpt, renal fx -even fluid balance  HEMATOLOGIC A:  Anemia, thrombocytopenia of critical illness. P:  -f/u CBC -SCD for DVT prevention  INFECTIOUS A:  Aspiration PNA on admission >> resolved. E coli UTI >> completed Tx 10/16. New fever 10/19 with Enterobacter in sputum, and progressive RLL infiltrate >> HCAP. P:   -Day 3 of fortaz  ENDOCRINE A:   Relative adrenal insufficiency >> resolved. Steroid induced hyperglycemia >>resolved.   P:   -f/u blood sugar on BMET  Summary: She has some improvement in neuro function >> ? If change over last weekend was related to developing PNA with fever.  She still has consolidation in RLL on CXR, but less amount of atelectasis.  Oxygenation improved with Abx, chest PT, and nebulizer therapy.  Will defer bronchoscopy for now.  Coralyn Helling, MD [facility] Pulmonary/Critical Care 10/24/2012, 8:50 AM Pager:  [phone] After 3pm call: [phone]

## 2012-10-25 ENCOUNTER — Inpatient Hospital Stay (HOSPITAL_COMMUNITY): Payer: Medicaid Other

## 2012-10-25 LAB — BASIC METABOLIC PANEL
Calcium: 9.6 mg/dL (ref 8.4–10.5)
Creatinine, Ser: 0.2 mg/dL — ABNORMAL LOW (ref 0.50–1.10)
GFR calc non Af Amer: >90 mL/min (ref >90)
Sodium: 131 mEq/L — ABNORMAL LOW (ref 135–145)

## 2012-10-25 LAB — CBC
HCT: 26.5 % — ABNORMAL LOW (ref 36.0–46.0)
Hemoglobin: 9.4 g/dL — ABNORMAL LOW (ref 12.0–15.0)
MCH: 29.2 pg (ref 26.0–34.0)
MCHC: 35.5 g/dL (ref 30.0–36.0)
MCV: 82.3 fL (ref 78.0–100.0)
Platelets: 203 10*3/uL (ref 150–400)
RBC: 3.22 MIL/uL — ABNORMAL LOW (ref 3.87–5.11)
RDW: 15.9 % — ABNORMAL HIGH (ref 11.5–15.5)
WBC: 13.4 10*3/uL — ABNORMAL HIGH (ref 4.0–10.5)

## 2012-10-25 LAB — GLUCOSE, CAPILLARY
Glucose-Capillary: 131 mg/dL — ABNORMAL HIGH (ref 70–99)
Glucose-Capillary: 127 mg/dL — ABNORMAL HIGH (ref 70–99)
Glucose-Capillary: 127 mg/dL — ABNORMAL HIGH (ref 70–99)

## 2012-10-25 MED ORDER — MIDAZOLAM HCL 2 MG/2ML IJ SOLN
INTRAMUSCULAR | Status: AC
  Administered 2012-10-25: 2 mg via INTRAVENOUS
  Filled 2012-10-25: qty 2

## 2012-10-25 MED ORDER — LEVETIRACETAM 100 MG/ML PO SOLN
1000.0000 mg | Freq: Two times a day (BID) | ORAL | Status: DC
  Administered 2012-10-25 – 2012-10-27 (×4): 1000 mg
  Filled 2012-10-25 (×6): qty 10

## 2012-10-25 MED ORDER — MORPHINE SULFATE 2 MG/ML IJ SOLN
2.0000 mg | Freq: Once | INTRAMUSCULAR | Status: AC
  Administered 2012-10-25: 2 mg via INTRAVENOUS

## 2012-10-25 MED ORDER — LEVALBUTEROL HCL 0.63 MG/3ML IN NEBU
INHALATION_SOLUTION | RESPIRATORY_TRACT | Status: AC
  Filled 2012-10-25: qty 3

## 2012-10-25 MED ORDER — LEVALBUTEROL HCL 0.63 MG/3ML IN NEBU: 0.6300 mg | INHALATION_SOLUTION | RESPIRATORY_TRACT | Status: DC | PRN

## 2012-10-25 MED ORDER — MORPHINE SULFATE 2 MG/ML IJ SOLN
INTRAMUSCULAR | Status: AC
  Administered 2012-10-25: 2 mg via INTRAVENOUS
  Filled 2012-10-25: qty 1

## 2012-10-25 MED ORDER — METOPROLOL TARTRATE 25 MG/10 ML ORAL SUSPENSION
25.0000 mg | Freq: Two times a day (BID) | ORAL | Status: DC
  Administered 2012-10-25 – 2012-10-27 (×5): 25 mg
  Filled 2012-10-25 (×6): qty 10

## 2012-10-25 MED ORDER — MIDAZOLAM HCL 2 MG/2ML IJ SOLN: 2.0000 mg | Freq: Once | INTRAMUSCULAR | Status: DC

## 2012-10-25 MED ORDER — MIDAZOLAM HCL 2 MG/2ML IJ SOLN
2.0000 mg | Freq: Once | INTRAMUSCULAR | Status: AC
  Administered 2012-10-25 (×2): 2 mg via INTRAVENOUS

## 2012-10-25 MED ORDER — MORPHINE SULFATE 2 MG/ML IJ SOLN
2.0000 mg | Freq: Once | INTRAMUSCULAR | Status: AC
  Administered 2012-10-25: 2 mg via INTRAVENOUS
  Filled 2012-10-25: qty 1

## 2012-10-25 MED ORDER — MIDAZOLAM HCL 2 MG/2ML IJ SOLN
INTRAMUSCULAR | Status: AC
  Administered 2012-10-25: 2 mg
  Filled 2012-10-25: qty 2

## 2012-10-25 MED ORDER — LEVALBUTEROL HCL 0.63 MG/3ML IN NEBU
0.6300 mg | INHALATION_SOLUTION | Freq: Four times a day (QID) | RESPIRATORY_TRACT | Status: DC
  Administered 2012-10-25 – 2012-10-27 (×9): 0.63 mg via RESPIRATORY_TRACT
  Filled 2012-10-25 (×13): qty 3

## 2012-10-25 NOTE — Brief Op Note (Signed)
PREOP DX: Hydrocephalus  POSTOP DX: Same  PROCEDURE: Attempted left frontal ventriculostomy   SURGEON: Dr. Lisbeth Renshaw, MD  ANESTHESIA: IV Sedation (versed and morphine) with Local  EBL: Minimal  SPECIMENS: None  COMPLICATIONS: None  CONDITION: Hemodynamically stable  INDICATIONS: [name] is a 27 y.o. female who earlier tonight underwent attempted left frontal external ventricular drain placement. CT scan was done which demonstrated the catheter to be lateral to the left lateral ventricle, and its tract appeared to be deflected in the intraparenchymal compartment. Also noted was a new acute left ventricular hemorrhage with casting.  I spoke in detail with the patient's father regarding the unsuccessful attempt and the CT findings. I explained to him that a repeat attempt does carry the risk of further ventricular and or parenchymal hemorrhage, however she is drainage dependent and requires CSF diversion eventually. He understood the difficult situation, and consented to repeat attempt at left frontal external ventricular drain placement.  PROCEDURE IN DETAIL: Skin of the left frontal scalp was clipped, prepped and draped in the usual sterile fashion.  Scalp was then infiltrated with local anesthetic with epinephrine.  The earlier skin incisions sutures were cut and the previous incision open. Self-retaining retractor was placed. The previous external ventricular drain catheter was then cut and removed, and a new catheter was passed with return of bloody CSF. Within seconds of placement, the catheter clotted without any further CSF flow. A catheter was then removed, flushed, and a second pass was made again with brisk flow of bloody CSF. Again, within minutes, the catheter clotted and no further spontaneous CSF flow was obtained. At this point, the procedure was aborted. The catheter was removed, and the skin closed using running nylon stitch. Sterile dressing was  applied.  FINDINGS: 1. 2 passes obtaining bloody CSF with rapid clotting of the catheter and no further spontaneous CSF flow.

## 2012-10-25 NOTE — Progress Notes (Signed)
PREOP DX: Hydrocephalus  POSTOP DX: Same  PROCEDURE: Attempted left frontal ventriculostomy   SURGEON: Dr. Lisbeth Renshaw, MD  ANESTHESIA: IV Sedation (versed and morphine) with Local  EBL: Minimal  SPECIMENS: None  COMPLICATIONS: None  CONDITION: Hemodynamically stable  INDICATIONS: [name] is a 27 y.o. female with a prolonged ICU stay and multiple intraventricular hemorrhages, was right frontal external ventricular drain was found to be nonfunctional. After removal, serial CT scans demonstrated progressive ventriculomegaly. Placement of a new ventricular drain was therefore indicated. The patient has previously undergone diagnostic cerebral angiogram which demonstrated the presence of a pseudoaneurysm on the distal M2 for vessels on the right side, adjacent to the previously placed catheter. Therefore, I prefer not to place a second catheter on the right side.  PROCEDURE IN DETAIL: After consent was obtained from the patient's father, skin of the left frontal scalp was clipped, prepped and draped in the usual sterile fashion.  Scalp was then infiltrated with local anesthetic with epinephrine.  The previous skin incision was then opened, and the previous placed shunt assembly was identified. This was then suture ligated and cut. The ventricular catheter was then removed and 2 attempts were made at placement of a a new ventricular catheter through the same burr hole which were both unsuccessful. A second post drill burr hole was then made lateral to the first, the dura was incised, and 2 more attempts were made at placement of a ventricular catheter unsuccessfully. The final catheter placement was then secured in place and the skin closed using a running nylon stitch. The patient was then sent for CT scan to determine catheter position.  FINDINGS: 1. unsuccessful attempt at left frontal external ventricular drain placement

## 2012-10-25 NOTE — Progress Notes (Signed)
Pt seen and examined. No issues overnight. EVD drain remained without output.  EXAM: Temp:  [97.8 F (36.6 C)-101.3 F (38.5 C)] 100 F (37.8 C) (10/25 0400) Pulse Rate:  [104-130] 112 (10/25 0803) Resp:  [15-29] 18 (10/25 0803) BP: (126-152)/(73-100) 144/83 mmHg (10/25 0803) SpO2:  [97 %-100 %] 100 % (10/25 0803) FiO2 (%):  [28 %] 28 % (10/25 0803) Intake/Output     10/24 0701 - 10/25 0700 10/25 0701 - 10/26 0700   I.V. (mL/kg) 840 (16.3)    Other     NG/GT 1260    IV Piggyback 150    Total Intake(mL/kg) 2250 (43.8)    Drains 11    Total Output 11     Net +2239          Urine Occurrence 4 x    Stool Occurrence 5 x     EVD with 0 output Awake, alert Trached, on collar Follows commands RUE, no movement elsewhere Left shunt valve pumps and refills briskly Wound c/d/i Flap is slightly sunken, not tense  LABS: Lab Results  Component Value Date   CREATININE 0.20* 10/25/2012   BUN 13 10/25/2012   NA 131* 10/25/2012   K 4.1 10/25/2012   CL 92* 10/25/2012   CO2 30 10/25/2012   Lab Results  Component Value Date   WBC 13.4* 10/25/2012   HGB 9.4* 10/25/2012   HCT 26.5* 10/25/2012   MCV 82.3 10/25/2012   PLT 203 10/25/2012    IMAGING: CTH yesterday shows evolution of IPH/IVH with the tip of the right frontal EVD now extraventricular  IMPRESSION: - 27 y.o. female s/p primarily ventricular aneurysm recurrence with rebleed - EVD non-functional x 24hrs with stable neurologic exam  PLAN: - right EVD removed at bedside - Monitor for neurologic change, repeat CTH this pm - May do shunt tap this am to evaluate left VPS patency.

## 2012-10-25 NOTE — Progress Notes (Signed)
I spoke with the patient's  father regarding the 2 unsuccessful attempts at placement of a left frontal ventricular drain this evening. I explained to him that at this point without ventricular drainage, she would likely continue to develop hydrocephalus, and that untreated HCP would eventually lead to death. I told him that at some point in the future, she may develop ventriculomegaly which allows catheter placement, with resolution of some of the ventricular hemorrhage. I also asked him to think about how aggressive we should be in these maneuvers. He stated he would be in the hospital tomorrow morning and that we can talk further at that time.

## 2012-10-25 NOTE — Progress Notes (Signed)
ANTIBIOTIC CONSULT NOTE - FOLLOW UP  Pharmacy Consult for [facility] Indication: pneumonia  Allergies  Allergen Reactions  . Contrast Media [Iodinated Diagnostic Agents] Hives    Patient Measurements: Height: 5\' 1"  (154.9 cm) Weight: 113 lb 5.1 oz (51.4 kg) IBW/kg (Calculated) : 47.8 Adjusted Body Weight: *  Vital Signs: Temp: 98.5 F (36.9 C) (10/25 1230) Temp src: Oral (10/25 1230) BP: 137/90 mmHg (10/25 1300) Pulse Rate: 110 (10/25 1300) Intake/Output from previous day: 10/24 0701 - 10/25 0700 In: 2500 [I.V.:920; NG/GT:1380; IV Piggyback:200] Out: 11 [Drains:11] Intake/Output from this shift: Total I/O In: 600 [I.V.:240; NG/GT:360] Out: -   Labs:  Recent Labs  10/23/12 0500 10/24/12 0540 10/25/12 0419  WBC 12.7* 15.3* 13.4*  HGB 10.4* 9.9* 9.4*  PLT 144* 182 203  CREATININE 0.23* 0.22* 0.20*   Estimated Creatinine Clearance: 81.8 ml/min (by C-G formula based on Cr of 0.2). No results found for this basename: VANCOTROUGH, Leodis Binet, VANCORANDOM, GENTTROUGH, GENTPEAK, GENTRANDOM, TOBRATROUGH, TOBRAPEAK, TOBRARND, AMIKACINPEAK, AMIKACINTROU, AMIKACIN,  in the last 72 hours   Microbiology: Recent Results (from the past 720 hour(s))  URINE CULTURE     Status: None   Collection Time    10/08/12 11:35 AM      Result Value Range Status   Specimen Description URINE, RANDOM   Final   Special Requests NONE   Final   Culture  Setup Time     Final   Value: 10/08/2012 12:26     Performed at [facility] Count     Final   Value: >=100,000 COLONIES/ML     Performed at [facility]   Culture     Final   Value: ESCHERICHIA COLI     Performed at [facility]   Report Status 10/10/2012 FINAL   Final   Organism ID, Bacteria ESCHERICHIA COLI   Final  CSF CULTURE     Status: None   Collection Time    10/20/12  9:26 AM      Result Value Range Status   Specimen Description CSF   Final   Special Requests 2.0ML FLUID   Final   Gram Stain      Final   Value: WBC PRESENT, PREDOMINANTLY PMN     NO ORGANISMS SEEN     Performed at [facility]     Performed at [facility]   Culture     Final   Value: NO GROWTH 3 DAYS     Performed at [facility]   Report Status 10/23/2012 FINAL   Final  GRAM STAIN     Status: None   Collection Time    10/20/12  9:26 AM      Result Value Range Status   Specimen Description CSF   Final   Special Requests 2.0ML FLUID   Final   Gram Stain     Final   Value: DIRECT SMEAR     ABUNDANT WBC PRESENT, PREDOMINANTLY PMN     NO ORGANISMS SEEN   Report Status 10/20/2012 FINAL   Final  CULTURE, BLOOD (ROUTINE X 2)     Status: None   Collection Time    10/20/12 10:25 AM      Result Value Range Status   Specimen Description BLOOD RIGHT ARM   Final   Special Requests BOTTLES DRAWN AEROBIC ONLY 5CC   Final   Culture  Setup Time     Final   Value: 10/20/2012 14:13  Performed at [facility]     Final   Value:        BLOOD CULTURE RECEIVED NO GROWTH TO DATE CULTURE WILL BE HELD FOR 5 DAYS BEFORE ISSUING A FINAL NEGATIVE REPORT     Performed at [facility]   Report Status PENDING   Incomplete  CULTURE, BLOOD (ROUTINE X 2)     Status: None   Collection Time    10/20/12 10:34 AM      Result Value Range Status   Specimen Description BLOOD RIGHT HAND   Final   Special Requests BOTTLES DRAWN AEROBIC ONLY 1.5CC   Final   Culture  Setup Time     Final   Value: 10/20/2012 14:13     Performed at [facility]   Culture     Final   Value:        BLOOD CULTURE RECEIVED NO GROWTH TO DATE CULTURE WILL BE HELD FOR 5 DAYS BEFORE ISSUING A FINAL NEGATIVE REPORT     Performed at [facility]   Report Status PENDING   Incomplete  CULTURE, RESPIRATORY (NON-EXPECTORATED)     Status: None   Collection Time    10/20/12 12:10 PM      Result Value Range Status   Specimen Description TRACHEAL ASPIRATE   Final   Special Requests NONE   Final   Gram  Stain     Final   Value: MODERATE WBC PRESENT, PREDOMINANTLY PMN     NO SQUAMOUS EPITHELIAL CELLS SEEN     NO ORGANISMS SEEN     Performed at [facility]   Culture     Final   Value: FEW ENTEROBACTER ASBURIAE     Performed at [facility]   Report Status 10/22/2012 FINAL   Final   Organism ID, Bacteria ENTEROBACTER ASBURIAE   Final  URINE CULTURE     Status: None   Collection Time    10/20/12  3:59 PM      Result Value Range Status   Specimen Description URINE, CATHETERIZED   Final   Special Requests NONE   Final   Culture  Setup Time     Final   Value: 10/20/2012 16:30     Performed at [facility] Count     Final   Value: NO GROWTH     Performed at [facility]   Culture     Final   Value: NO GROWTH     Performed at [facility]   Report Status 10/21/2012 FINAL   Final    Anti-infectives   Start     Dose/Rate Route Frequency Ordered Stop   10/22/12 0900  cefTAZidime (FORTAZ) 1 g in dextrose 5 % 50 mL IVPB     1 g 100 mL/hr over 30 Minutes Intravenous 3 times per day 10/22/12 0833     10/11/12 1500  ceFAZolin (ANCEF) IVPB 2 g/50 mL premix  Status:  Discontinued     2 g 100 mL/hr over 30 Minutes Intravenous Every 8 hours 10/11/12 1331 10/16/12 1032   10/10/12 1000  ciprofloxacin (CIPRO) IVPB 400 mg  Status:  Discontinued     400 mg 200 mL/hr over 60 Minutes Intravenous 2 times daily 10/10/12 0904 10/12/12 0722   10/09/12 1643  bacitracin 50,000 Units in sodium chloride irrigation 0.9 % 500 mL irrigation  Status:  Discontinued       As needed 10/09/12 1643  10/09/12 1756   10/09/12 1623  ceFAZolin (ANCEF) 1-5 GM-% IVPB    Comments:  Otho Perl   : cabinet override      10/09/12 1623 10/10/12 0429   09/29/12 1115  fluconazole (DIFLUCAN) 40 MG/ML suspension 200 mg     200 mg Per Tube Daily 09/29/12 1005 10/01/12 1100   09/27/12 0000  vancomycin (VANCOCIN) 1,250 mg in sodium chloride 0.9 % 250 mL IVPB  Status:  Discontinued      1,250 mg 166.7 mL/hr over 90 Minutes Intravenous Every 8 hours 09/26/12 2050 09/30/12 0949   09/25/12 1800  vancomycin (VANCOCIN) IVPB 1000 mg/200 mL premix  Status:  Discontinued     1,000 mg 200 mL/hr over 60 Minutes Intravenous Every 8 hours 09/25/12 1319 09/26/12 2050   09/23/12 0200  vancomycin (VANCOCIN) 500 mg in sodium chloride 0.9 % 100 mL IVPB  Status:  Discontinued     500 mg 100 mL/hr over 60 Minutes Intravenous Every 8 hours 09/22/12 1743 09/25/12 1319   09/22/12 1800  piperacillin-tazobactam (ZOSYN) IVPB 3.375 g  Status:  Discontinued     3.375 g 12.5 mL/hr over 240 Minutes Intravenous Every 8 hours 09/22/12 1744 09/30/12 0949   09/22/12 1745  vancomycin (VANCOCIN) IVPB 750 mg/150 ml premix     750 mg 150 mL/hr over 60 Minutes Intravenous  Once 09/22/12 1743 09/22/12 1907   09/12/12 1053  bacitracin 50,000 Units in sodium chloride irrigation 0.9 % 500 mL irrigation  Status:  Discontinued       As needed 09/12/12 1054 09/12/12 1212   09/12/12 0800  Ampicillin-Sulbactam (UNASYN) 3 g in sodium chloride 0.9 % 100 mL IVPB  Status:  Discontinued     3 g 100 mL/hr over 60 Minutes Intravenous Every 8 hours 09/12/12 0152 09/12/12 0746   09/12/12 0800  Ampicillin-Sulbactam (UNASYN) 3 g in sodium chloride 0.9 % 100 mL IVPB  Status:  Discontinued     3 g 100 mL/hr over 60 Minutes Intravenous 4 times per day 09/12/12 0746 09/21/12 1155   09/12/12 0200  Ampicillin-Sulbactam (UNASYN) 3 g in sodium chloride 0.9 % 100 mL IVPB     3 g 100 mL/hr over 60 Minutes Intravenous  Once 09/12/12 0152 09/12/12 0336   09/11/12 2308  ceFAZolin (ANCEF) 1-5 GM-% IVPB    Comments:  Aydelette, Jamie   : cabinet override      09/11/12 2308 09/12/12 1114   09/11/12 1945  bacitracin 50,000 Units in sodium chloride irrigation 0.9 % 500 mL irrigation  Status:  Discontinued       As needed 09/11/12 2050 09/12/12 0030   09/11/12 1923  ceFAZolin (ANCEF) 2-3 GM-% IVPB SOLR    Comments:  Toney Sang   :  cabinet override      09/11/12 1923 09/11/12 2310      Assessment: 27 y.o. 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  Infectious Disease: Tm 101.03, WBC 13.4. Scr 0.2 with CrCl 82. HCAP with enterobacter in sputum. Fluconazole 9/29>>10/2 Vanc 9/22 >> (end) Zosyn 9/22 >> (end) Unasyn 9/12 >> 9/21 Cipro 10/10>>>10/11  Cefazolin 10/11 >> 10/16  Fortaz 10/22 >>  9/12 RCx - normal oral flora 9/22, & 10/8 urine - E coli (only R to amp) 10/20 urine Neg 10/20 TA - enterobacter sens to fortaz 10/20 CSF- neg    Plan:  1. Ceftazidime 1  g IV q8h 2. Follow up SCr, UOP, cultures, clinical course and adjust as clinically indicated.   Alexa Blish S. Merilynn Finland, PharmD, BCPS Clinical Staff Pharmacist Pager [phone]  Misty Stanley Stillinger 10/25/2012,2:08 PM

## 2012-10-25 NOTE — Progress Notes (Signed)
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 9/12. PCCM continuing to follow for post op vent management.  SIGNIFICANT EVENTS: 9/12 Craniotomy, clipping of Rt MCA aneurysm 9/23 Trach, G tube 10/09 Start tx for UTI 10/10 VP shunt placed 10/13 Seizure, Aneurysm re-bleed, Rt frontal ventriculostomy placed 10/19 Recurrent fever 10/22 Start fortaz for HCAP 10/23 Hypoxia from mucus plug >> improved with suctioning by RT, start chest PT 10/24 Add lopressor per tube for HTN, tachycardia  STUDIES:  9/12 Head CT >> Rt SDH, evolving Rt MCA CVA with 14 mm shift 9/15 Echo >> EF 60 to 65%, grade 2 diastolic dysfx, mod/severe MR, mod PS 9/25 Doppler legs >> no DVT 10/1 Head CT >> Increased hydrocephalus, persistent herniation of Rt frontal/temporal lobes, mild intraventricular hemorrhage 10/13 Head CT >> Re-hemorrhage of aneurysm, increased hydrocephalus 10/14 Cebral angiogram >> Recurrence of Rt M2 aneurysm, Rt M4 pseudoaneurysm 10/20 Head CT >> partial decompression of ventricles 10/21 Doppler legs >> no DVT 10/21 Doppler Arms >> Superficial thrombosis Lt basilic vein, no DVT 10/24 CT head >> Rt hemispheric encephalomalacia with herniation  through craniectomy site, Rt frontal ventriculostomy catheter appears to stop short of the ventricle, persistent hydrocephalus, stable SDH and Lt frontal ventriculostomy catheter.   LINES / TUBES: ETT 9/12 >>9/23  L [place]  9/12 >>>10/9 Ventriculostomy 9/17 >>>10/9 G tube 9/23 >>  Tracheostomy 9/23 >> VP shunt 10/9 >>  R frontal ventric drain 10/13 >>   CULTURES: 9/22 Urine >>> E. Coli sensitive to Zosyn 10/08 Urine>>> E. Coli  10/20 Urine >>  negative 10/20 CSF culture >> negative 10/20 Blood >>  10/20 Sputum >> Enterobacter asburiae  ANTIBIOTICS: Cipro 10/10>>>10/11 Cefazolin 10/11 >> 10/16 Elita Quick 10/22 >>  SUBJECTIVE:   More alert.  HR improved.  Oxygenation better.  VITAL SIGNS: Temp:  [97.8 F (36.6 C)-101.3 F (38.5 C)] 100 F (37.8 C) (10/25 0400) Pulse Rate:  [104-131] 111 (10/25 0700) Resp:  [15-29] 16 (10/25 0700) BP: (126-152)/(73-100) 150/95 mmHg (10/25 0700) SpO2:  [97 %-100 %] 100 % (10/25 0700) FiO2 (%):  [28 %] 28 % (10/25 0343) Trach collar 28%  INTAKE / OUTPUT: Intake/Output     10/24 0701 - 10/25 0700 10/25 0701 - 10/26 0700   I.V. (mL/kg) 840 (16.3)    Other     NG/GT 1260    IV Piggyback 150    Total Intake(mL/kg) 2250 (43.8)    Drains 11    Total Output 11     Net +2239          Urine Occurrence 4 x    Stool Occurrence 5 x     PHYSICAL EXAMINATION: Gen: No distress Neuro: More alert, following simple commands HEENT: trach site clean PULM: coarse breath sounds, no wheeze CV: regular, tachycardic, 2/6 SM AB: soft, non tender, G tube site clean Ext: No edema  Labs: CBC Recent Labs     10/23/12  0500  10/24/12  0540  10/25/12  0419  WBC  12.7*  15.3*  13.4*  HGB  10.4*  9.9*  9.4*  HCT  29.1*  27.9*  26.5*  PLT  144*  182  203    BMET Recent Labs  10/23/12  0500  10/24/12  0540  10/25/12  0419  NA  131*  130*  131*  K  3.8  4.2  4.1  CL  92*  92*  92*  CO2  28  28  30   BUN  11  11  13   CREATININE  0.23*  0.22*  0.20*  GLUCOSE  142*  137*  135*    Electrolytes Recent Labs     10/23/12  0500  10/24/12  0540  10/25/12  0419  CALCIUM  9.7  9.7  9.6    Recent Labs     10/23/12  0751  10/23/12  1547  10/23/12  2355  10/24/12  0745  10/24/12  1556  10/24/12  2327  GLUCAP  130*  125*  129*  154*  135*  131*    Imaging Ct Head Wo Contrast  10/24/2012   CLINICAL DATA:  Evaluate intraventricular and drainage. Ruptured aneurysm.  EXAM: CT HEAD  WITHOUT CONTRAST  TECHNIQUE: Contiguous axial images were obtained from the base of the skull through the vertex without intravenous contrast.  COMPARISON:  CT head without contrast 10/20/2012.  FINDINGS: Diffuse intraventricular and parenchymal hemorrhage are again noted. The tip of the right frontal ventriculostomy catheter is not clearly within the ventricle. Hydrocephalus is suspected. The left frontal ventriculostomy catheter is stable. Extensive right-sided encephalomalacia and edema is present. The patient is status post craniectomy with persistent brain tissue herniating through the defect.  Anterior interventricular blood products have decreased in density. There is persistent high-density blood in the posterior right lateral ventricle and parenchyma. Extensive subdural blood persists along the tentorium and adjacent to the falx and lateral frontoparietal lobe. The density is decreasing.  IMPRESSION: 1. Extensive right hemispheric encephalomalacia with herniation through the craniectomy site. 2. The right frontal ventriculostomy catheter appears to stop short of the ventricle. 3. Persistent hydrocephalus with intraventricular blood products. No new hemorrhage is evident. 4. Stable diffuse subdural blood. 5. Stable left frontal ventriculostomy catheter.  These results were called by telephone at the time of interpretation on 10/24/2012 at 7:40 PM to Dr. Lisbeth Renshaw , who verbally acknowledged these results.   Electronically Signed   By: Gennette Pac M.D.   On: 10/24/2012 19:40   Dg Chest Port 1 View  10/25/2012   CLINICAL DATA:  Pneumonia  EXAM: PORTABLE CHEST - 1 VIEW  COMPARISON:  October 24, 2012  FINDINGS: The tracheostomy is well seated. There is a shunt catheter extending along the left hemi thorax crossing toward the right at the level of the diaphragm. No pneumothorax.  There is patchy infiltrate in the right base with slight clearing compared to 1 day prior. Lungs elsewhere are clear.  Heart size and pulmonary vascularity are normal. No adenopathy.  IMPRESSION: Partial but incomplete clearing of infiltrate from right lower lobe. No new opacity. No pneumothorax.   Electronically Signed   By: Bretta Bang M.D.   On: 10/25/2012 07:40   Dg Chest Port 1 View  10/24/2012   CLINICAL DATA:  Follow up pneumonia.  EXAM: PORTABLE CHEST - 1 VIEW  COMPARISON:  10/23/2012.  FINDINGS: 0538 hr. Tracheostomy and ventriculoperitoneal shunt appear unchanged. There is persistent right basilar airspace disease with associated volume loss consistent with middle and lower lobe collapse. The left lung is clear. There is no pneumothorax. The heart size and mediastinal contours are stable.  IMPRESSION: Unchanged right basilar airspace disease/collapse.   Electronically Signed   By: Sandi Mariscal.D.  On: 10/24/2012 07:18   Dg Chest Port 1 View  10/23/2012   CLINICAL DATA:  Pneumonia and atelectasis.  EXAM: PORTABLE CHEST - 1 VIEW  COMPARISON:  October 23, 2012.  FINDINGS: Tracheostomy tube is in grossly good position. Left lung is clear. Persistent right basilar opacity is noted most consistent with pneumonia or atelectasis. No pneumothorax is noted.  IMPRESSION: Persistent right basilar opacity consistent with pneumonia or atelectasis.   Electronically Signed   By: Roque Lias M.D.   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 Hydrocephalus. P: -IVC drain per neurosurgery -zocor as secondary measure to reduce risk of vasospasm -keppra for seizure prevention -foot drop boots  PULMONARY A:  Acute respiratory failure in setting of SAH with inability to protect airway. Mucus plug with hypoxia 10/23 >> continues to improve on CXR 10/25 with chest PT. S/p tracheostomy. Hx of Asthma. 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 Sinus tachycardia. P:  -increase lopressor to 25 mg bid per tube  RENAL A: Mild hyponatremia >> stable. P: -NS IV fluid -f/u BMET -monitor urine outpt, renal fx -even fluid balance  HEMATOLOGIC A:   Anemia, thrombocytopenia of critical illness. P:  -f/u CBC intermittently -SCD for DVT prevention  INFECTIOUS A:  Aspiration PNA on admission >> resolved. E coli UTI >> completed Tx 10/16. New fever 10/19 with Enterobacter in sputum, and progressive RLL infiltrate >> HCAP. P:   -Day 4 of fortaz  ENDOCRINE A:   Relative adrenal insufficiency >> resolved. Steroid induced hyperglycemia >>resolved.   P:   -f/u blood sugar on BMET  Summary: She continues to have improved respiratory status with chest PT, and Abx for HCAP.  PCCM will follow up again on Monday, 10/27 >> call if help needed sooner.  Coralyn Helling, MD [facility] Pulmonary/Critical Care 10/25/2012, 7:49 AM Pager:  [phone] After 3pm call: [phone]

## 2012-10-26 LAB — BASIC METABOLIC PANEL
BUN: 13 mg/dL (ref 6–23)
CO2: 28 mEq/L (ref 19–32)
Calcium: 9.9 mg/dL (ref 8.4–10.5)
Chloride: 87 mEq/L — ABNORMAL LOW (ref 96–112)
Creatinine, Ser: <0.2 mg/dL — ABNORMAL LOW (ref 0.50–1.10)
Potassium: 4.1 mEq/L (ref 3.5–5.1)
Sodium: 126 mEq/L — ABNORMAL LOW (ref 135–145)

## 2012-10-26 LAB — CULTURE, BLOOD (ROUTINE X 2): Culture: NO GROWTH

## 2012-10-26 LAB — GLUCOSE, CAPILLARY
Glucose-Capillary: 159 mg/dL — ABNORMAL HIGH (ref 70–99)
Glucose-Capillary: 163 mg/dL — ABNORMAL HIGH (ref 70–99)

## 2012-10-26 NOTE — Progress Notes (Signed)
Did not perform CPT.  Patient DNR, waiting on Pallative consult.  Continue to make patient comfortable as possible.

## 2012-10-26 NOTE — Progress Notes (Signed)
Paged MD to notify of pt status changed.  Dr. Conchita Paris called back and was notified that pt is not withdrawing to painful stimuli and pupils are non-reactive.  No new orders given at this time.

## 2012-10-26 NOTE — Progress Notes (Signed)
SLP Cancellation Note  Patient Details Name: Sally Nelson MRN: [id number] DOB: 27-Feb-1985   Cancelled treatment:  Received orders for ST evaluation.  Unable to complete due to change in medical status. ST to follow for POC.  Moreen Fowler MS, CCC-SLP [phone] [facility] 10/26/2012, 12:28 PM

## 2012-10-26 NOTE — Progress Notes (Signed)
Pt seen and examined. After last EVD attempt last night, pt has been unresponsive to verbal or noxious stimuli. No spontaneous movements seen.  EXAM: Temp:  [98.5 F (36.9 C)-98.7 F (37.1 C)] 98.6 F (37 C) (10/26 0300) Pulse Rate:  [73-125] 86 (10/26 0856) Resp:  [12-25] 20 (10/26 0856) BP: (115-165)/(65-112) 153/100 mmHg (10/26 0856) SpO2:  [95 %-100 %] 98 % (10/26 0856) FiO2 (%):  [28 %] 28 % (10/26 0856) Intake/Output     10/25 0701 - 10/26 0700 10/26 0701 - 10/27 0700   I.V. (mL/kg) 960 (18.7)    NG/GT 1440    IV Piggyback 150    Total Intake(mL/kg) 2550 (49.6)    Urine (mL/kg/hr) 4 (0)    Drains     Stool 1 (0)    Total Output 5     Net +2545          Urine Occurrence 4 x    Stool Occurrence 3 x     Eyes open, not tracking Pupils ~22mm, non-reactive (+) doll's eyes Trached, on trach collar No movement to central pain Right craniectomy site tense  LABS: Lab Results  Component Value Date   CREATININE <0.20* 10/26/2012   BUN 13 10/26/2012   NA 126* 10/26/2012   K 4.1 10/26/2012   CL 87* 10/26/2012   CO2 28 10/26/2012   Lab Results  Component Value Date   WBC 13.4* 10/25/2012   HGB 9.4* 10/25/2012   HCT 26.5* 10/25/2012   MCV 82.3 10/25/2012   PLT 203 10/25/2012    IMPRESSION: - 27 y.o. female s/p multiple hemorrhages most recently yesterday after failed EVD attempts - Doing very poorly with poor prognosis given recent events and current neurologic exam.  PLAN: - Change code status to DNR - Will not re-attempt EVD - Consult Palliative care  I spoke at length with the patient's father and her uncle and aunt this morning regarding the events of the last few days including last night and her current neurologic state. I told them that the "best" we have seen her in the last several weeks has been to move her fingers intermittently to command. Currently, she is completely unresponsive. In light of this, I told them I do not believe there is a signficant  chance for meaningful recovery and that even if she is able to eventually leave the hospital she would likely be bedridden, requiring 24hr care for basic functions. I also explained to them that if we do not attempt CSF drainage, she would likely succumb from increased ICP. After a lengthy discussion, the patient's father did not wish to proceed with further invasive interventions and wanted to change the patient's code status to DNR.

## 2012-10-27 ENCOUNTER — Inpatient Hospital Stay (HOSPITAL_COMMUNITY): Payer: Medicaid Other

## 2012-10-27 DIAGNOSIS — R627 Adult failure to thrive: Secondary | ICD-10-CM

## 2012-10-27 DIAGNOSIS — R4182 Altered mental status, unspecified: Secondary | ICD-10-CM

## 2012-10-27 DIAGNOSIS — Z515 Encounter for palliative care: Secondary | ICD-10-CM

## 2012-10-27 LAB — GLUCOSE, CAPILLARY: Glucose-Capillary: 159 mg/dL — ABNORMAL HIGH (ref 70–99)

## 2012-10-27 MED ORDER — COLLAGENASE 250 UNIT/GM EX OINT: TOPICAL_OINTMENT | Freq: Every day | CUTANEOUS | Status: AC

## 2012-10-27 MED ORDER — LEVETIRACETAM 100 MG/ML PO SOLN: 1000.0000 mg | Freq: Two times a day (BID) | ORAL | Status: AC

## 2012-10-27 MED ORDER — MORPHINE SULFATE 2 MG/ML IJ SOLN: 1.0000 mg | INTRAMUSCULAR | Status: DC | PRN

## 2012-10-27 MED ORDER — ARTIFICIAL TEARS OP OINT: TOPICAL_OINTMENT | OPHTHALMIC | Status: AC | PRN

## 2012-10-27 MED ORDER — CHLORHEXIDINE GLUCONATE 0.12 % MT SOLN: 15.0000 mL | Freq: Two times a day (BID) | OROMUCOSAL | Status: AC

## 2012-10-27 MED ORDER — OSMOLITE 1.2 CAL PO LIQD
1000.0000 mL | ORAL | Status: DC
  Filled 2012-10-27 (×2): qty 1000

## 2012-10-27 MED ORDER — LORAZEPAM 2 MG/ML IJ SOLN: 1.0000 mg | INTRAMUSCULAR | Status: AC | PRN

## 2012-10-27 MED ORDER — METOPROLOL TARTRATE 25 MG/10 ML ORAL SUSPENSION: 25.0000 mg | Freq: Two times a day (BID) | ORAL | Status: AC

## 2012-10-27 MED ORDER — LORAZEPAM 2 MG/ML IJ SOLN: 1.0000 mg | INTRAMUSCULAR | Status: DC | PRN

## 2012-10-27 MED ORDER — BIOTENE DRY MOUTH MT LIQD: 15.0000 mL | Freq: Two times a day (BID) | OROMUCOSAL | Status: AC

## 2012-10-27 MED ORDER — MORPHINE SULFATE 2 MG/ML IJ SOLN: 1.0000 mg | INTRAMUSCULAR | Status: AC | PRN

## 2012-10-27 NOTE — Discharge Summary (Signed)
   Physician Discharge Summary  Patient ID: Sally Nelson MRN: [id number] DOB/AGE: Oct 19, 1985 27 y.o.  Admit date: 09/11/2012 Discharge date: 10/27/2012  Admission Diagnoses: Subarachnoid Hemorrhage  Discharge Diagnoses: Same Principal Problem:   SAH (subarachnoid hemorrhage) Active Problems:   Acute respiratory failure   HTN (hypertension)   Hypokalemia   Hypophosphatemia   Tracheostomy status   HCAP (healthcare-associated pneumonia)   Obstructive atelectasis Intraventricular Hemorrhage Right MCA stroke Aneurysm Re-hemorrhage Communicating hydrocephalus  Discharged Condition: Stable  Hospital Course:  [name] is a 27 y.o. female initially presented with subarachnoid hemorrhage. She underwent diagnostic cerebral angiogram demonstrating a right middle cerebral artery aneurysm, as well as a right M2 occlusion. She was taken to the operating room for surgical clipping of the right middle cerebral artery aneurysm. Postoperatively she developed a large right-sided neural cerebral artery stroke requiring urgent decompressive craniectomy. She had a ventriculostomy placed during the operative procedure. Subsequent to this, she tolerated clamping of the ventricular drain and this was discontinued several days later. Within hours of discontinuation, she developed a massive ventricular hemorrhage. This required a left-sided ventricular catheter. Her hospital course subsequent to this was not complicated by vasospasm, although she did have intermittent episodes of pneumonia. Ultimately, the ventricular hemorrhage resolved, and she had a left-sided ventriculoperitoneal shunt placed. 4 days after shunt placement, she developed a spontaneous right intraparenchymal temporal hemorrhage with large ventricular extension. Diagnostic cerebral angiogram was repeated which demonstrated aneurysm recurrence, as well as a large distal right middle cerebral artery pseudoaneurysm in relation to the  ventricular catheter that was placed in medially after the re\re hemorrhage. She was again monitored in the intensive care unit with ventricular drainage, however approximately 2 weeks later the ventricular catheter on the right stopped functioning it was this discontinued, and serial CAT scan demonstrated progressive ventriculomegaly. Multiple attempts were then made to place another catheter on the left side which were unsuccessful. CAT scan demonstrated another large ventricular hemorrhage primarily on the left side with significant right-sided edema and midline shift. Another attempt was made to place ventricular catheter which resulted in rapid clotting of the catheter. At this point, her neurologic exam worsened where she was no longer following commands, had no responses to noxious stimuli. A long discussion was had with her father regarding her poor long-term prognosis, and he elected to change her CODE STATUS, and did not wish to proceed with any further invasive procedures. The palliative care service was consulted and she is now to be transferred to a hospice facility.   Treatments: Surgery -  1.Diagnostic cerebral angiogram     2. Clipping of right middle cerebral artery aneurysm     3. Decompressive right craniectomy     4. Placement of External ventiruclar drains     5. Left ventriculoperitoneal shunt     6. PEG Placement     7. Tracheostomy placement  Discharge Exam: Blood pressure 148/93, pulse 79, temperature 98 F (36.7 C), temperature source Axillary, resp. rate 27, height 5\' 1"  (1.549 m), weight 51.4 kg (113 lb 5.1 oz), SpO2 98.00%.  Eyes open Pupils non-reactive No response to noxious stimuli  Disposition: Transfer to Hospice facility     Medication List    Notice   You have not been prescribed any medications.       SignedLisbeth Nelson, C 10/27/2012, 2:54 PM

## 2012-10-27 NOTE — Progress Notes (Signed)
Nutrition Brief Note  Chart reviewed. Pt now transitioning to comfort care. TF's being decreased with goal of being turned off per Palliative care note.  No further nutrition interventions warranted at this time.  Please re-consult as needed.   Kendell Bane RD, LDN, CNSC [phone] Pager [phone] After Hours Pager

## 2012-10-27 NOTE — Progress Notes (Signed)
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 PCCM continuing to follow for post op vent management.  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 9/15 Echo >> EF 60 to 65%, grade 2 diastolic dysfx, mod/severe MR, mod PS 9/25 Doppler legs >> no DVT 10/1 Head CT >> Increased hydrocephalus, persistent herniation of Rt frontal/temporal lobes, mild intraventricular hemorrhage 10/13 Head CT >> Re-hemorrhage of aneurysm, increased hydrocephalus 10/14 Cebral angiogram >> Recurrence of Rt M2 aneurysm, Rt M4 pseudoaneurysm 10/20 Head CT >> partial decompression of ventricles 10/21 Doppler legs >> no DVT 10/21 Doppler Arms >> Superficial thrombosis Lt basilic vein, no DVT 10/24 CT head >> Rt hemispheric encephalomalacia with herniation  through craniectomy site, Rt frontal ventriculostomy catheter appears to stop short of the ventricle, persistent hydrocephalus, stable SDH and Lt frontal ventriculostomy catheter.   LINES / TUBES: ETT 9/12 >>9/23  L [place]  9/12 >>>10/9 Ventriculostomy 9/17 >>>10/9 G tube 9/23 >>  Tracheostomy 9/23 >> VP shunt 10/9 >>  R frontal ventric drain 10/13 >>    CULTURES: 9/22 Urine >>> E. Coli sensitive to Zosyn 10/08 Urine>>> E. Coli  10/20 Urine >> negative 10/20 CSF culture >> negative 10/20 Blood >> negative 10/20 Sputum >> Enterobacter asburiae  ANTIBIOTICS: Cipro 10/10>>>10/11 Cefazolin 10/11 >> 10/16 Elita Quick 10/22 >>  SUBJECTIVE:   Palliative care to meet with family.  VITAL SIGNS: Temp:  [98 F (36.7 C)-98.8 F (37.1 C)] 98 F (36.7 C) (10/26 2000) Pulse Rate:  [66-95] 74 (10/27 0800) Resp:  [23-33] 25 (10/27 0800) BP: (130-163)/(69-100) 149/89 mmHg (10/27 0800) SpO2:  [97 %-100 %] 100 % (10/27 0800) FiO2 (%):  [28 %] 28 % (10/27 0800) Trach collar 28%  INTAKE / OUTPUT: Intake/Output     10/26 0701 - 10/27 0700 10/27 0701 - 10/28 0700   I.V. (mL/kg) 960 (18.7) 40 (0.8)   NG/GT 1440 60   IV Piggyback 200    Total Intake(mL/kg) 2600 (50.6) 100 (1.9)   Urine (mL/kg/hr)     Stool     Total Output       Net +2600 +100        Urine Occurrence 5 x 1 x    PHYSICAL EXAMINATION: Gen: No distress Neuro: opens eyes with stimulation HEENT: trach site clean PULM: coarse breath sounds, no wheeze CV: regular, tachycardic, 2/6 SM AB: soft, non tender, G tube site clean Ext: No edema  Labs: CBC Recent Labs     10/25/12  0419  WBC  13.4*  HGB  9.4*  HCT  26.5*  PLT  203    BMET Recent Labs     10/25/12  0419  10/26/12  0535  NA  131*  126*  K  4.1  4.1  CL  92*  87*  CO2  30  28  BUN  13  13  CREATININE  0.20*  <0.20*  GLUCOSE  135*  154*    Electrolytes Recent Labs     10/25/12  0419  10/26/12  0535  CALCIUM  9.6  9.9    Recent Labs     10/25/12  0823  10/25/12  1716  10/25/12  2340  10/26/12  0815  10/26/12  1544  10/27/12  0746  GLUCAP  127*  127*  142*  159*  163*  159*    Imaging Ct Head Wo Contrast  10/25/2012   CLINICAL DATA:  Increased pressure for following drain placement. Craniectomy with decompression.  EXAM: CT HEAD WITHOUT CONTRAST  TECHNIQUE: Contiguous axial images  were obtained from the base of the skull through the vertex without intravenous contrast.  COMPARISON:  10/25/2012.  FINDINGS: Left frontal VP shunt was previously seen within the ventricle (10/24/2012). The right frontal surgical drain terminating adjacent to the frontal horn of the right lateral ventricle is no longer present. VP shunt has been disconnected and the intracranial portion appears to have been removed with placement of a new ventriculostomy tube that terminates in the periventricular white matter adjacent to the body of the left lateral ventricle.  Cerebral edema has increased with greater or effacement of the sulci. This is more prominent on the right than left. There is herniation of the right frontal lobe through craniectomy. Right temporal parenchymal hemorrhage appears similar in size.  Midline shift to the right has increased now measuring 1 cm with subfalcine herniation. Blood layers along the tentorium, greater on the right than left. Subdural blood in the right occipital and right parietal regions appears roughly similar.  IMPRESSION: 1. Adjustment/revision of ventriculostomy and VP shunt. VP shunt has been removed and the new ventriculostomy tubing is not within the ventricle, terminating in the left periventricular white matter adjacent to the body of the ventricle. 2. Increasing cerebral edema and left intraventricular blood. Left to right midline shift now measures about 1 cm and there is increased herniation of the right frontal lobe through the right frontal craniectomy. 3. Increasing hydrocephalus with casting of blood products in the left lateral ventricle.  Case discussed with the ICU nurse caring for the patient Marcos Eke, who was very helpful in clarifying the support changes.   Electronically Signed   By: Andreas Newport M.D.   On: 10/25/2012 20:02   Ct Head Wo Contrast  10/25/2012   CLINICAL DATA:  Status post ventricular drain removal.  EXAM: CT HEAD WITHOUT CONTRAST   TECHNIQUE: Contiguous axial images were obtained from the base of the skull through the vertex without intravenous contrast.  COMPARISON:  CT head without contrast 10/24/2012.  FINDINGS: The right ventriculostomy drainage catheter has been removed. Ventricles have increased in size since yesterday's exam. The 3rd ventricle now measures 15 mm in transverse diameter compared with 10 mm previously. Appearance is similar to the study of 10/20/2012. Brain tissue herniated through the craniectomy defect is similar to the prior study. The aneurysm clip is stable. Interventricular hemorrhage is not significantly changed. Extra-axial hemorrhage is similar as well.  The paranasal sinuses and mastoid air cells are clear.  IMPRESSION: 1. Increased hydrocephalus following removal of right ventriculostomy drainage catheter. 2. Similar appearance of diffuse encephalomalacia and herniation of brain tissue through the craniectomy defect. 3. Stable appearance of intraventricular and subdural hemorrhages.   Electronically Signed  By: Gennette Pac M.D.   On: 10/25/2012 16:29   Dg Chest Port 1 View  10/27/2012   CLINICAL DATA:  Pneumonia evaluation  EXAM: PORTABLE CHEST - 1 VIEW  COMPARISON:  10/25/2012  FINDINGS: Stable positioning of a tracheostomy tube. Left-sided ventriculoperitoneal shunt catheter is continuous where seen.  Unchanged heart size and morphology.  Airspace disease in the right lower lung is not significantly changed. No evidence of new effusion or pneumothorax. The left lung remains clear.  IMPRESSION: Stable appearance of right lower lung pneumonia. No evidence of cavitation or effusion.   Electronically Signed   By: Tiburcio Pea M.D.   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 Hydrocephalus. P: -per neurosurgery  PULMONARY A:  Acute respiratory failure in setting of SAH with inability to protect airway. Mucus  plug with hypoxia 10/23 >> continues to improve on CXR 10/25 with chest PT. S/p tracheostomy. Hx of Asthma. 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 Sinus tachycardia. P:  -continue lopressor to 25 mg bid per tube for now  RENAL A: Mild hyponatremia >> stable. P: -NS IV fluid -defer further lab test pending outcome of palliative care assessment  HEMATOLOGIC A:   Anemia, thrombocytopenia of critical illness. P:  -SCD for DVT prevention  INFECTIOUS A:  Aspiration PNA on admission >> resolved. E coli UTI >> completed Tx 10/16. New fever 10/19 with Enterobacter in sputum, and progressive RLL infiltrate >> HCAP. P:   -Day 6 of fortaz  ENDOCRINE A:   Relative adrenal insufficiency >> resolved. Steroid induced hyperglycemia >>resolved.   P:   -f/u blood sugar on BMET  Summary: Pt DNR.  Father requests that no further neurosurgical interventions be performed.  Palliative care to d/w family further goals of care.  Will defer further testing and follow up pending outcome of palliative care assessment.  Coralyn Helling, MD [facility] Pulmonary/Critical Care 10/27/2012, 8:57 AM Pager:  [phone] After 3pm call: [phone]

## 2012-10-27 NOTE — Consult Note (Signed)
Patient Sally Nelson      DOB: Jan 13, 1985      UXL:[id number]     Consult Note from the Palliative Medicine Team at [facility]    Consult Requested by:   Dr Conchita Paris     PCP: No primary provider on file. 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  Patient has been minimally responsive since admission, today her father makes the decision to shift to full comfort understanding her poor prognosis    This NP Lorinda Creed reviewed medical records, received report from team, assessed the patient and then meet at the patient's bedside along with her father Mr Metta Clines  to discuss diagnosis prognosis, GOC, EOL wishes disposition and options.   A detailed discussion was had today regarding advanced directives.  Concepts specific to code status, artifical feeding and hydration, continued IV antibiotics and rehospitalization was had.  The difference between a aggressive medical intervention path  and a palliative comfort care path for this patient at this time was had.  Values and goals of care important to patient and family were attempted to be elicited.  Concept of Hospice and Palliative Care were discussed.  He reports close attachment to [facility], where his mother died several years ago.  Natural trajectory and expectations at EOL were discussed.  Questions and concerns addressed. Family encouraged to call with questions or concerns.  PMT will continue to support holistically.    Goals of Care: 1.  Code Status: DNR/DNI-comfort is main focus of care   2. Scope of Treatment: 1. Vital Signs: daily  2. Respiratory/Oxygen:for comfort only 3. Nutritional Support/Tube Feeds:begin to wean down with goal to off  4. Antibiotics:  discontinue 5. Blood Products:none 6. IVF:KVO for medications only 7. Review of Medications to be discontinued: minimize for comfort 8. Labs: none 9. Telemetry:none 10. Consults:no further medical intervention  Or diagnositcs   4. Disposition: Hopeful for residential hospice   3. Symptom Management:   1. Anxiety/Agitation/Seizure: Ativan 1 mg every 2 hrs prn 2. Pain/Dyspnea: Morphine 1 mg every 1 hr prn  4. Psychosocial:  Emotional support offered to father.  He has experienced tremendous level of loss and grief over the past several years.  His son was killed in a care accident and Rilynne's mother was killed after being hit by a car.  5. Spiritual:  Father report patient had a strong connection to her church in [place], [place] [employer].  He feels very supported by the hospital staff.    Brief HPI: Aneta Hendershott is an 27 y.o. 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 Patient was taken for emergent craniectomy for decompression in setting of intracranial hypertension. Treatments included concentrated saline, trach, PEG. She was kept on anticonvulsants, vasospasm prophylaxis and TCDs q MWF. Treated for pneumonia during this time frame.  On 09/17, she underwent left frontal ventriculostomy after having decreased mental status while CT head demonstrated acute IVH and hydrocephalus after removal of previous ventricular catheter. Around 09/23/2012 she had episode of distal LICA  and MCA/ACA vasospasm which was responsive to hypertension and hypervolemia. It was felt that her hypovolemia was related to bleeding after PEG; she was responsive to transfusion, IVFs, Levophed.  On 10/01 CT head showed increasing hydrocephalus with persistent  herniation of right frontal and temporal lobes in setting of mild IVH. Due to persistent hydrocephalus, VP shunt was placed on 10/09/2012. Since that time, the patient had been awake, following commands with right body, showed thumbs up and down to simple questions. She was being evaluated for possible CIR.  On 10/13/2012 a nurse reported seizure like activity, followed by decreased mental status. Father stated that the patient projectile vomited, "a lot". CT head was ordered immediately and showed intracranial hemorrhage, intraventricular hemorrhage, hydrocephalus. The patient went back to the OR and underwent placement of a right  frontal ventriculostomy shunt. 10-25-14Mrs. [place] is a 27 y.o. female who earlier tonight underwent attempted left frontal external ventricular drain placement. CT scan was done which demonstrated the catheter to be lateral to the left lateral ventricle, and its tract appeared to be deflected in the intraparenchymal compartment. Also noted was a new acute left ventricular hemorrhage with casting.  I spoke in detail with the patient's father regarding the unsuccessful attempt and the CT findings. I explained to him that a repeat attempt does carry the risk of further ventricular and or parenchymal hemorrhage, however she is drainage dependent and requires CSF diversion eventually. He understood the difficult situation, and consented to repeat attempt at left frontal external ventricular drain placement.     ROS: unable to illicit   PMH:  Past Medical History  Diagnosis Date  . Heart murmur   . Asthma      PSH: Past Surgical History  Procedure Laterality Date  . No past surgeries    . Radiology with anesthesia N/A 09/11/2012    Procedure: RADIOLOGY WITH ANESTHESIA-CEREBRAL ANEURYSM COILING;  Surgeon: Medication Radiologist, MD;  Location: [facility] OR;  Service: Radiology;  Laterality: N/A;  . Craniotomy Right 09/11/2012    Procedure: CRANIOTOMY FOR CLIPPING OF   ANEURYSM ;  Surgeon: Lisbeth Renshaw, MD;  Location: [facility] NEURO ORS;  Service: Neurosurgery;  Laterality: Right;  . Craniotomy Right 09/12/2012    Procedure: Right decompressive craniectomy and placement of boneflap in right lower quadrant;  Surgeon: Lisbeth Renshaw, MD;  Location: [facility] NEURO ORS;  Service: Neurosurgery;  Laterality: Right;  . Ventriculoperitoneal shunt N/A 10/09/2012    Procedure: SHUNT INSERTION VENTRICULAR-PERITONEAL;  Surgeon: Lisbeth Renshaw, MD;  Location: [facility] NEURO ORS;  Service: Neurosurgery;  Laterality: N/A;  . Laparoscopic revision ventricular-peritoneal (v-p) shunt N/A 10/09/2012    Procedure: LAPAROSCOPIC REVISION VENTRICULAR-PERITONEAL (V-P) SHUNT;  Surgeon: Adolph Pollack, MD;  Location: [facility] NEURO ORS;  Service: General;  Laterality: N/A;   I have reviewed the FH and SH and  If appropriate update it with new information. Allergies  Allergen Reactions  . Contrast Media [Iodinated Diagnostic Agents] Hives   Scheduled Meds: . antiseptic oral rinse  15 mL Mouth Rinse q12n4p  . chlorhexidine  15 mL Mouth Rinse BID  . collagenase   Topical Daily  . feeding supplement (OSMOLITE 1.2 CAL)  1,000 mL Per Tube Q24H  . levalbuterol  0.63 mg Nebulization Q6H WA  . levETIRAcetam  1,000 mg Per Tube Q12H  . magic mouthwash  5 mL Oral TID  . metoprolol tartrate  25 mg Per Tube BID  . midazolam  2 mg Intravenous Once  . pantoprazole sodium  40 mg Per Tube Q1200  Continuous Infusions: . sodium chloride 40 mL/hr at 10/27/12 0823   PRN Meds:.acetaminophen (TYLENOL) oral liquid 160 mg/5 mL, acetaminophen, artificial tears, labetalol, levalbuterol, loperamide, metoprolol, ondansetron (ZOFRAN) IV, polyvinyl alcohol    BP 149/89  Pulse 74  Temp(Src) 98 F (36.7 C) (Axillary)  Resp 25  Ht 5\' 1"  (1.549 m)  Wt 51.4 kg (113 lb 5.1 oz)  BMI 21.42 kg/m2  SpO2 99%   PPS:20 %   Intake/Output Summary (Last 24 hours) at 10/27/12 0935 Last data filed at 10/27/12 0800  Gross  per 24 hour  Intake   2500 ml  Output      0 ml  Net   2500 ml     Physical Exam:  General: ill appearing, NAD HEENT:  Mm, no exudate Chest:   Diminished in bases, scattered coarse BS CVS: RRR Abdomen:soft NT +BS Ext: without edema Neuro:eyes open, no tracking, unable to follow commands  Labs: CBC    Component Value Date/Time   WBC 13.4* 10/25/2012 0419   RBC 3.22* 10/25/2012 0419   HGB 9.4* 10/25/2012 0419   HCT 26.5* 10/25/2012 0419   PLT 203 10/25/2012 0419   MCV 82.3 10/25/2012 0419   MCH 29.2 10/25/2012 0419   MCHC 35.5 10/25/2012 0419   RDW 15.9* 10/25/2012 0419   LYMPHSABS 2.8 10/21/2012 0430   MONOABS 0.9 10/21/2012 0430   EOSABS 0.0 10/21/2012 0430   BASOSABS 0.0 10/21/2012 0430    BMET    Component Value Date/Time   NA 126* 10/26/2012 0535   K 4.1 10/26/2012 0535   CL 87* 10/26/2012 0535   CO2 28 10/26/2012 0535   GLUCOSE 154* 10/26/2012 0535   BUN 13 10/26/2012 0535   CREATININE <0.20* 10/26/2012 0535   CALCIUM 9.9 10/26/2012 0535   GFRNONAA NOT CALCULATED 10/26/2012 0535   GFRAA NOT CALCULATED 10/26/2012 0535    CMP     Component Value Date/Time   NA 126* 10/26/2012 0535   K 4.1 10/26/2012 0535   CL 87* 10/26/2012 0535   CO2 28 10/26/2012 0535   GLUCOSE 154* 10/26/2012 0535   BUN 13 10/26/2012 0535   CREATININE <0.20* 10/26/2012 0535   CALCIUM 9.9 10/26/2012 0535   PROT 7.7 10/21/2012 0430   ALBUMIN 3.0* 10/21/2012 0430   AST 51* 10/21/2012 0430   ALT 71* 10/21/2012 0430   ALKPHOS 110 10/21/2012 0430   BILITOT 0.3 10/21/2012 0430   GFRNONAA NOT CALCULATED 10/26/2012 0535   GFRAA NOT CALCULATED 10/26/2012 0535       Time In Time Out Total Time Spent with Patient Total Overall Time  0830 1000 80 min 90 min    Greater than 50%  of this time was spent counseling and coordinating care related to the above assessment and plan.  Lorinda Creed NP  Palliative Medicine Team Team Phone # [phone] Pager [phone]  Dicussed with Dr  Conchita Paris and Dr Craige Cotta and Cheri Rous LCSW

## 2012-10-27 NOTE — Clinical Social Work Note (Signed)
Clinical Social Worker facilitated patient discharge including contacting patient family and facility to confirm patient discharge plans.  Clinical information faxed to facility and family agreeable with plan.  CSW arranged ambulance transport via [facility] to [facility] .  RN to call report prior to discharge.  CSW spoke to patient father regarding housing options - patient father to follow up.  Clinical Social Worker will sign off for now as social work intervention is no longer needed. Please consult [place] again if new need arises.  Macario Golds, [place] [phone]

## 2012-11-03 ENCOUNTER — Encounter (HOSPITAL_COMMUNITY): Payer: Self-pay | Admitting: *Deleted

## 2012-11-11 NOTE — Consult Note (Signed)
I have reviewed and discussed the care of this patient in detail with the nurse practitioner including pertinent patient records, physical exam findings and data. I agree with details of this encounter.  

## 2012-12-01 DEATH — deceased

## 2014-02-21 IMAGING — CT CT HEAD W/O CM
1 series · 15 of 29 positions shown, 19 images · non-contrast
Comparison: Multiple priors, most recent 10/10/2012.

CLINICAL DATA: Sudden onset of seizures and decreased level of
consciousness.

EXAM:
CT HEAD WITHOUT CONTRAST
TECHNIQUE: Contiguous axial images were obtained from the base of the skull
through the vertex without contrast.

[Series 2: head 5.0 h30s · axial · 0.40mm/px · z∈[+799,+929]mm · 15 of 29 slices shown, 19 images]
[im 2/29  brain]
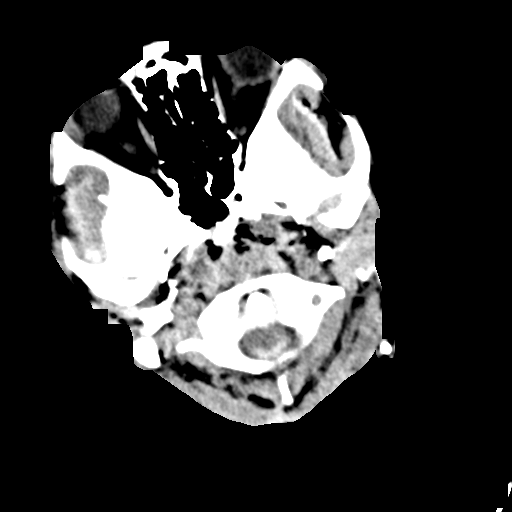
[im 2/29  bone]
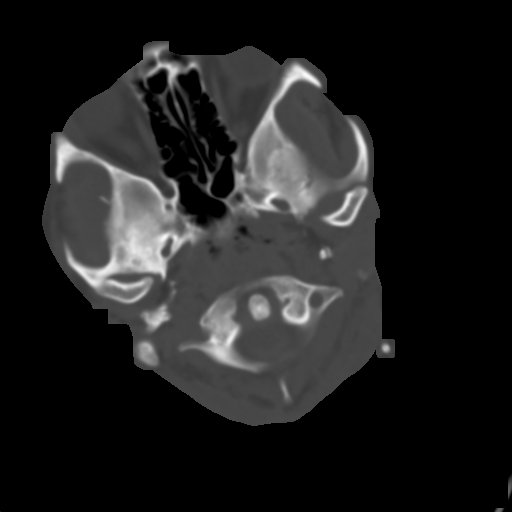
[im 4/29  brain]
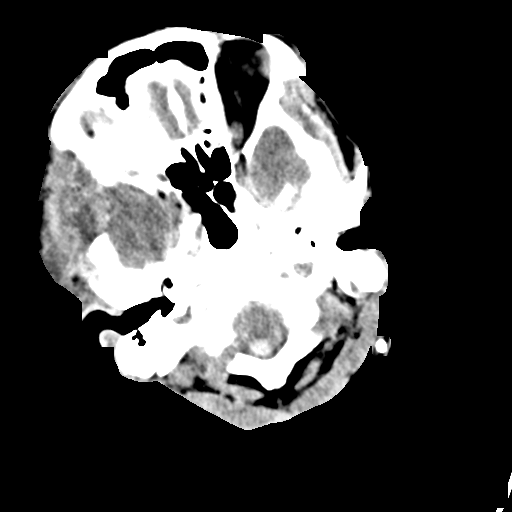
[im 6/29  brain]
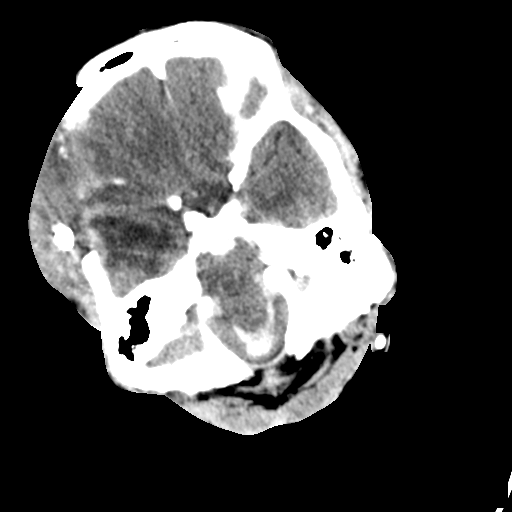
[im 8/29  brain]
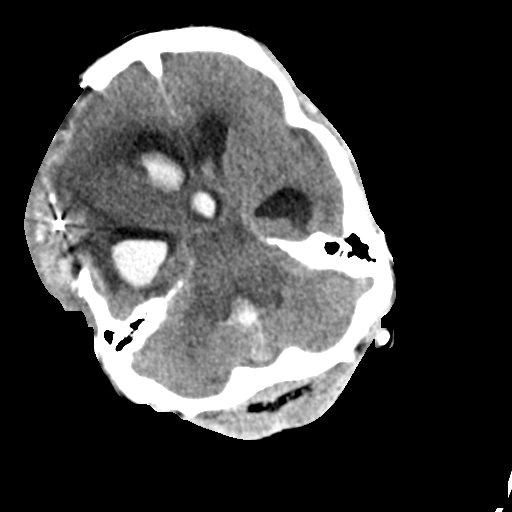
[im 10/29  brain]
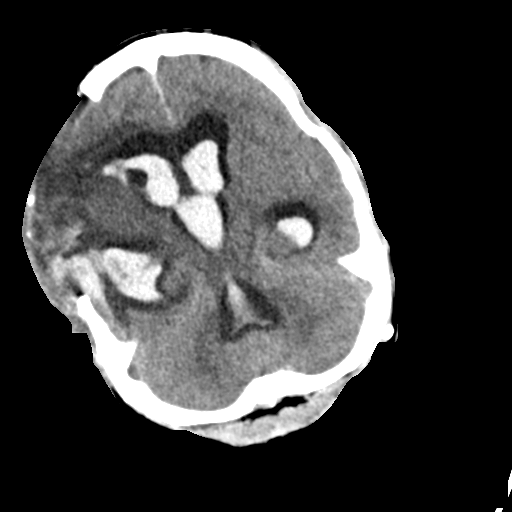
[im 10/29  bone]
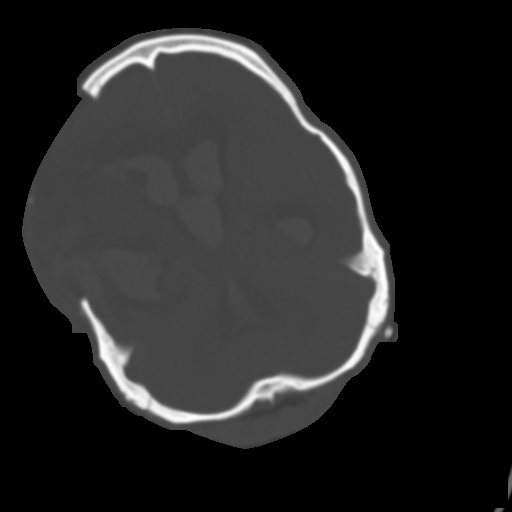
[im 11/29  brain]
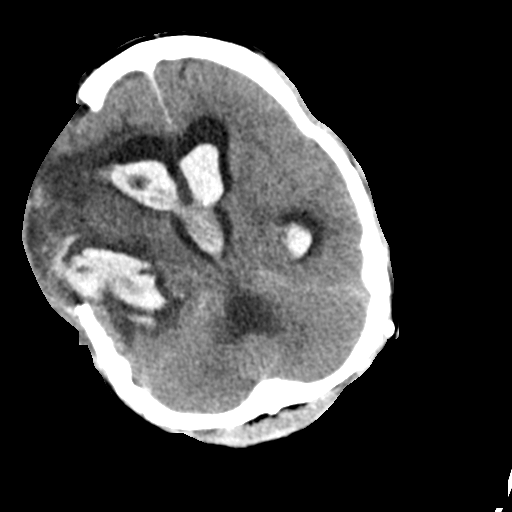
[im 13/29  brain]
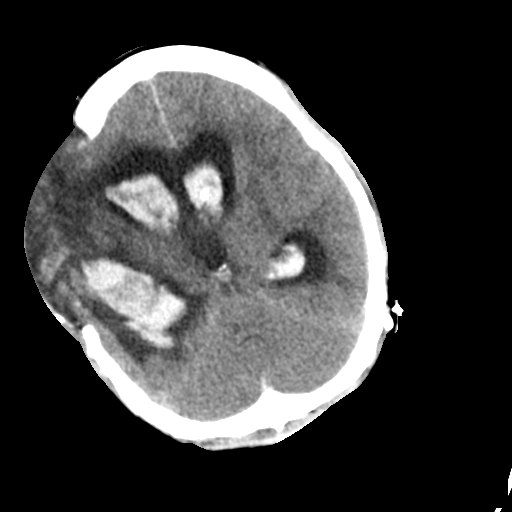
[im 15/29  brain]
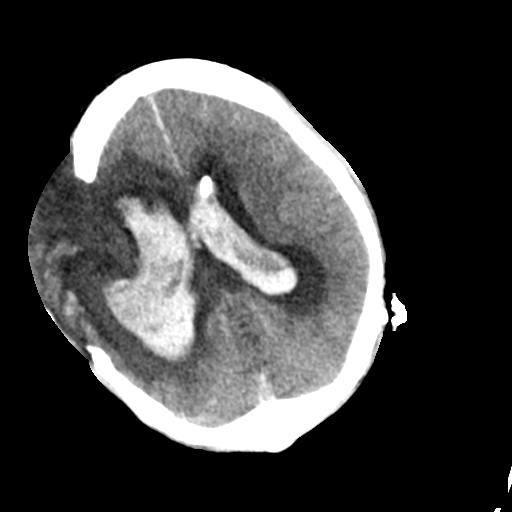
[im 17/29  brain]
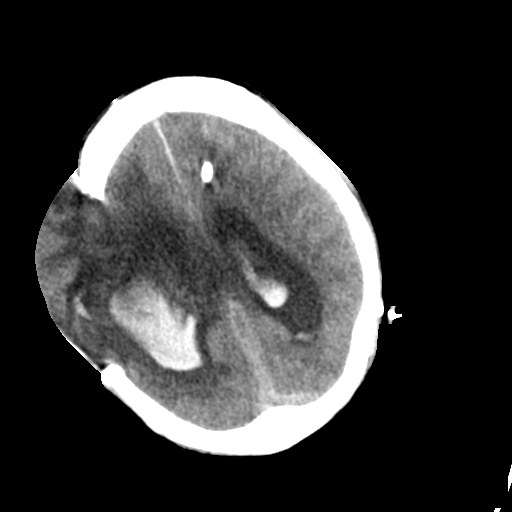
[im 17/29  bone]
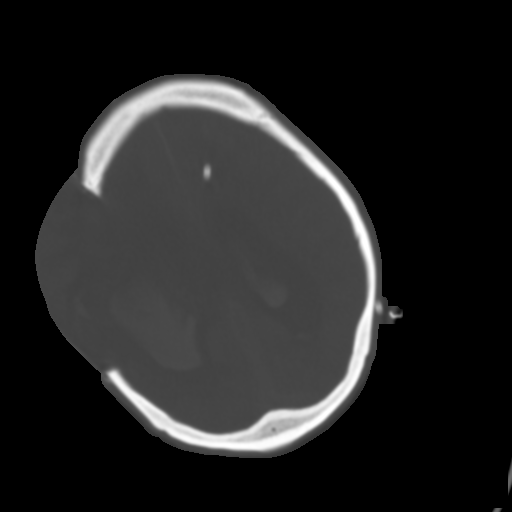
[im 19/29  brain]
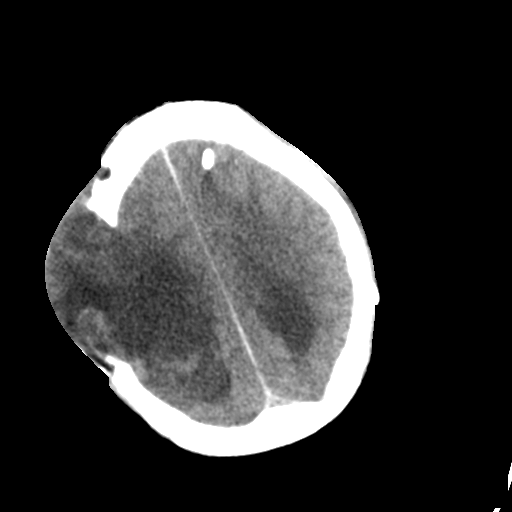
[im 20/29  brain]
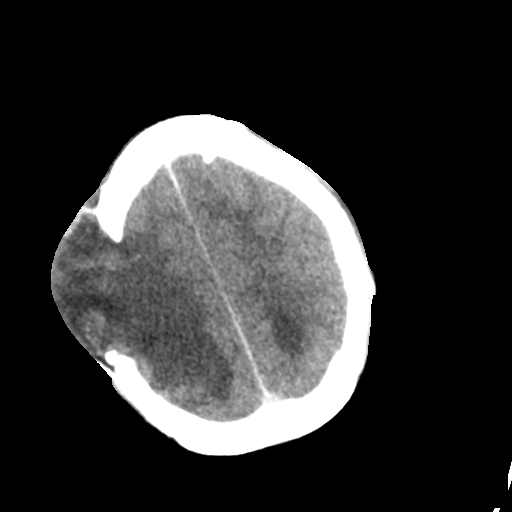
[im 22/29  brain]
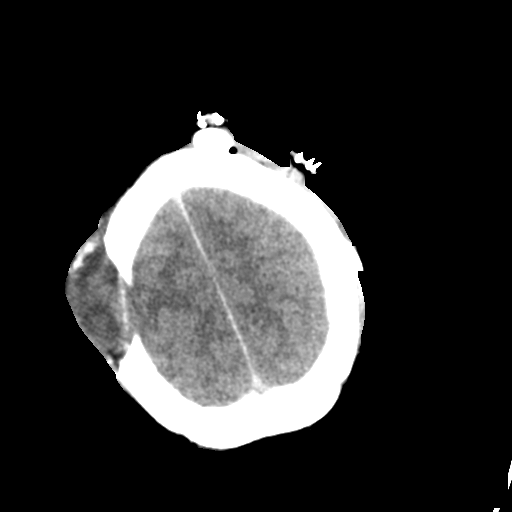
[im 24/29  brain]
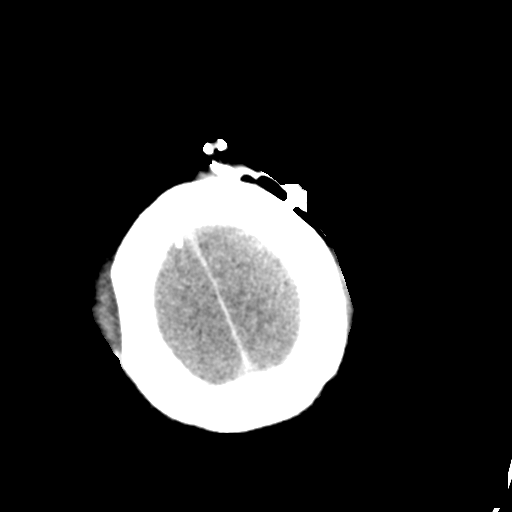
[im 24/29  bone]
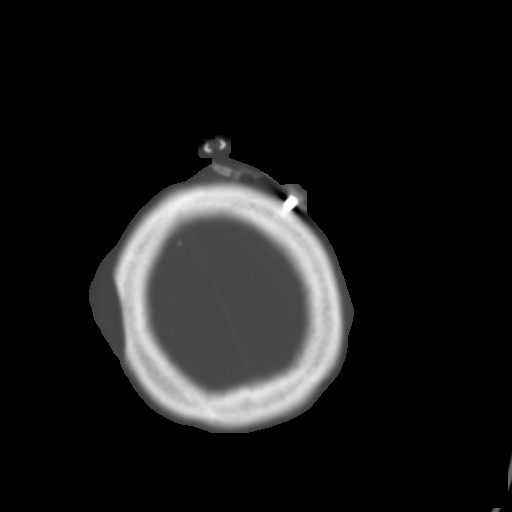
[im 26/29  brain]
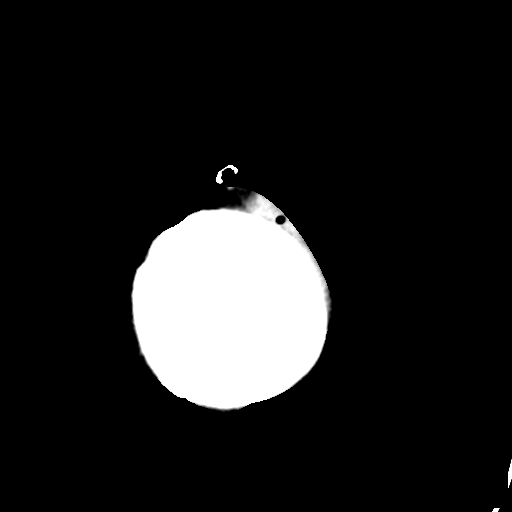
[im 28/29  brain]
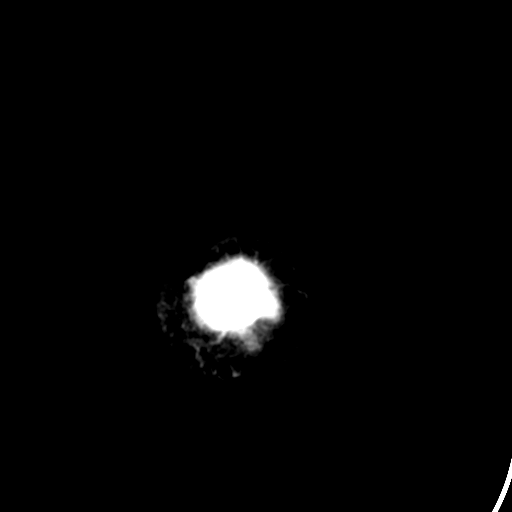

[15 of 29 positions shown; findings below may reference images not displayed]

FINDINGS: The patient has undergone clipping of right middle cerebral artery
aneurysm, with subsequent right hemisphere MCA territory infarction.
Due to brain swelling, the craniotomy flap was removed, and the
infarcted brain has herniated through the defect. More recently,
there was placement of a ventricular peritoneal shunt for
hydrocephalus via a left frontal approach.

Today's examination reveals marked intracerebral and
intraventricular hemorrhage originating in the right temporal lobe.
The ventricular system demonstrates marked increase in size. The
ventricular system is a cast of blood. There is marked outward
bulging of the brain due to the parenchymal hemorrhage The findings
are consistent with aneurysmal re-hemorrhage from the right MCA
aneurysm.

Review of the prior MRI from [facility] [REDACTED] from 1777
revealed no evidence for MCA aneurysm that time. It is likely the
MCA aneurysm was mycotic in origin, not a berry aneurysm based on
this finding, as well as its angiographic appearance.
IMPRESSION: Aneurysmal re-hemorrhage with marked intraventricular blood and
increased hydrocephalus. Increased mass effect outward from
parenchymal hemorrhage.

These results were called by telephone at the time of interpretation
on 10/13/2012 at [DATE] to Dr. RENSO ROD , who verbally
acknowledged these results.

## 2014-11-10 IMAGING — CT CT HEAD WITHOUT CONTRAST
1 series · 15 of 28 positions shown, 19 images · non-contrast
Comparison: none

REASON FOR EXAM: ha
COMMENTS:

PROCEDURE:     CT  - CT HEAD WITHOUT CONTRAST  - September 11, 2012 [DATE]
RESULT:     Head CT dated 09/11/2012
TECHNIQUE: Helical noncontrasted 5 mm sections were obtained from skull base
to the vertex.

[Series 2: soft tissue · axial · 0.42mm/px · z∈[+410,+534]mm · 15 of 28 slices shown, 19 images]
[im 2/28  brain]
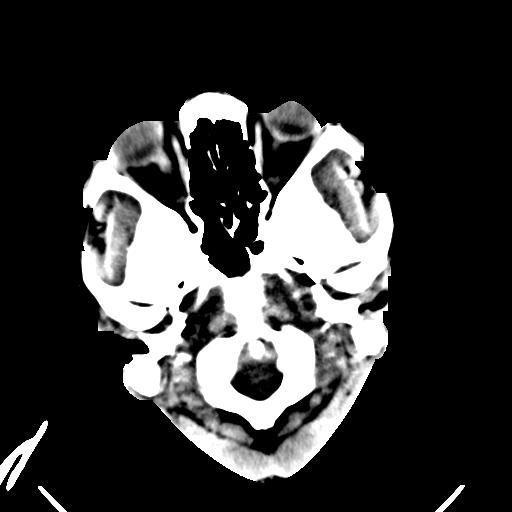
[im 2/28  bone]
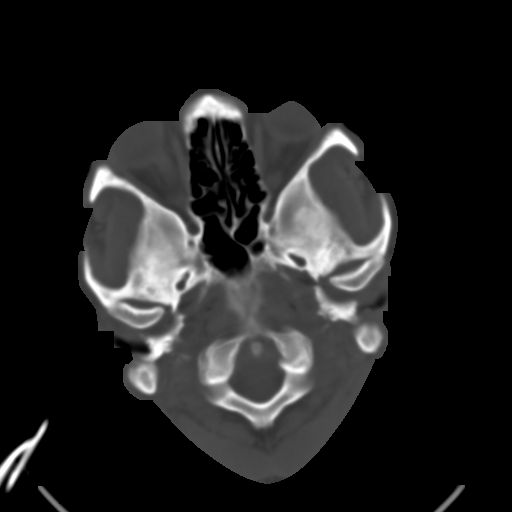
[im 4/28  brain]
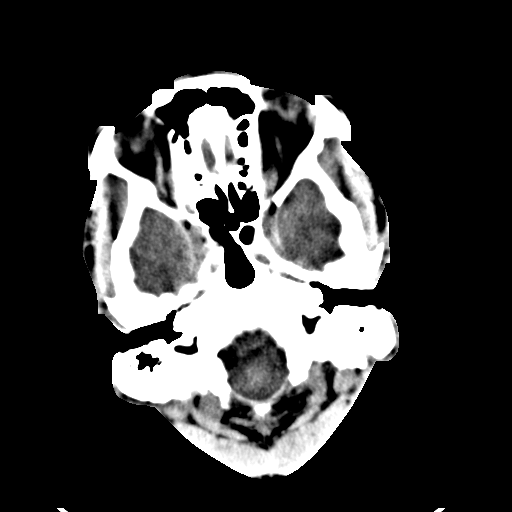
[im 6/28  brain]
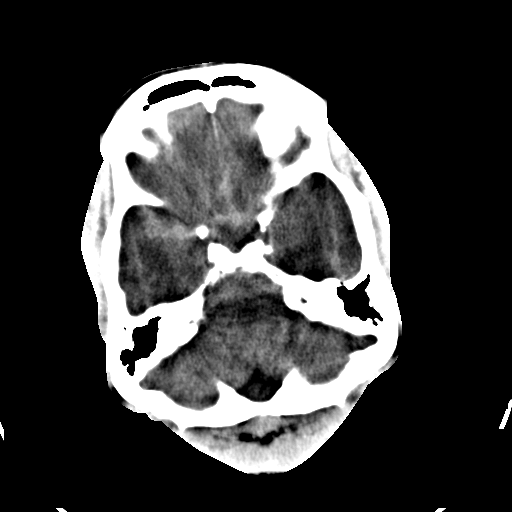
[im 8/28  brain]
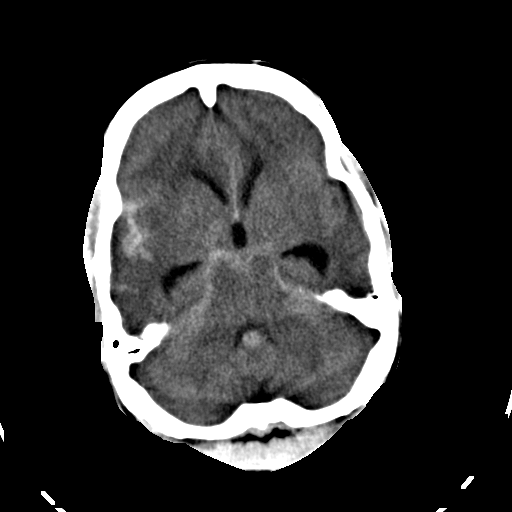
[im 9/28  brain]
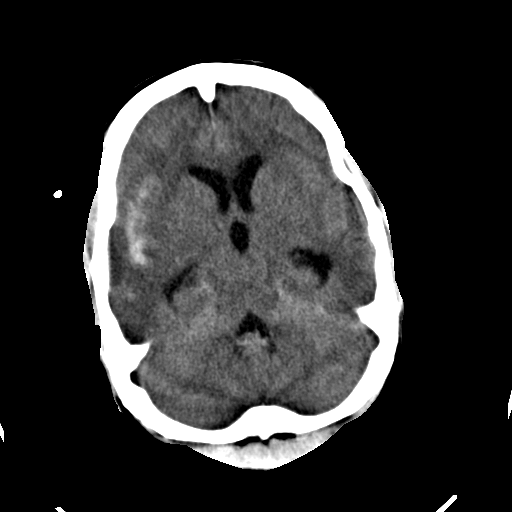
[im 9/28  bone]
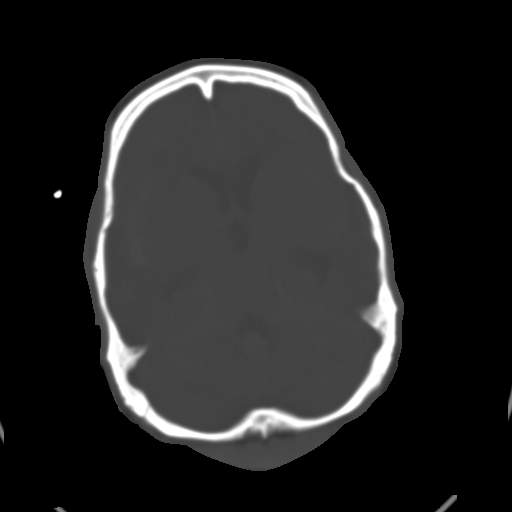
[im 11/28  brain]
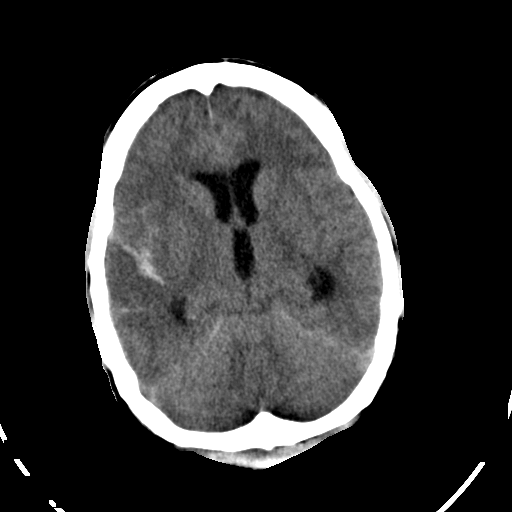
[im 13/28  brain]
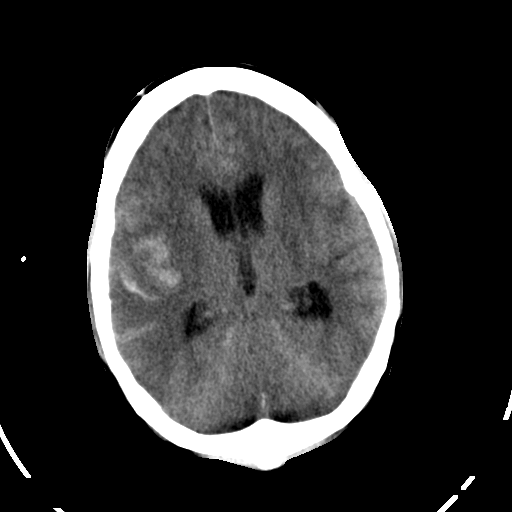
[im 15/28  brain]
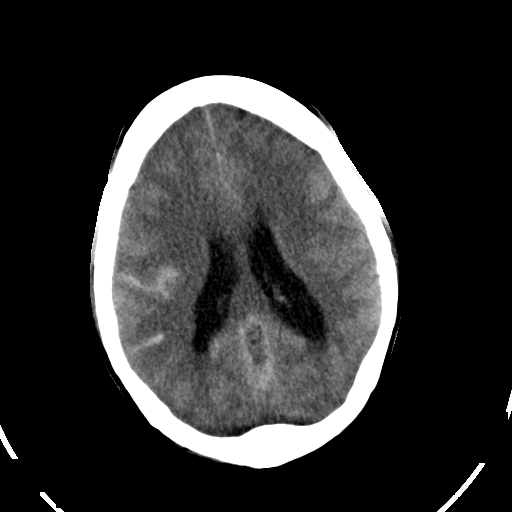
[im 16/28  brain]
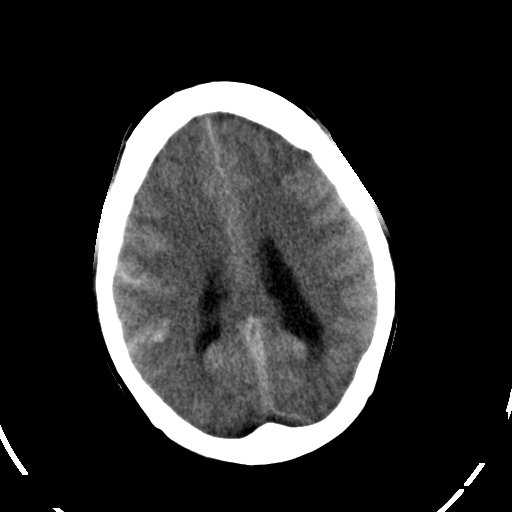
[im 16/28  bone]
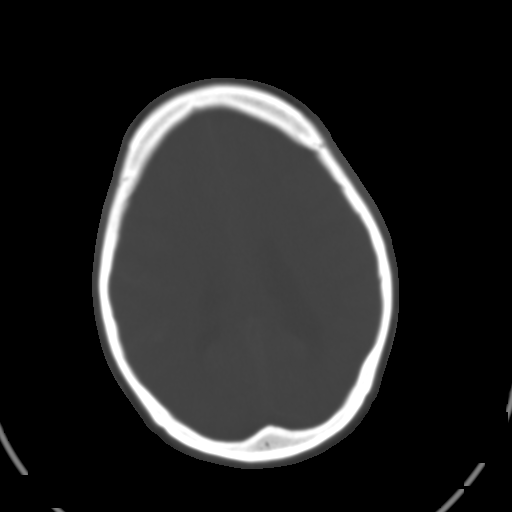
[im 18/28  brain]
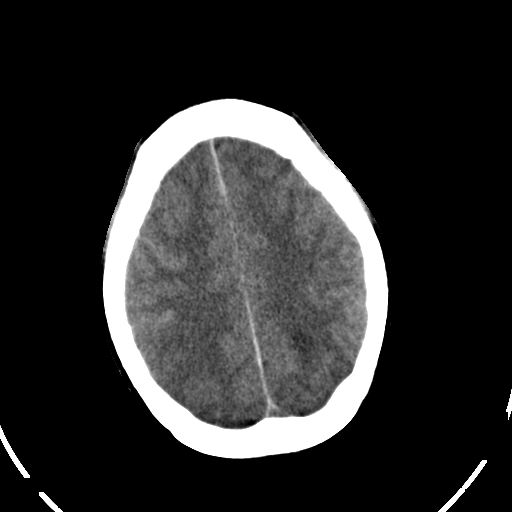
[im 20/28  brain]
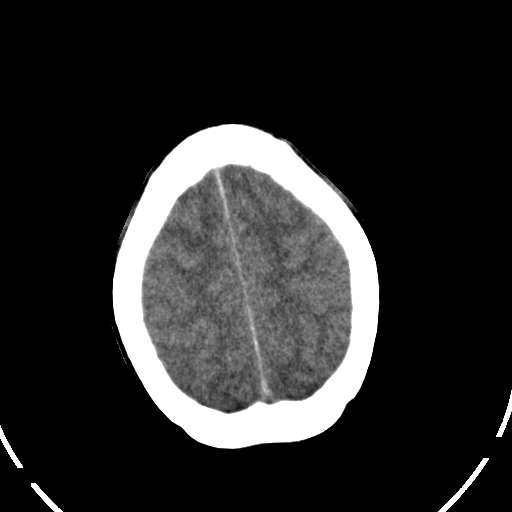
[im 21/28  brain]
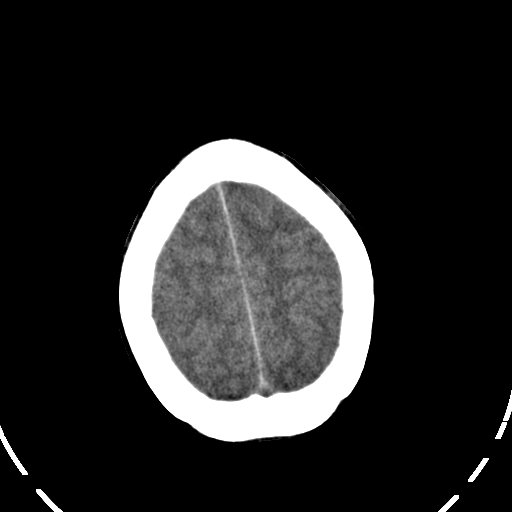
[im 23/28  brain]
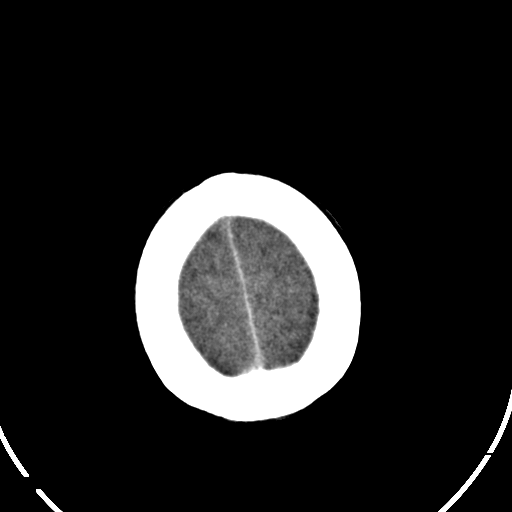
[im 23/28  bone]
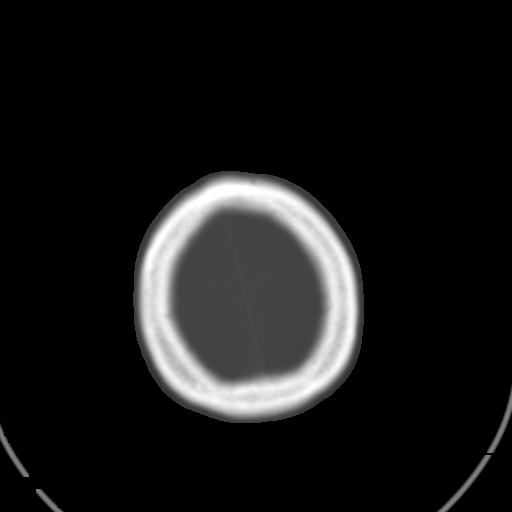
[im 25/28  brain]
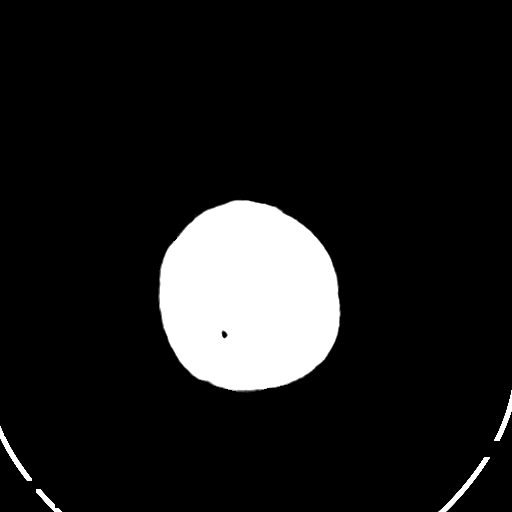
[im 27/28  brain]
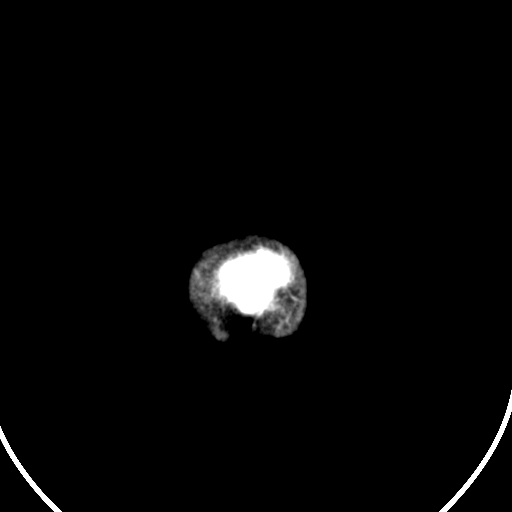

[15 of 28 positions shown; findings below may reference images not displayed]

FINDINGS: Areas of increased attenuation identified within the sulci of the
right frontoparietal region extending into the temporal lobe. An area of
focal increased attenuation projects within the region of the right middle
cerebral artery. There is no evidence of subfalcine or tonsillar herniation.
There is effacement of the sulci. The ventricles and cisterns are patent.
There does not appear to be evidence of intraventricular extension. The
osseous structures demonstrate no evidence of a depressed skull fracture.
The visualized paranasal sinuses and mastoid air cells are patent.
IMPRESSION: Findings consistent with a subarachnoid hemorrhage. This
raises concern of aneurysm rupture possibly right MCA distribution.
2. Dr. Mustafayeva of the emergency department was informed of these findings
at the time of initial interpretation.
3. There is no evidence of mass effect nor appreciable intraventricular
extension.
